# Patient Record
Sex: Female | Born: 1935
Health system: Southern US, Community
[De-identification: ages and names within clinical notes are randomized; demographics above are authoritative.]

## PROBLEM LIST (undated history)

## (undated) DIAGNOSIS — E039 Hypothyroidism, unspecified: Secondary | ICD-10-CM

## (undated) DIAGNOSIS — Z8659 Personal history of other mental and behavioral disorders: Secondary | ICD-10-CM

## (undated) DIAGNOSIS — I10 Essential (primary) hypertension: Secondary | ICD-10-CM

## (undated) DIAGNOSIS — R600 Localized edema: Secondary | ICD-10-CM

## (undated) DIAGNOSIS — F419 Anxiety disorder, unspecified: Secondary | ICD-10-CM

## (undated) HISTORY — PX: EYE SURGERY: SHX253

## (undated) HISTORY — PX: CHOLECYSTECTOMY: SHX55

## (undated) HISTORY — PX: ABDOMINAL HYSTERECTOMY: SHX81

---

## 1999-08-31 ENCOUNTER — Encounter: Admission: RE | Admit: 1999-08-31 | Discharge: 1999-08-31 | Payer: Self-pay | Admitting: Family Medicine

## 1999-08-31 ENCOUNTER — Encounter: Payer: Self-pay | Admitting: Family Medicine

## 2000-09-25 ENCOUNTER — Encounter: Payer: Self-pay | Admitting: Family Medicine

## 2000-09-25 ENCOUNTER — Encounter: Admission: RE | Admit: 2000-09-25 | Discharge: 2000-09-25 | Payer: Self-pay | Admitting: Family Medicine

## 2000-11-18 ENCOUNTER — Other Ambulatory Visit: Admission: RE | Admit: 2000-11-18 | Discharge: 2000-11-18 | Payer: Self-pay | Admitting: Gastroenterology

## 2000-11-18 ENCOUNTER — Encounter (INDEPENDENT_AMBULATORY_CARE_PROVIDER_SITE_OTHER): Payer: Self-pay | Admitting: Specialist

## 2001-11-21 ENCOUNTER — Encounter: Payer: Self-pay | Admitting: Family Medicine

## 2001-11-21 ENCOUNTER — Encounter: Admission: RE | Admit: 2001-11-21 | Discharge: 2001-11-21 | Payer: Self-pay | Admitting: Family Medicine

## 2004-04-12 ENCOUNTER — Other Ambulatory Visit: Admission: RE | Admit: 2004-04-12 | Discharge: 2004-04-12 | Payer: Self-pay | Admitting: Family Medicine

## 2004-09-13 ENCOUNTER — Ambulatory Visit: Payer: Self-pay | Admitting: Family Medicine

## 2004-09-20 ENCOUNTER — Ambulatory Visit: Payer: Self-pay | Admitting: Family Medicine

## 2004-09-22 ENCOUNTER — Ambulatory Visit: Payer: Self-pay | Admitting: Family Medicine

## 2004-09-28 ENCOUNTER — Ambulatory Visit: Payer: Self-pay | Admitting: Family Medicine

## 2004-10-31 ENCOUNTER — Ambulatory Visit: Payer: Self-pay | Admitting: Family Medicine

## 2004-12-21 ENCOUNTER — Ambulatory Visit: Payer: Self-pay | Admitting: Family Medicine

## 2005-04-13 ENCOUNTER — Ambulatory Visit: Payer: Self-pay | Admitting: Family Medicine

## 2005-05-08 ENCOUNTER — Ambulatory Visit: Payer: Self-pay | Admitting: Family Medicine

## 2005-05-24 ENCOUNTER — Ambulatory Visit: Payer: Self-pay | Admitting: Family Medicine

## 2005-06-06 ENCOUNTER — Ambulatory Visit: Payer: Self-pay | Admitting: Family Medicine

## 2005-06-26 ENCOUNTER — Ambulatory Visit: Payer: Self-pay | Admitting: Family Medicine

## 2005-06-28 ENCOUNTER — Ambulatory Visit: Payer: Self-pay | Admitting: Family Medicine

## 2005-08-23 ENCOUNTER — Ambulatory Visit: Payer: Self-pay | Admitting: Family Medicine

## 2005-12-21 ENCOUNTER — Ambulatory Visit: Payer: Self-pay | Admitting: Family Medicine

## 2006-02-12 ENCOUNTER — Ambulatory Visit: Payer: Self-pay | Admitting: Family Medicine

## 2006-06-10 ENCOUNTER — Ambulatory Visit: Payer: Self-pay | Admitting: Family Medicine

## 2006-07-24 ENCOUNTER — Ambulatory Visit: Payer: Self-pay | Admitting: Family Medicine

## 2006-09-17 ENCOUNTER — Ambulatory Visit: Payer: Self-pay | Admitting: Family Medicine

## 2006-09-17 LAB — CONVERTED CEMR LAB: TSH: 1.76 microintl units/mL

## 2006-11-21 ENCOUNTER — Ambulatory Visit: Payer: Self-pay | Admitting: Family Medicine

## 2007-03-20 ENCOUNTER — Ambulatory Visit: Payer: Self-pay | Admitting: Family Medicine

## 2007-03-20 DIAGNOSIS — E039 Hypothyroidism, unspecified: Secondary | ICD-10-CM | POA: Insufficient documentation

## 2007-03-20 DIAGNOSIS — E7800 Pure hypercholesterolemia, unspecified: Secondary | ICD-10-CM | POA: Insufficient documentation

## 2007-03-26 LAB — CONVERTED CEMR LAB
ALT: 14 units/L (ref 0–40)
AST: 21 units/L (ref 0–37)
Cholesterol: 278 mg/dL (ref 0–200)
Direct LDL: 178.8 mg/dL
HDL: 49.2 mg/dL (ref >39.0)
TSH: 2.35 microintl units/mL (ref 0.35–5.50)
Total CHOL/HDL Ratio: 5.7
Triglycerides: 148 mg/dL (ref 0–149)
VLDL: 30 mg/dL (ref 0–40)

## 2007-05-27 ENCOUNTER — Ambulatory Visit: Payer: Self-pay | Admitting: Family Medicine

## 2007-05-28 LAB — CONVERTED CEMR LAB
ALT: 13 units/L (ref 0–35)
AST: 21 units/L (ref 0–37)
Cholesterol: 221 mg/dL (ref 0–200)
Direct LDL: 129.5 mg/dL
HDL: 54.6 mg/dL (ref >39.0)
Total CHOL/HDL Ratio: 4
Triglycerides: 163 mg/dL — ABNORMAL HIGH (ref 0–149)
VLDL: 33 mg/dL (ref 0–40)

## 2007-07-03 ENCOUNTER — Encounter (INDEPENDENT_AMBULATORY_CARE_PROVIDER_SITE_OTHER): Payer: Self-pay | Admitting: *Deleted

## 2007-07-04 ENCOUNTER — Encounter (INDEPENDENT_AMBULATORY_CARE_PROVIDER_SITE_OTHER): Payer: Self-pay | Admitting: Internal Medicine

## 2007-07-04 ENCOUNTER — Telehealth (INDEPENDENT_AMBULATORY_CARE_PROVIDER_SITE_OTHER): Payer: Self-pay | Admitting: Internal Medicine

## 2007-07-04 ENCOUNTER — Ambulatory Visit: Payer: Self-pay | Admitting: Family Medicine

## 2007-07-04 DIAGNOSIS — L989 Disorder of the skin and subcutaneous tissue, unspecified: Secondary | ICD-10-CM | POA: Insufficient documentation

## 2007-07-04 DIAGNOSIS — I498 Other specified cardiac arrhythmias: Secondary | ICD-10-CM

## 2007-07-07 ENCOUNTER — Ambulatory Visit: Payer: Self-pay | Admitting: Cardiology

## 2007-07-16 ENCOUNTER — Telehealth (INDEPENDENT_AMBULATORY_CARE_PROVIDER_SITE_OTHER): Payer: Self-pay | Admitting: Internal Medicine

## 2007-07-21 ENCOUNTER — Encounter: Payer: Self-pay | Admitting: Family Medicine

## 2007-07-21 DIAGNOSIS — M949 Disorder of cartilage, unspecified: Secondary | ICD-10-CM

## 2007-07-21 DIAGNOSIS — F411 Generalized anxiety disorder: Secondary | ICD-10-CM | POA: Insufficient documentation

## 2007-07-21 DIAGNOSIS — M899 Disorder of bone, unspecified: Secondary | ICD-10-CM | POA: Insufficient documentation

## 2007-07-21 DIAGNOSIS — F17200 Nicotine dependence, unspecified, uncomplicated: Secondary | ICD-10-CM

## 2007-07-21 DIAGNOSIS — J309 Allergic rhinitis, unspecified: Secondary | ICD-10-CM | POA: Insufficient documentation

## 2007-07-21 DIAGNOSIS — K219 Gastro-esophageal reflux disease without esophagitis: Secondary | ICD-10-CM

## 2007-07-21 DIAGNOSIS — K5730 Diverticulosis of large intestine without perforation or abscess without bleeding: Secondary | ICD-10-CM | POA: Insufficient documentation

## 2007-07-21 DIAGNOSIS — E785 Hyperlipidemia, unspecified: Secondary | ICD-10-CM | POA: Insufficient documentation

## 2007-07-21 DIAGNOSIS — I10 Essential (primary) hypertension: Secondary | ICD-10-CM | POA: Insufficient documentation

## 2007-07-22 ENCOUNTER — Encounter (INDEPENDENT_AMBULATORY_CARE_PROVIDER_SITE_OTHER): Payer: Self-pay | Admitting: Internal Medicine

## 2007-07-22 ENCOUNTER — Ambulatory Visit: Payer: Self-pay

## 2007-07-31 ENCOUNTER — Ambulatory Visit: Payer: Self-pay | Admitting: Cardiology

## 2007-08-13 ENCOUNTER — Ambulatory Visit: Payer: Self-pay | Admitting: Family Medicine

## 2007-09-04 ENCOUNTER — Encounter: Payer: Self-pay | Admitting: Family Medicine

## 2007-09-10 ENCOUNTER — Encounter (INDEPENDENT_AMBULATORY_CARE_PROVIDER_SITE_OTHER): Payer: Self-pay | Admitting: *Deleted

## 2007-10-30 ENCOUNTER — Ambulatory Visit: Payer: Self-pay | Admitting: Family Medicine

## 2007-10-31 LAB — CONVERTED CEMR LAB
ALT: 14 units/L (ref 0–35)
AST: 20 units/L (ref 0–37)
Albumin: 3.8 g/dL (ref 3.5–5.2)
BUN: 17 mg/dL (ref 6–23)
Basophils Absolute: 0 10*3/uL (ref 0.0–0.1)
Basophils Relative: 0.1 % (ref 0.0–1.0)
CO2: 29 meq/L (ref 19–32)
Calcium: 9.1 mg/dL (ref 8.4–10.5)
Chloride: 98 meq/L (ref 96–112)
Cholesterol: 209 mg/dL (ref 0–200)
Creatinine, Ser: 1.1 mg/dL (ref 0.4–1.2)
Direct LDL: 116.2 mg/dL
Eosinophils Absolute: 0 10*3/uL (ref 0.0–0.6)
Eosinophils Relative: 0.9 % (ref 0.0–5.0)
GFR calc Af Amer: 62 mL/min
GFR calc non Af Amer: 51 mL/min
Glucose, Bld: 115 mg/dL — ABNORMAL HIGH (ref 70–99)
HCT: 40.7 % (ref 36.0–46.0)
HDL: 54.1 mg/dL (ref >39.0)
Hemoglobin: 13.6 g/dL (ref 12.0–15.0)
Lymphocytes Relative: 36.7 % (ref 12.0–46.0)
MCHC: 33.4 g/dL (ref 30.0–36.0)
MCV: 87.3 fL (ref 78.0–100.0)
Monocytes Absolute: 0.7 10*3/uL (ref 0.2–0.7)
Monocytes Relative: 13.2 % — ABNORMAL HIGH (ref 3.0–11.0)
Neutro Abs: 2.8 10*3/uL (ref 1.4–7.7)
Neutrophils Relative %: 49.1 % (ref 43.0–77.0)
Phosphorus: 3.9 mg/dL (ref 2.3–4.6)
Platelets: 205 10*3/uL (ref 150–400)
Potassium: 3.5 meq/L (ref 3.5–5.1)
RBC: 4.67 M/uL (ref 3.87–5.11)
RDW: 13.2 % (ref 11.5–14.6)
Sodium: 136 meq/L (ref 135–145)
TSH: 2.74 microintl units/mL (ref 0.35–5.50)
Total CHOL/HDL Ratio: 3.9
Triglycerides: 170 mg/dL — ABNORMAL HIGH (ref 0–149)
VLDL: 34 mg/dL (ref 0–40)
WBC: 5.5 10*3/uL (ref 4.5–10.5)

## 2007-11-24 ENCOUNTER — Telehealth: Payer: Self-pay | Admitting: Family Medicine

## 2009-10-07 DIAGNOSIS — C4491 Basal cell carcinoma of skin, unspecified: Secondary | ICD-10-CM

## 2009-10-07 HISTORY — DX: Basal cell carcinoma of skin, unspecified: C44.91

## 2011-03-06 NOTE — Assessment & Plan Note (Signed)
[facility] OFFICE NOTE   NAME:Bethany Hicks                           MRN:          [id number]  DATE:07/07/2007                            DOB:          1928-04-15    I was asked by Bethany Hicks to evaluate Bethany Hicks, and to consult on her  irregular heartbeat.   HISTORY OF PRESENT ILLNESS:  Bethany Hicks is a delightful 77 year old  married white female, mother of 5, who comes today for the evaluation of  an irregular heartbeat.  She saw Bethany Hicks in the office for followup  on July 04, 2007.  At that time, she had an EKG which showed her to  be in atrial bigeminy.  There were no other EKG changes noteworthy.  She  is totally asymptomatic.   Her history is pertinent for hypertension and hyperlipidemia being  treated by Bethany Hicks.  She has been under a lot of stress with her  husband being sick, and she says she is just not doing her regular  routine exercise and not eating and drinking well.   She denies any chest discomfort, shortness of breath, palpitations,  presyncope or syncope.  She has had no orthopnea or PND.   PAST MEDICAL HISTORY:  1. She is currently on Synthroid 100 mcg daily.  2. Potassium 10 mEq daily.  3. Hyzaar 100/25 daily.  4. Lipitor 5 mg daily.  5. Cymbalta 30 mg daily.   Recent blood work per the patient and her daughter showed normal thyroid  function, normal potassium.   SHE CLAIMS NO ALLERGIES, THOUGH Bethany Hicks'S REPORT LISTS A WHOLE HOST  OF MEDICATIONS.   SURGICAL HISTORY:  Hysterectomy in 1982.   FAMILY HISTORY:  Remarkable for a brother who had a heart attack at age  58.   SOCIAL HISTORY:  She is retired.  She has 5 children.   REVIEW OF SYSTEMS:  Other than the HPI is negative.  She does have some  anxiety chronically.   The other review of systems are negative.   EXAM:  Her blood pressure is 138/76.  Her pulse is 80 and irregular.  She is in atrial bigeminy by  auscultation.  She is 5 feet 9 inches and  weighs 181 pounds.  Skin is warm and dry.  She is alert and oriented x3.  NEUROLOGIC:  Grossly intact.  HEENT:  Normocephalic, atraumatic, PERRLA.  Extraocular movements  intact.  Sclerae clear.  Facial symmetry is normal.  Carotid upstrokes are equal bilaterally without bruits.  There is no  JVD.  Thyroid is not enlarged.  Trachea is midline.  LUNGS:  Clear.  HEART:  Nondisplaced PMI.  She has a soft systolic murmur on the left  sternal border.  I could not tell if S2 split because of the atrial  bigeminy.  ABDOMEN:  Soft, good bowel sounds, no midline bruit.  EXTREMITIES:  Some varicose veins but no significant edema.  Pulses are  intact.   ASSESSMENT:  1. Atrial bigeminy, totally asymptomatic.  2. Reportedly normal thyroid replacement and  potassium recent blood      check.  3. Hypertension.  4. Hyperlipidemia.   PLAN:  Two-D echocardiogram to check any structural heart disease.  If  this is normal, reassurance will be given.     Thomas C. Daleen Squibb, MD, [facility]  Electronically Signed    TCW/MedQ  DD: 07/07/2007  DT: 07/07/2007  Job #: [id number]   cc:   Bethany D. Bean, FNP

## 2011-03-06 NOTE — Assessment & Plan Note (Signed)
[facility] OFFICE NOTE   NAME:Bethany Hicks, Bethany Hicks                           MRN:          [id number]  DATE:07/31/2007                            DOB:          May 19, 1928    [name] returns today to discuss the findings of her 2D echocardiogram  and to follow up any questions she might have.  She continues to lose  weight and feels better.  Sometimes she gets a little light headed when  she stands up but this is not a major problem.  We reviewed orthostatic  precautions today.  Her 2D echocardiogram was completely normal, EF 55-  60%.  There was no diagnostic wall motion abnormalities that were  obvious.  She had minimal aortic valve thickening and mild tricuspid  regurgitation.   We spent about 20 minutes answering questions that she and her daughter  had.   Her PACs and atrial bigeminy are totally asymptomatic.  It will be  important for health care providers to check her apical pulse so as not  to underestimate her heart rate.  I explained that to her and her  daughter today.   She continues to lose weight.  She may able to be decreased on her blood  pressure medicines.  I would leave this to [facility] discretion.  This may improve her orthostatic symptoms as well.  I will see her back  p.r.n.     Thomas C. Daleen Squibb, MD, [facility]  Electronically Signed    TCW/MedQ  DD: 07/31/2007  DT: 07/31/2007  Job #: [id number]   cc:   Billie D. Bean, FNP

## 2012-05-04 ENCOUNTER — Emergency Department: Payer: Self-pay | Admitting: Emergency Medicine

## 2012-05-04 LAB — CBC
HGB: 12.8 g/dL (ref 12.0–16.0)
MCV: 87 fL (ref 80–100)
Platelet: 167 10*3/uL (ref 150–440)
RDW: 14.3 % (ref 11.5–14.5)

## 2012-05-04 LAB — URINALYSIS, COMPLETE
Bacteria: NONE SEEN
Glucose,UR: NEGATIVE mg/dL (ref 0–75)
Leukocyte Esterase: NEGATIVE
Nitrite: NEGATIVE
WBC UR: NONE SEEN /HPF (ref 0–5)

## 2012-05-04 LAB — COMPREHENSIVE METABOLIC PANEL
Anion Gap: 13 (ref 7–16)
Bilirubin,Total: 0.6 mg/dL (ref 0.2–1.0)
Chloride: 100 mmol/L (ref 98–107)
Co2: 25 mmol/L (ref 21–32)
Creatinine: 1.13 mg/dL (ref 0.60–1.30)
EGFR (African American): 53 — ABNORMAL LOW
SGOT(AST): 30 U/L (ref 15–37)
SGPT (ALT): 23 U/L

## 2012-05-04 LAB — MAGNESIUM: Magnesium: 1.6 mg/dL — ABNORMAL LOW

## 2012-05-04 LAB — TROPONIN I: Troponin-I: <0.02 ng/mL

## 2012-05-05 ENCOUNTER — Inpatient Hospital Stay: Payer: Self-pay | Admitting: Surgery

## 2012-05-05 LAB — APTT: Activated PTT: 35.4 secs (ref 23.6–35.9)

## 2012-05-05 LAB — PROTIME-INR
INR: 1.1
Prothrombin Time: 14.8 secs — ABNORMAL HIGH (ref 11.5–14.7)

## 2013-10-03 ENCOUNTER — Encounter: Payer: Self-pay | Admitting: Gastroenterology

## 2014-05-06 ENCOUNTER — Encounter: Payer: Self-pay | Admitting: Gastroenterology

## 2015-01-20 ENCOUNTER — Ambulatory Visit
Admit: 2015-01-20 | Disposition: A | Discharge status: Home / Self Care | Payer: Self-pay | Attending: Ophthalmology | Admitting: Ophthalmology

## 2015-01-20 LAB — POTASSIUM: Potassium: 2.9 mmol/L — ABNORMAL LOW

## 2015-01-31 ENCOUNTER — Ambulatory Visit
Admit: 2015-01-31 | Disposition: A | Discharge status: Home / Self Care | Payer: Self-pay | Attending: Ophthalmology | Admitting: Ophthalmology

## 2015-02-08 NOTE — H&P (Signed)
PATIENT NAME:  Bethany Hicks, Bethany Hicks MR#:  [id number] DATE OF BIRTH:  1928/08/17  DATE OF ADMISSION:  05/05/2012  HISTORY OF PRESENT ILLNESS: 81 year old female presents with significant right upper quadrant pain. She was in her usual state of health until over the weekend. After eating some pork barbecue and having fried apple pie with ice cream she began experiencing right upper quadrant pain. The pain persisted and was progressive. She went to the Emergency Department where right upper quadrant ultrasound showed significant gallstones but no gallbladder wall thickening. Her white count was 11.9. She presented to the office today with persistent symptoms of pain in her abdomen with some nausea. No fever or chills. CT of the abdomen this evening shows significant cholecystitis. No signs of appendicitis. White count stable at 10.9. She will be admitted for further evaluation and treatment.   PAST MEDICAL HISTORY:  1. Hypertension.   2. Hyperlipidemia.  3. Hypothyroidism.   PAST SURGICAL HISTORY:  Hysterectomy.   ALLERGIES: Codeine.   MEDICATIONS:  1. Synthroid 100 mcg daily.  2. Sertraline 50 mg daily.  3. Potassium chloride 20 mEq daily.  4. Losartan/HCT 100/25 daily.   SOCIAL HISTORY: Widowed. Enjoys sewing.   FAMILY HISTORY: Father died of colon cancer.  REVIEW OF SYSTEMS: Otherwise negative.   PHYSICAL EXAMINATION:  VITAL SIGNS: Blood pressure 102/60, afebrile, pulse 84, weight 191, 66 inches.   HEENT: Normal TMs.   NECK: No thyromegaly or bruits.   LUNGS: Clear to auscultation and percussion.   HEART: Regular rhythm.   ABDOMEN: Soft, moderately tender right lower and right upper quadrant, questionable rebound.   EXTREMITIES: No edema.   NEUROLOGIC: Grossly nonfocal.   LABS: White count 10.9 thousand. CT of the abdomen shows significant cholecystitis.   ASSESSMENT AND PLAN:  1. Acute cholecystitis:  IV fluids. Morphine p.r.n. Surgical consultation. No clear antibiotics indicated  unless surgery thinks it is warranted.  2. Hypothyroid: Continue replacement.   3. Hypertension: With infection hold losartan at this point.  ____________________________ Rusty Aus, MD mfm:bjt D: 05/05/2012 17:54:37 ET T: 05/06/2012 07:23:05 ET JOB#: [id number]  cc: Rusty Aus, MD, <Dictator> Hemi Chacko Roselee Culver MD ELECTRONICALLY SIGNED 05/07/2012 8:11

## 2015-02-08 NOTE — Op Note (Signed)
PATIENT NAME:  Bethany Hicks, Bethany Hicks MR#:  [id number] DATE OF BIRTH:  07-05-1928  DATE OF PROCEDURE:  05/06/2012  PREOPERATIVE DIAGNOSIS: Acute cholecystitis, cholelithiasis.   POSTOPERATIVE DIAGNOSIS: Acute cholecystitis, cholelithiasis.   PROCEDURE: Laparoscopic cholecystectomy.   SURGEON: Loreli Dollar, MD  ANESTHESIA: General.   INDICATIONS: This 81 year old female came in with epigastric pain and ultrasound findings of gallstones, CT findings of cholecystitis. She had right upper quadrant tenderness. Surgery was recommended for definitive treatment.   DESCRIPTION OF PROCEDURE: The patient was placed on the operating table in the supine position under general endotracheal anesthesia. The abdomen was prepared with ChloraPrep, draped in a sterile manner.   A short incision was made in the inferior aspect of the umbilicus and carried down to the deep fascia which was grasped with laryngeal hook and elevated. A Veress needle was inserted, aspirated, and irrigated with a saline solution. Next, the peritoneal cavity was inflated with carbon dioxide. The Veress needle was removed. The 10 mm cannula was inserted. The 10 mm, 0 degree laparoscope was inserted to view the peritoneal cavity. There was phlegmon in the right upper quadrant. The liver appeared normal. There were some adhesions between the omentum and the lower abdominal wall.   Another incision was made in the epigastrium slightly to the right of the midline to introduce an 11 mm cannula. Two incisions were made in the lateral aspect of the right upper quadrant to introduce two 5 mm cannulas.   The phlegmon was peeled away from the gallbladder exposing an acutely inflamed hemorrhagic-appearing gallbladder. It was decompressed with a lancing needle and then retracted towards the right shoulder. Additional phlegmon was peeled away from the gallbladder. The infundibulum was retracted inferiorly and laterally. The dissection was carried out to isolate  the cystic duct and the cystic artery from surrounding tissues. The neck of the gallbladder was mobilized with incision of the visceral peritoneum. A critical view of safety was demonstrated. An endoclip was placed across the cystic duct adjacent to the neck of the gallbladder. An incision was made in the cystic duct to introduce a Reddick catheter, however, the cystic duct was found to be very small and the Reddick catheter would not thread in, therefore cholangiogram was not done. The Reddick catheter was removed. The cystic duct was doubly ligated with endoclips and divided. The cystic artery was controlled with double endoclips and divided. Another small branch was controlled with a single endoclip and divided. The gallbladder was dissected free from the liver. There was edema in the wall of the gallbladder. Gallbladder was completely separated. The site was irrigated with heparinized saline solution and aspirated. Hemostasis was intact. The gallbladder was placed into an Endo Catch bag, brought up through the infraumbilical port site. It was necessary to lengthen the incision to approximately 2 cm and also lengthen the fascial incision and the gallbladder was removed and submitted in formalin for routine pathology. The right upper quadrant was further inspected, irrigated, and aspirated. Hemostasis was intact. The cannulas were removed. Carbon dioxide allowed to escape from the peritoneal cavity. The fascial defect at the umbilicus was closed with 0 Maxon figure-of-eight suture. The skin incisions were closed with interrupted 5-0 chromic subcuticular sutures, benzoin, and Steri-Strips. Dressings were applied with paper tape. The patient tolerated surgery satisfactorily and was then prepared for transfer to the recovery room.  ____________________________ Lenna Sciara. Rochel Brome, MD jws:cms D: 05/06/2012 13:34:11 ET T: 05/06/2012 13:57:23 ET JOB#: [id number]  cc: Loreli Dollar, MD, <Dictator> Loreli Dollar  MD ELECTRONICALLY SIGNED 05/09/2012 20:36

## 2015-02-08 NOTE — Consult Note (Signed)
PATIENT NAME:  Bethany Hicks, Bethany Hicks MR#:  [id number] DATE OF BIRTH:  11-09-1927  DATE OF CONSULTATION:  05/05/2012  SURGERY HISTORY AND PHYSICAL/ CONSULTATION  REFERRING PHYSICIAN:   CONSULTING PHYSICIAN:  Loreli Dollar, MD  HISTORY OF PRESENT ILLNESS: This 81 year old female came into the hospital with a chief complaint of abdominal pain. She was well until some two days ago when she developed some nausea and vomiting and later developed some right upper abdominal discomfort. She was evaluated with ultrasound which demonstrated gallstones and today due to persistent discomfort had a CT scan which demonstrated evidence of fat stranding around the gallbladder. She has continued discomfort. Movement aggravates her pain. She reports improvement in her nausea, has taken some clear liquids this evening which she tolerated well. She reports no chills or fever. She reports no history of jaundice.   PAST MEDICAL HISTORY:  1. Hypertension.  2. Hyperlipidemia.  3. Depression and anxiety.  4. Hypothyroidism.   PAST SURGICAL HISTORY: Hysterectomy in 1982. She had a transversely oriented lower abdominal suprapubic incision. Also reports she had her appendix removed at that time.   MEDICATIONS:  1. Synthroid 100 mcg daily.  2. Losartan with hydrochlorothiazide 100/25 daily.  3. Sertraline 50 mg at bedtime. 4. Klor-Con 10 mEq 2 tablets daily.  5. Vitamin D capsule 2000 units daily.   ALLERGIES: Lisinopril and Cipro.   SOCIAL HISTORY: She stopped smoking more than 30 years ago, does not smoke now. Does not drink any alcohol.   FAMILY HISTORY: Positive for heart disease and colon cancer.   She had a colonoscopy in 2005 which was normal.   REVIEW OF SYSTEMS: She reports she is fairly active. She uses a stationary bicycle for exercise, does do a fair amount of walking. She reports no recent visual or auditory changes. She reports no recent acute illness such as cough, cold, or sore throat. No chest pain. No  difficulty breathing. No heartburn. She thinks she may have had a yeast infection in the perineal area a number of months ago which resolved. She reports no more recent sores or boils. She reports no ankle edema. She has been moving her bowels satisfactorily and voiding satisfactorily, has had some recent hip pain and was treated with an injection for apparent bursitis and it improved. Review of systems otherwise negative.   PHYSICAL EXAMINATION:  GENERAL: She is awake, alert, and oriented, resting in the hospital bed.   VITAL SIGNS: Temperature 98, pulse 83, respirations 18, blood pressure 138/83, pulse is 94.   SKIN: Warm and dry without jaundice.   HEENT: Pupils equal, reactive to light. Extraocular movements are intact. Sclerae clear. Pharynx clear.   NECK: No palpable mass.   LUNGS: Lungs sounds were clear.   HEART: Regular rhythm, S1 and S2 without murmur.   ABDOMEN: Moderate right upper quadrant tenderness. No tenderness elsewhere in the abdomen. No hepatomegaly. No palpable mass.   EXTREMITIES: No dependent edema.   NEUROLOGIC: Awake, alert, oriented, moving all extremities.  LABORATORY DATA: Lab work was done on 05/05/2012. White blood count 10,900, hemoglobin 12.6. Comprehensive metabolic panel with a creatinine of 1.2, glucose 113, total bilirubin 1. Urinalysis on 07/14 was with no bacteria seen. EKG on the fourteenth was normal. Chest x-ray on the fourteenth was with mild interstitial prominence, most likely interstitial fibrosis.   Her ultrasound on the fourteenth demonstrated gallstones with gallbladder wall 1.9-mm thickness, common bile duct was 3.5 mm in diameter. CT scan done shortly prior to admission concerning for acute cholecystitis.  There is pericholecystic inflammation, gallstones stranding around the gallbladder.   IMPRESSION: Acute cholecystitis, cholelithiasis.  RECOMMENDATIONS:  I recommended admission to the hospital and laparoscopic cholecystectomy. I  discussed the operation, care, risks, and benefits with her in detail. We will keep her n.p.o. past midnight and anticipate her surgery tomorrow.   ____________________________ Lenna Sciara. Rochel Brome, MD jws:bjt D: 05/05/2012 20:30:47 ET T: 05/06/2012 11:32:18 ET JOB#: [id number]  cc: Loreli Dollar, MD, <Dictator> Loreli Dollar MD ELECTRONICALLY SIGNED 05/10/2012 13:59

## 2015-02-20 NOTE — Op Note (Signed)
PATIENT NAME:  Bethany Hicks, DELAUTER MR#:  [id number] DATE OF BIRTH:  [birth year]  DATE OF PROCEDURE:  01/31/2015  PREOPERATIVE DIAGNOSIS:  Nuclear sclerotic cataract, right eye.  POSTOPERATIVE DIAGNOSIS:  Nuclear sclerotic cataract, right eye.  PROCEDURE PERFORMED:  Extracapsular cataract extraction using phacoemulsification with placement of an Alcon [id number], 24.0-diopter posterior chamber lens, serial [id number].  SURGEON:  Loura Back. Aubrianne Molyneux, MD  ASSISTANT:  None.  ANESTHESIA:  4% lidocaine and 0.75% Marcaine in a 50/50 mixture with 10 units per mL of Hylenex added, given as a peribulbar.  ANESTHESIOLOGIST:  Dr. Benjamine Mola.    COMPLICATIONS:  None.  ESTIMATED BLOOD LOSS:  Less than 1 ml.  DESCRIPTION OF PROCEDURE:  The patient was brought to the operating room and given a peribulbar block.  The patient was then prepped and draped in the usual fashion.  The vertical rectus muscles were imbricated using 5-0 silk sutures.  These sutures were then clamped to the sterile drapes as bridle sutures.  A limbal peritomy was performed extending two clock hours and hemostasis was obtained with cautery.  A partial thickness scleral groove was made at the surgical limbus and then dissected anteriorly in a lamellar dissection with using an Alcon crescent knife.  The anterior chamber was entered superonasally with a Superblade and through the lamellar dissection with a 2.6-mm keratome.  DisCoVisc was used to replace the aqueous and a continuous tear capsulorrhexis was carried out.  Hydrodissection and hydrodelineation were carried out with balanced salt and a 27 gauge canula.  The nucleus was rotated to confirm the effectiveness of the hydrodissection.  Phacoemulsification was carried out using a divide-and-conquer technique.  Total ultrasound time was 1 minute and 23 seconds with an average power of 25.1 percent. CDE of 37.28.    Irrigation/aspiration was used to remove the residual cortex.  DisCoVisc was used to  inflate the capsule and the internal wound was enlarged to 3 mm with the crescent knife.  The intraocular lens was inserted into the capsular bag using the AcrySert delivery system.  Irrigation/aspiration was used to remove the residual DisCoVisc.  Miostat was injected into the anterior chamber through the paracentesis track to inflate the anterior chamber and induce miosis.  0.1 mL of cefuroxime containing 1 mg of drug was injected via the paracentesis track.  The wound was checked for leaks and wound leakage was found.  A single 10-0 suture was placed across the incision, tied and the knot was rotated superiorly.  The conjunctiva was closed with cautery and the bridle sutures were removed.  Two drops of 0.3% Vigamox were placed on the eye.  An eye shield was placed on the eye.  The patient was discharged to the recovery room in good condition.    ____________________________ Loura Back Logun Colavito, MD sad:bu D: 01/31/2015 12:58:56 ET T: 01/31/2015 17:13:20 ET JOB#: [id number]  cc: Remo Lipps A. Jezabella Schriever, MD, <Dictator> Martie Lee MD ELECTRONICALLY SIGNED 02/07/2015 13:24

## 2015-02-23 ENCOUNTER — Encounter
Admission: RE | Admit: 2015-02-23 | Discharge: 2015-02-23 | Disposition: A | Discharge status: Home / Self Care | Payer: PPO | Source: Ambulatory Visit | Attending: Ophthalmology | Admitting: Ophthalmology

## 2015-02-23 DIAGNOSIS — Z87898 Personal history of other specified conditions: Secondary | ICD-10-CM | POA: Insufficient documentation

## 2015-02-23 DIAGNOSIS — Z8349 Family history of other endocrine, nutritional and metabolic diseases: Secondary | ICD-10-CM | POA: Insufficient documentation

## 2015-02-23 DIAGNOSIS — H2512 Age-related nuclear cataract, left eye: Secondary | ICD-10-CM | POA: Insufficient documentation

## 2015-02-23 DIAGNOSIS — K219 Gastro-esophageal reflux disease without esophagitis: Secondary | ICD-10-CM | POA: Insufficient documentation

## 2015-02-23 DIAGNOSIS — F419 Anxiety disorder, unspecified: Secondary | ICD-10-CM | POA: Insufficient documentation

## 2015-02-23 DIAGNOSIS — Z809 Family history of malignant neoplasm, unspecified: Secondary | ICD-10-CM | POA: Diagnosis not present

## 2015-02-23 DIAGNOSIS — R5383 Other fatigue: Secondary | ICD-10-CM | POA: Diagnosis not present

## 2015-02-23 DIAGNOSIS — F329 Major depressive disorder, single episode, unspecified: Secondary | ICD-10-CM | POA: Insufficient documentation

## 2015-02-23 DIAGNOSIS — Z882 Allergy status to sulfonamides status: Secondary | ICD-10-CM | POA: Insufficient documentation

## 2015-02-23 DIAGNOSIS — Z8249 Family history of ischemic heart disease and other diseases of the circulatory system: Secondary | ICD-10-CM | POA: Diagnosis not present

## 2015-02-23 DIAGNOSIS — Z885 Allergy status to narcotic agent status: Secondary | ICD-10-CM | POA: Diagnosis not present

## 2015-02-23 DIAGNOSIS — N200 Calculus of kidney: Secondary | ICD-10-CM | POA: Insufficient documentation

## 2015-02-23 DIAGNOSIS — Z01812 Encounter for preprocedural laboratory examination: Secondary | ICD-10-CM | POA: Insufficient documentation

## 2015-02-23 DIAGNOSIS — Z91040 Latex allergy status: Secondary | ICD-10-CM | POA: Insufficient documentation

## 2015-02-23 LAB — POTASSIUM: Potassium: 3.5 mmol/L (ref 3.5–5.1)

## 2015-03-01 ENCOUNTER — Encounter: Payer: Self-pay | Admitting: *Deleted

## 2015-03-01 DIAGNOSIS — I10 Essential (primary) hypertension: Secondary | ICD-10-CM | POA: Diagnosis not present

## 2015-03-01 DIAGNOSIS — E079 Disorder of thyroid, unspecified: Secondary | ICD-10-CM | POA: Diagnosis not present

## 2015-03-01 DIAGNOSIS — Z9049 Acquired absence of other specified parts of digestive tract: Secondary | ICD-10-CM | POA: Diagnosis not present

## 2015-03-01 DIAGNOSIS — E780 Pure hypercholesterolemia: Secondary | ICD-10-CM | POA: Diagnosis not present

## 2015-03-01 DIAGNOSIS — Z9849 Cataract extraction status, unspecified eye: Secondary | ICD-10-CM | POA: Diagnosis not present

## 2015-03-01 DIAGNOSIS — Z90710 Acquired absence of both cervix and uterus: Secondary | ICD-10-CM | POA: Diagnosis not present

## 2015-03-01 DIAGNOSIS — Z87891 Personal history of nicotine dependence: Secondary | ICD-10-CM | POA: Diagnosis not present

## 2015-03-01 DIAGNOSIS — H2512 Age-related nuclear cataract, left eye: Secondary | ICD-10-CM | POA: Diagnosis not present

## 2015-03-01 DIAGNOSIS — H269 Unspecified cataract: Secondary | ICD-10-CM | POA: Diagnosis present

## 2015-03-01 DIAGNOSIS — Z79899 Other long term (current) drug therapy: Secondary | ICD-10-CM | POA: Diagnosis not present

## 2015-03-01 DIAGNOSIS — F419 Anxiety disorder, unspecified: Secondary | ICD-10-CM | POA: Diagnosis not present

## 2015-03-01 DIAGNOSIS — Z885 Allergy status to narcotic agent status: Secondary | ICD-10-CM | POA: Diagnosis not present

## 2015-03-07 ENCOUNTER — Ambulatory Visit
Admission: RE | Admit: 2015-03-07 | Discharge: 2015-03-07 | Disposition: A | Discharge status: Home / Self Care | Payer: PPO | Source: Ambulatory Visit | Attending: Ophthalmology | Admitting: Ophthalmology

## 2015-03-07 ENCOUNTER — Encounter
Admission: RE | Disposition: A | Discharge status: Home / Self Care | Payer: Self-pay | Source: Ambulatory Visit | Attending: Ophthalmology

## 2015-03-07 ENCOUNTER — Ambulatory Visit: Payer: PPO | Admitting: Anesthesiology

## 2015-03-07 DIAGNOSIS — H2512 Age-related nuclear cataract, left eye: Secondary | ICD-10-CM | POA: Insufficient documentation

## 2015-03-07 DIAGNOSIS — Z79899 Other long term (current) drug therapy: Secondary | ICD-10-CM | POA: Insufficient documentation

## 2015-03-07 DIAGNOSIS — Z9849 Cataract extraction status, unspecified eye: Secondary | ICD-10-CM | POA: Insufficient documentation

## 2015-03-07 DIAGNOSIS — Z885 Allergy status to narcotic agent status: Secondary | ICD-10-CM | POA: Insufficient documentation

## 2015-03-07 DIAGNOSIS — Z90710 Acquired absence of both cervix and uterus: Secondary | ICD-10-CM | POA: Insufficient documentation

## 2015-03-07 DIAGNOSIS — E079 Disorder of thyroid, unspecified: Secondary | ICD-10-CM | POA: Insufficient documentation

## 2015-03-07 DIAGNOSIS — Z9049 Acquired absence of other specified parts of digestive tract: Secondary | ICD-10-CM | POA: Insufficient documentation

## 2015-03-07 DIAGNOSIS — E780 Pure hypercholesterolemia: Secondary | ICD-10-CM | POA: Insufficient documentation

## 2015-03-07 DIAGNOSIS — I10 Essential (primary) hypertension: Secondary | ICD-10-CM | POA: Insufficient documentation

## 2015-03-07 DIAGNOSIS — Z87891 Personal history of nicotine dependence: Secondary | ICD-10-CM | POA: Insufficient documentation

## 2015-03-07 DIAGNOSIS — F419 Anxiety disorder, unspecified: Secondary | ICD-10-CM | POA: Insufficient documentation

## 2015-03-07 HISTORY — DX: Anxiety disorder, unspecified: F41.9

## 2015-03-07 HISTORY — PX: CATARACT EXTRACTION W/PHACO: SHX586

## 2015-03-07 HISTORY — DX: Essential (primary) hypertension: I10

## 2015-03-07 HISTORY — DX: Hypothyroidism, unspecified: E03.9

## 2015-03-07 HISTORY — DX: Personal history of other mental and behavioral disorders: Z86.59

## 2015-03-07 HISTORY — DX: Localized edema: R60.0

## 2015-03-07 SURGERY — PHACOEMULSIFICATION, CATARACT, WITH IOL INSERTION
Anesthesia: Monitor Anesthesia Care | Laterality: Left

## 2015-03-07 MED ORDER — CYCLOPENTOLATE HCL 2 % OP SOLN
1.0000 [drp] | OPHTHALMIC | Status: AC
  Administered 2015-03-07 (×4): 1 [drp] via OPHTHALMIC

## 2015-03-07 MED ORDER — MOXIFLOXACIN HCL 0.5 % OP SOLN - NO CHARGE
OPHTHALMIC | Status: DC | PRN
  Administered 2015-03-07: 1 [drp] via OPHTHALMIC

## 2015-03-07 MED ORDER — SODIUM CHLORIDE 0.9 % IV SOLN
INTRAVENOUS | Status: DC
  Administered 2015-03-07 (×2): via INTRAVENOUS

## 2015-03-07 MED ORDER — TETRACAINE HCL 0.5 % OP SOLN
OPHTHALMIC | Status: DC | PRN
  Administered 2015-03-07: 1 [drp] via OPHTHALMIC

## 2015-03-07 MED ORDER — MOXIFLOXACIN HCL 0.5 % OP SOLN
1.0000 [drp] | OPHTHALMIC | Status: AC
  Administered 2015-03-07 (×3): 1 [drp] via OPHTHALMIC

## 2015-03-07 MED ORDER — HYALURONIDASE HUMAN 150 UNIT/ML IJ SOLN
INTRAMUSCULAR | Status: AC
  Filled 2015-03-07: qty 1

## 2015-03-07 MED ORDER — CYCLOPENTOLATE HCL 2 % OP SOLN
OPHTHALMIC | Status: AC
  Administered 2015-03-07: 1 [drp] via OPHTHALMIC
  Filled 2015-03-07: qty 2

## 2015-03-07 MED ORDER — NA CHONDROIT SULF-NA HYALURON 40-17 MG/ML IO SOLN
INTRAOCULAR | Status: DC | PRN
  Administered 2015-03-07: 1 mL via INTRAOCULAR

## 2015-03-07 MED ORDER — LIDOCAINE HCL (PF) 4 % IJ SOLN
INTRAMUSCULAR | Status: AC
  Filled 2015-03-07: qty 5

## 2015-03-07 MED ORDER — EPINEPHRINE HCL 1 MG/ML IJ SOLN
INTRAMUSCULAR | Status: AC
  Filled 2015-03-07: qty 1

## 2015-03-07 MED ORDER — LIDOCAINE HCL (PF) 4 % IJ SOLN
INTRAMUSCULAR | Status: DC | PRN
  Administered 2015-03-07: 5 mL via OPHTHALMIC

## 2015-03-07 MED ORDER — MIDAZOLAM HCL 2 MG/2ML IJ SOLN
INTRAMUSCULAR | Status: DC | PRN
  Administered 2015-03-07: 1 mg via INTRAVENOUS

## 2015-03-07 MED ORDER — BUPIVACAINE HCL (PF) 0.75 % IJ SOLN
INTRAMUSCULAR | Status: AC
  Filled 2015-03-07: qty 10

## 2015-03-07 MED ORDER — PHENYLEPHRINE HCL 10 % OP SOLN
1.0000 [drp] | OPHTHALMIC | Status: AC
  Administered 2015-03-07 (×4): 1 [drp] via OPHTHALMIC

## 2015-03-07 MED ORDER — NA CHONDROIT SULF-NA HYALURON 40-17 MG/ML IO SOLN
INTRAOCULAR | Status: AC
  Filled 2015-03-07: qty 1

## 2015-03-07 MED ORDER — PHENYLEPHRINE HCL 10 % OP SOLN
OPHTHALMIC | Status: AC
  Administered 2015-03-07: 1 [drp] via OPHTHALMIC
  Filled 2015-03-07: qty 5

## 2015-03-07 MED ORDER — MOXIFLOXACIN HCL 0.5 % OP SOLN
OPHTHALMIC | Status: AC
  Administered 2015-03-07: 1 [drp] via OPHTHALMIC
  Filled 2015-03-07: qty 3

## 2015-03-07 MED ORDER — EPINEPHRINE HCL 1 MG/ML IJ SOLN
INTRAOCULAR | Status: DC | PRN
  Administered 2015-03-07: 200 mL

## 2015-03-07 MED ORDER — CARBACHOL 0.01 % IO SOLN
INTRAOCULAR | Status: DC | PRN
  Administered 2015-03-07: 0.5 mL via INTRAOCULAR

## 2015-03-07 MED ORDER — CEFUROXIME OPHTHALMIC INJECTION 1 MG/0.1 ML
INJECTION | OPHTHALMIC | Status: AC
  Filled 2015-03-07: qty 0.1

## 2015-03-07 MED ORDER — TETRACAINE HCL 0.5 % OP SOLN
OPHTHALMIC | Status: AC
  Filled 2015-03-07: qty 2

## 2015-03-07 MED ORDER — ALFENTANIL 500 MCG/ML IJ INJ
INJECTION | INTRAMUSCULAR | Status: DC | PRN
  Administered 2015-03-07: 500 ug via INTRAVENOUS

## 2015-03-07 SURGICAL SUPPLY — 27 items
ACTIVE FMS ×1 IMPLANT
CORD BIP STRL DISP 12FT (MISCELLANEOUS) ×2 IMPLANT
DRAPE XRAY CASSETTE 23X24 (DRAPES) ×2 IMPLANT
ERASER HMR WETFIELD 18G (MISCELLANEOUS) ×2 IMPLANT
GLOVE BIO SURGEON STRL SZ8 (GLOVE) ×2 IMPLANT
GLOVE SURG LX 6.5 MICRO (GLOVE) ×1
GLOVE SURG LX 8.0 MICRO (GLOVE) ×1
GLOVE SURG LX STRL 6.5 MICRO (GLOVE) ×1 IMPLANT
GLOVE SURG LX STRL 8.0 MICRO (GLOVE) ×1 IMPLANT
GOWN STRL REUS W/ TWL LRG LVL3 (GOWN DISPOSABLE) ×1 IMPLANT
GOWN STRL REUS W/ TWL XL LVL3 (GOWN DISPOSABLE) ×1 IMPLANT
GOWN STRL REUS W/TWL LRG LVL3 (GOWN DISPOSABLE) ×2
GOWN STRL REUS W/TWL XL LVL3 (GOWN DISPOSABLE) ×2
LENS IOL ACRYSERT 24.5 (Intraocular Lens) ×1 IMPLANT
PACK CATARACT (MISCELLANEOUS) ×2 IMPLANT
PACK CATARACT DINGLEDEIN LX (MISCELLANEOUS) ×2 IMPLANT
PACK EYE AFTER SURG (MISCELLANEOUS) ×2 IMPLANT
SHLD EYE VISITEC  UNIV (MISCELLANEOUS) ×2 IMPLANT
SN6CWS24.5 CATARACT LENS ×1 IMPLANT
SOL PREP PVP 2OZ (MISCELLANEOUS) ×2
SOLUTION PREP PVP 2OZ (MISCELLANEOUS) ×1 IMPLANT
SUT SILK 5-0 (SUTURE) ×2 IMPLANT
SYR 5ML LL (SYRINGE) ×2 IMPLANT
SYR TB 1ML 27GX1/2 LL (SYRINGE) ×2 IMPLANT
WATER STERILE IRR 1000ML POUR (IV SOLUTION) ×2 IMPLANT
WIPE NON LINTING 3.25X3.25 (MISCELLANEOUS) ×2 IMPLANT
sn6cws24.5 ×1 IMPLANT

## 2015-03-07 NOTE — Op Note (Signed)
Date of Surgery: 03/07/2015 Date of Dictation: 03/07/2015 9:01 AM Pre-operative Diagnosis:Nuclear Sclerotic Cataract, Posterior Subcapsular Cataract, Cortical Cataract and Mature Cataract left Eye Post-operative Diagnosis: same Procedure performed: Extra-capsular Cataract Extraction (ECCE) with placement of a posterior chamber intraocular lens (IOL) left Eye IOL:  Implant Name Type Inv. Item Serial No. Manufacturer Lot No. LRB No. Used  [id number]     [id number]     Left 1   Anesthesia: 2% Lidocaine and 4% Marcaine in a 50/50 mixture with 10 unites/ml of Hylenex given as a peribulbar Anesthesiologist: Anesthesiologist: Molli Barrows, MD CRNA: Bernardo Heater, CRNA Complications: none Estimated Blood Loss: less than 1 ml  Description of procedure:  The patient was given anesthesia and sedation via intravenous access. The patient was then prepped and draped in the usual fashion. A 25-gauge needle was bent for initiating the capsulorhexis. A 5-0 silk suture was placed through the conjunctiva superior and inferiorly to serve as bridle sutures. Hemostasis was obtained at the superior limbus using an eraser cautery. A partial thickness groove was made at the anterior surgical limbus with a 64 Beaver blade and this was dissected anteriorly with an [employer]. The anterior chamber was entered at 10 o'clock with a 1.0 mm paracentesis knife and through the lamellar dissection with a 2.6 mm Alcon keratome. DiscoVisc was injected to replace the aqueous and a continuous tear curvilinear capsulorhexis was performed using a bent 25-gauge needle.  Balance salt on a syringe was used to perform hydro-dissection and phacoemulsification was carried out using a divide and conquer technique. Procedure(s) with comments: CATARACT EXTRACTION PHACO AND INTRAOCULAR LENS PLACEMENT (IOC) (Left) - [place] 00:57 AP% 24.6 CDE 24.44. Irrigation/aspiration was used to remove the residual cortex and the capsular bag was  inflated with DiscoVisc. The intraocular lens was inserted into the capsular bag using a pre-loaded Acrysert Delivery System. Irrigation/aspiration was used to remove the residual DiscoVisc. The wound was inflated with balanced salt and checked for leaks. None were found. Miostat was injected via the paracentesis track and 0.1 ml of cefuroxime containing 1 mg of drug  was injected via the paracentesis track. The wound was checked for leaks again and none were found.   The bridal sutures were removed and two drops of Vigamox were placed on the eye. An eye shield was placed to protect the eye and the patient was discharged to the recovery area in good condition.   Tinslee Klare MD

## 2015-03-07 NOTE — Anesthesia Postprocedure Evaluation (Signed)
  Anesthesia Post-op Note  Patient: Bethany Hicks  Procedure(s) Performed: Procedure(s) with comments: CATARACT EXTRACTION PHACO AND INTRAOCULAR LENS PLACEMENT (IOC) (Left) - [place] 00:57 AP% 24.6 CDE 24.44  Anesthesia type:MAC  Patient location: PACU  Post pain: Pain level controlled  Post assessment: Post-op Vital signs reviewed, Patient's Cardiovascular Status Stable, Respiratory Function Stable, Patent Airway and No signs of Nausea or vomiting  Post vital signs: Reviewed and stable  Last Vitals:  Filed Vitals:   03/07/15 0921  BP: 130/62  Pulse: 56  Temp:   Resp: 16    Level of consciousness: awake, alert  and patient cooperative  Complications: No apparent anesthesia complications

## 2015-03-07 NOTE — Op Note (Signed)
Date of Surgery: 03/07/2015 Date of Dictation: 03/07/2015 9:03 AM Pre-operative Diagnosis:Nuclear Sclerotic Cataract, Posterior Subcapsular Cataract, Cortical Cataract and Mature Cataract left Eye Post-operative Diagnosis: same Procedure performed: Extra-capsular Cataract Extraction (ECCE) with placement of a posterior chamber intraocular lens (IOL) left Eye IOL:  Implant Name Type Inv. Item Serial No. Manufacturer Lot No. LRB No. Used  [id number]     [id number]     Left 1   Anesthesia: 2% Lidocaine and 4% Marcaine in a 50/50 mixture with 10 unites/ml of Hylenex given as a peribulbar Anesthesiologist: Anesthesiologist: Molli Barrows, MD CRNA: Bernardo Heater, CRNA Complications: none Estimated Blood Loss: less than 1 ml  Description of procedure:  The patient was given anesthesia and sedation via intravenous access. The patient was then prepped and draped in the usual fashion. A 25-gauge needle was bent for initiating the capsulorhexis. A 5-0 silk suture was placed through the conjunctiva superior and inferiorly to serve as bridle sutures. Hemostasis was obtained at the superior limbus using an eraser cautery. A partial thickness groove was made at the anterior surgical limbus with a 64 Beaver blade and this was dissected anteriorly with an [employer]. The anterior chamber was entered at 10 o'clock with a 1.0 mm paracentesis knife and through the lamellar dissection with a 2.6 mm Alcon keratome. DiscoVisc was injected to replace the aqueous and a continuous tear curvilinear capsulorhexis was performed using a bent 25-gauge needle.  Balance salt on a syringe was used to perform hydro-dissection and phacoemulsification was carried out using a divide and conquer technique. Procedure(s) with comments: CATARACT EXTRACTION PHACO AND INTRAOCULAR LENS PLACEMENT (IOC) (Left) - [place] 00:57 AP% 24.6 CDE 24.44. Irrigation/aspiration was used to remove the residual cortex and the capsular bag was  inflated with DiscoVisc. The intraocular lens was inserted into the capsular bag using a pre-loaded Acrysert Delivery System. Irrigation/aspiration was used to remove the residual DiscoVisc. The wound was inflated with balanced salt and checked for leaks. None were found. Miostat was injected via the paracentesis track and 0.1 ml of cefuroxime containing 1 mg of drug  was injected via the paracentesis track. The wound was checked for leaks again and none were found.   The bridal sutures were removed and two drops of Vigamox were placed on the eye. An eye shield was placed to protect the eye and the patient was discharged to the recovery area in good condition.   Deyanira Fesler MD

## 2015-03-07 NOTE — Transfer of Care (Signed)
Immediate Anesthesia Transfer of Care Note  Patient: Bethany Hicks  Procedure(s) Performed: Procedure(s) with comments: CATARACT EXTRACTION PHACO AND INTRAOCULAR LENS PLACEMENT (I[facility]) (Left) - [place] 00:57 AP% 24.6 CDE 24.44  Patient Location: PACU  Anesthesia Type:MAC  Level of Consciousness: awake, alert  and oriented  Airway & Oxygen Therapy: Patient Spontanous Breathing  Post-op Assessment: Report given to RN and Post -op Vital signs reviewed and stable  Post vital signs: Reviewed and stable  Last Vitals:  Filed Vitals:   03/07/15 0657  BP: 141/65  Pulse: 68  Temp: 36.7 C  Resp: 16    Complications: No apparent anesthesia complications

## 2015-03-07 NOTE — Interval H&P Note (Signed)
History and Physical Interval Note:  03/07/2015 7:22 AM  Bethany Hicks  has presented today for surgery, with the diagnosis of CATARACT  The various methods of treatment have been discussed with the patient and family. After consideration of risks, benefits and other options for treatment, the patient has consented to  Procedure(s): CATARACT EXTRACTION PHACO AND INTRAOCULAR LENS PLACEMENT ([place]) (Left) as a surgical intervention .  The patient's history has been reviewed, patient examined, no change in status, stable for surgery.  I have reviewed the patient's chart and labs.  Questions were answered to the patient's satisfaction.     [name]

## 2015-03-07 NOTE — Anesthesia Postprocedure Evaluation (Signed)
  Anesthesia Post-op Note  Patient: Bethany Hicks  Procedure(s) Performed: Procedure(s) with comments: CATARACT EXTRACTION PHACO AND INTRAOCULAR LENS PLACEMENT (I[facility]) (Left) - [place] 00:57 AP% 24.6 CDE 24.44  Anesthesia type:MAC  Patient location: PACU  Post pain: Pain level controlled  Post assessment: Post-op Vital signs reviewed, Patient's Cardiovascular Status Stable, Respiratory Function Stable, Patent Airway and No signs of Nausea or vomiting  Post vital signs: Reviewed and stable  Last Vitals:  Filed Vitals:   03/07/15 0905  BP: 124/61  Pulse:   Temp: 35.9 C  Resp: 16    Level of consciousness: awake, alert  and patient cooperative  Complications: No apparent anesthesia complications

## 2015-03-07 NOTE — Discharge Instructions (Signed)
Eye Surgery Discharge Instructions  Expect mild scratchy sensation or mild soreness. DO NOT RUB YOUR EYE!  The day of surgery:  Minimal physical activity, but bed rest is not required  No reading, computer work, or close hand work  No bending, lifting, or straining.  May watch TV  For 24 hours:  No driving, legal decisions, or alcoholic beverages  Safety precautions  Eat anything you prefer: It is better to start with liquids, then soup then solid foods.  _____ Eye patch should be worn until postoperative exam tomorrow.  ____ Solar shield eyeglasses should be worn for comfort in the sunlight/patch while sleeping  Resume all regular medications including aspirin or Coumadin if these were discontinued prior to surgery. You may shower, bathe, shave, or wash your hair. Tylenol may be taken for mild discomfort.  Call your doctor if you experience significant pain, nausea, or vomiting, fever > 101 or other signs of infection. [phone] or [phone] Specific instructions:  Follow-up Information    Follow up with Estill Cotta, MD.   Specialty:  Ophthalmology   Why:  10:30   Contact information:   [address]   [place] [address] [phone]

## 2015-03-07 NOTE — Anesthesia Preprocedure Evaluation (Signed)
Anesthesia Evaluation  Patient identified by MRN, date of birth, ID band Patient awake    Reviewed: Allergy & Precautions, H&P , NPO status , Patient's Chart, lab work & pertinent test results, reviewed documented beta blocker date and time   Airway Mallampati: II  TM Distance: >3 FB Neck ROM: full    Dental no notable dental hx.    Pulmonary neg pulmonary ROS,  breath sounds clear to auscultation  Pulmonary exam normal       Cardiovascular Exercise Tolerance: Good hypertension, negative cardio ROS  Rhythm:regular Rate:Normal     Neuro/Psych negative neurological ROS  negative psych ROS   GI/Hepatic negative GI ROS, Neg liver ROS, GERD-  ,  Endo/Other  negative endocrine ROSHypothyroidism   Renal/GU negative Renal ROS  negative genitourinary   Musculoskeletal   Abdominal   Peds  Hematology negative hematology ROS (+)   Anesthesia Other Findings   Reproductive/Obstetrics negative OB ROS                             Anesthesia Physical Anesthesia Plan  ASA: II  Anesthesia Plan: MAC   Post-op Pain Management:    Induction:   Airway Management Planned:   Additional Equipment:   Intra-op Plan:   Post-operative Plan:   Informed Consent: I have reviewed the patients History and Physical, chart, labs and discussed the procedure including the risks, benefits and alternatives for the proposed anesthesia with the patient or authorized representative who has indicated his/her understanding and acceptance.   Dental Advisory Given  Plan Discussed with: CRNA  Anesthesia Plan Comments:         Anesthesia Quick Evaluation

## 2015-03-07 NOTE — H&P (Signed)
  History and physical was faxed and scanned in.   

## 2015-03-08 ENCOUNTER — Encounter: Payer: Self-pay | Admitting: Ophthalmology

## 2015-09-27 ENCOUNTER — Encounter: Payer: Self-pay | Admitting: Gastroenterology

## 2015-11-01 DIAGNOSIS — L820 Inflamed seborrheic keratosis: Secondary | ICD-10-CM | POA: Diagnosis not present

## 2015-11-01 DIAGNOSIS — L821 Other seborrheic keratosis: Secondary | ICD-10-CM | POA: Diagnosis not present

## 2015-11-01 DIAGNOSIS — L300 Nummular dermatitis: Secondary | ICD-10-CM | POA: Diagnosis not present

## 2015-11-01 DIAGNOSIS — L570 Actinic keratosis: Secondary | ICD-10-CM | POA: Diagnosis not present

## 2015-11-01 DIAGNOSIS — Z85828 Personal history of other malignant neoplasm of skin: Secondary | ICD-10-CM | POA: Diagnosis not present

## 2015-11-16 DIAGNOSIS — Z961 Presence of intraocular lens: Secondary | ICD-10-CM | POA: Diagnosis not present

## 2015-12-22 DIAGNOSIS — K5792 Diverticulitis of intestine, part unspecified, without perforation or abscess without bleeding: Secondary | ICD-10-CM | POA: Diagnosis not present

## 2016-01-03 DIAGNOSIS — E038 Other specified hypothyroidism: Secondary | ICD-10-CM | POA: Diagnosis not present

## 2016-01-03 DIAGNOSIS — D510 Vitamin B12 deficiency anemia due to intrinsic factor deficiency: Secondary | ICD-10-CM | POA: Diagnosis not present

## 2016-01-03 DIAGNOSIS — K5792 Diverticulitis of intestine, part unspecified, without perforation or abscess without bleeding: Secondary | ICD-10-CM | POA: Diagnosis not present

## 2016-01-16 DIAGNOSIS — E038 Other specified hypothyroidism: Secondary | ICD-10-CM | POA: Diagnosis not present

## 2016-01-16 DIAGNOSIS — K5792 Diverticulitis of intestine, part unspecified, without perforation or abscess without bleeding: Secondary | ICD-10-CM | POA: Diagnosis not present

## 2016-01-16 DIAGNOSIS — D510 Vitamin B12 deficiency anemia due to intrinsic factor deficiency: Secondary | ICD-10-CM | POA: Diagnosis not present

## 2016-05-01 DIAGNOSIS — L821 Other seborrheic keratosis: Secondary | ICD-10-CM | POA: Diagnosis not present

## 2016-05-01 DIAGNOSIS — D1800 Hemangioma unspecified site: Secondary | ICD-10-CM | POA: Diagnosis not present

## 2016-05-01 DIAGNOSIS — L300 Nummular dermatitis: Secondary | ICD-10-CM | POA: Diagnosis not present

## 2016-05-01 DIAGNOSIS — L820 Inflamed seborrheic keratosis: Secondary | ICD-10-CM | POA: Diagnosis not present

## 2016-05-01 DIAGNOSIS — L570 Actinic keratosis: Secondary | ICD-10-CM | POA: Diagnosis not present

## 2016-05-01 DIAGNOSIS — Z85828 Personal history of other malignant neoplasm of skin: Secondary | ICD-10-CM | POA: Diagnosis not present

## 2016-07-11 DIAGNOSIS — E038 Other specified hypothyroidism: Secondary | ICD-10-CM | POA: Diagnosis not present

## 2016-07-11 DIAGNOSIS — D510 Vitamin B12 deficiency anemia due to intrinsic factor deficiency: Secondary | ICD-10-CM | POA: Diagnosis not present

## 2016-07-12 DIAGNOSIS — E038 Other specified hypothyroidism: Secondary | ICD-10-CM | POA: Diagnosis not present

## 2016-07-12 DIAGNOSIS — D510 Vitamin B12 deficiency anemia due to intrinsic factor deficiency: Secondary | ICD-10-CM | POA: Diagnosis not present

## 2016-07-17 DIAGNOSIS — H01001 Unspecified blepharitis right upper eyelid: Secondary | ICD-10-CM | POA: Diagnosis not present

## 2016-07-18 DIAGNOSIS — Z23 Encounter for immunization: Secondary | ICD-10-CM | POA: Diagnosis not present

## 2016-07-18 DIAGNOSIS — Z0000 Encounter for general adult medical examination without abnormal findings: Secondary | ICD-10-CM | POA: Diagnosis not present

## 2016-07-31 DIAGNOSIS — H16223 Keratoconjunctivitis sicca, not specified as Sjogren's, bilateral: Secondary | ICD-10-CM | POA: Diagnosis not present

## 2016-08-08 DIAGNOSIS — Z1231 Encounter for screening mammogram for malignant neoplasm of breast: Secondary | ICD-10-CM | POA: Diagnosis not present

## 2016-08-14 DIAGNOSIS — H16223 Keratoconjunctivitis sicca, not specified as Sjogren's, bilateral: Secondary | ICD-10-CM | POA: Diagnosis not present

## 2016-09-26 DIAGNOSIS — H10503 Unspecified blepharoconjunctivitis, bilateral: Secondary | ICD-10-CM | POA: Diagnosis not present

## 2016-10-03 DIAGNOSIS — H10503 Unspecified blepharoconjunctivitis, bilateral: Secondary | ICD-10-CM | POA: Diagnosis not present

## 2016-11-06 DIAGNOSIS — Z85828 Personal history of other malignant neoplasm of skin: Secondary | ICD-10-CM | POA: Diagnosis not present

## 2016-11-06 DIAGNOSIS — L308 Other specified dermatitis: Secondary | ICD-10-CM | POA: Diagnosis not present

## 2016-11-06 DIAGNOSIS — L821 Other seborrheic keratosis: Secondary | ICD-10-CM | POA: Diagnosis not present

## 2016-11-06 DIAGNOSIS — L570 Actinic keratosis: Secondary | ICD-10-CM | POA: Diagnosis not present

## 2017-01-08 DIAGNOSIS — Z961 Presence of intraocular lens: Secondary | ICD-10-CM | POA: Diagnosis not present

## 2017-01-16 DIAGNOSIS — D510 Vitamin B12 deficiency anemia due to intrinsic factor deficiency: Secondary | ICD-10-CM | POA: Diagnosis not present

## 2017-01-16 DIAGNOSIS — Z0000 Encounter for general adult medical examination without abnormal findings: Secondary | ICD-10-CM | POA: Diagnosis not present

## 2017-01-16 DIAGNOSIS — I10 Essential (primary) hypertension: Secondary | ICD-10-CM | POA: Diagnosis not present

## 2017-01-16 DIAGNOSIS — E039 Hypothyroidism, unspecified: Secondary | ICD-10-CM | POA: Diagnosis not present

## 2017-07-16 DIAGNOSIS — D510 Vitamin B12 deficiency anemia due to intrinsic factor deficiency: Secondary | ICD-10-CM | POA: Diagnosis not present

## 2017-07-16 DIAGNOSIS — E039 Hypothyroidism, unspecified: Secondary | ICD-10-CM | POA: Diagnosis not present

## 2017-07-23 DIAGNOSIS — Z0000 Encounter for general adult medical examination without abnormal findings: Secondary | ICD-10-CM | POA: Diagnosis not present

## 2017-07-23 DIAGNOSIS — C4441 Basal cell carcinoma of skin of scalp and neck: Secondary | ICD-10-CM | POA: Diagnosis not present

## 2017-07-23 DIAGNOSIS — Z23 Encounter for immunization: Secondary | ICD-10-CM | POA: Diagnosis not present

## 2017-07-23 DIAGNOSIS — D510 Vitamin B12 deficiency anemia due to intrinsic factor deficiency: Secondary | ICD-10-CM | POA: Diagnosis not present

## 2017-07-23 DIAGNOSIS — E039 Hypothyroidism, unspecified: Secondary | ICD-10-CM | POA: Diagnosis not present

## 2017-10-29 DIAGNOSIS — J40 Bronchitis, not specified as acute or chronic: Secondary | ICD-10-CM | POA: Diagnosis not present

## 2017-11-14 DIAGNOSIS — M109 Gout, unspecified: Secondary | ICD-10-CM | POA: Diagnosis not present

## 2017-12-23 DIAGNOSIS — M109 Gout, unspecified: Secondary | ICD-10-CM | POA: Diagnosis not present

## 2017-12-31 DIAGNOSIS — R079 Chest pain, unspecified: Secondary | ICD-10-CM | POA: Diagnosis not present

## 2017-12-31 DIAGNOSIS — M5010 Cervical disc disorder with radiculopathy, unspecified cervical region: Secondary | ICD-10-CM | POA: Diagnosis not present

## 2018-01-14 DIAGNOSIS — D510 Vitamin B12 deficiency anemia due to intrinsic factor deficiency: Secondary | ICD-10-CM | POA: Diagnosis not present

## 2018-01-14 DIAGNOSIS — E039 Hypothyroidism, unspecified: Secondary | ICD-10-CM | POA: Diagnosis not present

## 2018-01-21 DIAGNOSIS — Z79899 Other long term (current) drug therapy: Secondary | ICD-10-CM | POA: Diagnosis not present

## 2018-01-21 DIAGNOSIS — E782 Mixed hyperlipidemia: Secondary | ICD-10-CM | POA: Diagnosis not present

## 2018-01-21 DIAGNOSIS — E039 Hypothyroidism, unspecified: Secondary | ICD-10-CM | POA: Diagnosis not present

## 2018-01-21 DIAGNOSIS — D510 Vitamin B12 deficiency anemia due to intrinsic factor deficiency: Secondary | ICD-10-CM | POA: Diagnosis not present

## 2018-01-21 DIAGNOSIS — Z0000 Encounter for general adult medical examination without abnormal findings: Secondary | ICD-10-CM | POA: Diagnosis not present

## 2018-03-14 DIAGNOSIS — L820 Inflamed seborrheic keratosis: Secondary | ICD-10-CM | POA: Diagnosis not present

## 2018-03-14 DIAGNOSIS — L821 Other seborrheic keratosis: Secondary | ICD-10-CM | POA: Diagnosis not present

## 2018-03-14 DIAGNOSIS — L304 Erythema intertrigo: Secondary | ICD-10-CM | POA: Diagnosis not present

## 2018-03-25 DIAGNOSIS — M1A00X0 Idiopathic chronic gout, unspecified site, without tophus (tophi): Secondary | ICD-10-CM | POA: Diagnosis not present

## 2018-03-25 DIAGNOSIS — K921 Melena: Secondary | ICD-10-CM | POA: Diagnosis not present

## 2018-04-03 DIAGNOSIS — K921 Melena: Secondary | ICD-10-CM | POA: Diagnosis not present

## 2018-05-13 DIAGNOSIS — L304 Erythema intertrigo: Secondary | ICD-10-CM | POA: Diagnosis not present

## 2018-05-13 DIAGNOSIS — L820 Inflamed seborrheic keratosis: Secondary | ICD-10-CM | POA: Diagnosis not present

## 2018-05-13 DIAGNOSIS — Z85828 Personal history of other malignant neoplasm of skin: Secondary | ICD-10-CM | POA: Diagnosis not present

## 2018-05-13 DIAGNOSIS — L821 Other seborrheic keratosis: Secondary | ICD-10-CM | POA: Diagnosis not present

## 2018-05-26 DIAGNOSIS — K921 Melena: Secondary | ICD-10-CM | POA: Diagnosis not present

## 2018-07-02 DIAGNOSIS — Z1231 Encounter for screening mammogram for malignant neoplasm of breast: Secondary | ICD-10-CM | POA: Diagnosis not present

## 2018-07-29 DIAGNOSIS — E782 Mixed hyperlipidemia: Secondary | ICD-10-CM | POA: Diagnosis not present

## 2018-07-29 DIAGNOSIS — D510 Vitamin B12 deficiency anemia due to intrinsic factor deficiency: Secondary | ICD-10-CM | POA: Diagnosis not present

## 2018-07-29 DIAGNOSIS — Z79899 Other long term (current) drug therapy: Secondary | ICD-10-CM | POA: Diagnosis not present

## 2018-07-29 DIAGNOSIS — E039 Hypothyroidism, unspecified: Secondary | ICD-10-CM | POA: Diagnosis not present

## 2018-08-05 DIAGNOSIS — D510 Vitamin B12 deficiency anemia due to intrinsic factor deficiency: Secondary | ICD-10-CM | POA: Diagnosis not present

## 2018-08-05 DIAGNOSIS — Z0000 Encounter for general adult medical examination without abnormal findings: Secondary | ICD-10-CM | POA: Diagnosis not present

## 2018-08-05 DIAGNOSIS — Z23 Encounter for immunization: Secondary | ICD-10-CM | POA: Diagnosis not present

## 2018-08-05 DIAGNOSIS — E039 Hypothyroidism, unspecified: Secondary | ICD-10-CM | POA: Diagnosis not present

## 2018-08-19 DIAGNOSIS — R451 Restlessness and agitation: Secondary | ICD-10-CM | POA: Diagnosis not present

## 2018-09-17 ENCOUNTER — Other Ambulatory Visit: Payer: Self-pay

## 2018-09-17 ENCOUNTER — Emergency Department
Admission: EM | Admit: 2018-09-17 | Discharge: 2018-09-17 | Disposition: A | Discharge status: Home / Self Care | Payer: PPO | Attending: Emergency Medicine | Admitting: Emergency Medicine

## 2018-09-17 DIAGNOSIS — I10 Essential (primary) hypertension: Secondary | ICD-10-CM | POA: Insufficient documentation

## 2018-09-17 DIAGNOSIS — R4182 Altered mental status, unspecified: Secondary | ICD-10-CM | POA: Diagnosis not present

## 2018-09-17 DIAGNOSIS — E039 Hypothyroidism, unspecified: Secondary | ICD-10-CM | POA: Diagnosis not present

## 2018-09-17 DIAGNOSIS — N390 Urinary tract infection, site not specified: Secondary | ICD-10-CM | POA: Insufficient documentation

## 2018-09-17 DIAGNOSIS — E871 Hypo-osmolality and hyponatremia: Secondary | ICD-10-CM | POA: Insufficient documentation

## 2018-09-17 DIAGNOSIS — E876 Hypokalemia: Secondary | ICD-10-CM | POA: Diagnosis not present

## 2018-09-17 DIAGNOSIS — Z87891 Personal history of nicotine dependence: Secondary | ICD-10-CM | POA: Diagnosis not present

## 2018-09-17 DIAGNOSIS — F419 Anxiety disorder, unspecified: Secondary | ICD-10-CM | POA: Insufficient documentation

## 2018-09-17 DIAGNOSIS — Z79899 Other long term (current) drug therapy: Secondary | ICD-10-CM | POA: Insufficient documentation

## 2018-09-17 LAB — COMPREHENSIVE METABOLIC PANEL
ALBUMIN: 4.3 g/dL (ref 3.5–5.0)
ALK PHOS: 75 U/L (ref 38–126)
ALT: 22 U/L (ref 0–44)
ANION GAP: 11 (ref 5–15)
AST: 38 U/L (ref 15–41)
BILIRUBIN TOTAL: 0.8 mg/dL (ref 0.3–1.2)
BUN: 14 mg/dL (ref 8–23)
CALCIUM: 9 mg/dL (ref 8.9–10.3)
CO2: 26 mmol/L (ref 22–32)
Chloride: 90 mmol/L — ABNORMAL LOW (ref 98–111)
Creatinine, Ser: 1.04 mg/dL — ABNORMAL HIGH (ref 0.44–1.00)
GFR calc Af Amer: 56 mL/min — ABNORMAL LOW (ref >60)
GFR calc non Af Amer: 48 mL/min — ABNORMAL LOW (ref >60)
Glucose, Bld: 109 mg/dL — ABNORMAL HIGH (ref 70–99)
Potassium: 2.9 mmol/L — ABNORMAL LOW (ref 3.5–5.1)
SODIUM: 127 mmol/L — AB (ref 135–145)
TOTAL PROTEIN: 7.2 g/dL (ref 6.5–8.1)

## 2018-09-17 LAB — CBC WITH DIFFERENTIAL/PLATELET
ABS IMMATURE GRANULOCYTES: 0.04 10*3/uL (ref 0.00–0.07)
BASOS ABS: 0 10*3/uL (ref 0.0–0.1)
BASOS PCT: 0 %
EOS ABS: 0 10*3/uL (ref 0.0–0.5)
Eosinophils Relative: 0 %
HCT: 37.7 % (ref 36.0–46.0)
Hemoglobin: 12.8 g/dL (ref 12.0–15.0)
IMMATURE GRANULOCYTES: 1 %
LYMPHS ABS: 1.3 10*3/uL (ref 0.7–4.0)
Lymphocytes Relative: 18 %
MCH: 28.8 pg (ref 26.0–34.0)
MCHC: 34 g/dL (ref 30.0–36.0)
MCV: 84.9 fL (ref 80.0–100.0)
Monocytes Absolute: 0.8 10*3/uL (ref 0.1–1.0)
Monocytes Relative: 12 %
NEUTROS ABS: 5 10*3/uL (ref 1.7–7.7)
NEUTROS PCT: 69 %
NRBC: 0 % (ref 0.0–0.2)
PLATELETS: 204 10*3/uL (ref 150–400)
RBC: 4.44 MIL/uL (ref 3.87–5.11)
RDW: 13.2 % (ref 11.5–15.5)
WBC: 7.2 10*3/uL (ref 4.0–10.5)

## 2018-09-17 LAB — URINALYSIS, COMPLETE (UACMP) WITH MICROSCOPIC
Bilirubin Urine: NEGATIVE
GLUCOSE, UA: NEGATIVE mg/dL
Ketones, ur: NEGATIVE mg/dL
NITRITE: NEGATIVE
PH: 5 (ref 5.0–8.0)
Protein, ur: 30 mg/dL — AB
SPECIFIC GRAVITY, URINE: 1.015 (ref 1.005–1.030)
WBC, UA: >50 WBC/hpf — ABNORMAL HIGH (ref 0–5)

## 2018-09-17 LAB — BASIC METABOLIC PANEL
Anion gap: 9 (ref 5–15)
BUN: 13 mg/dL (ref 8–23)
CO2: 26 mmol/L (ref 22–32)
Calcium: 8.3 mg/dL — ABNORMAL LOW (ref 8.9–10.3)
Chloride: 96 mmol/L — ABNORMAL LOW (ref 98–111)
Creatinine, Ser: 0.94 mg/dL (ref 0.44–1.00)
GFR calc Af Amer: >60 mL/min (ref >60)
GFR calc non Af Amer: 54 mL/min — ABNORMAL LOW (ref >60)
Glucose, Bld: 105 mg/dL — ABNORMAL HIGH (ref 70–99)
Potassium: 2.6 mmol/L — CL (ref 3.5–5.1)
Sodium: 131 mmol/L — ABNORMAL LOW (ref 135–145)

## 2018-09-17 LAB — TSH: TSH: 3.489 u[IU]/mL (ref 0.350–4.500)

## 2018-09-17 MED ORDER — POTASSIUM CHLORIDE CRYS ER 10 MEQ PO TBCR: 10.0000 meq | EXTENDED_RELEASE_TABLET | Freq: Two times a day (BID) | ORAL | 0 refills | Status: DC

## 2018-09-17 MED ORDER — SODIUM CHLORIDE 1 G PO TABS: 1.0000 g | ORAL_TABLET | Freq: Three times a day (TID) | ORAL | 0 refills | Status: DC

## 2018-09-17 MED ORDER — CEPHALEXIN 250 MG PO CAPS: 250.0000 mg | ORAL_CAPSULE | Freq: Three times a day (TID) | ORAL | 0 refills | Status: AC

## 2018-09-17 MED ORDER — SODIUM CHLORIDE 0.9 % IV BOLUS
1000.0000 mL | Freq: Once | INTRAVENOUS | Status: AC
  Administered 2018-09-17: 1000 mL via INTRAVENOUS

## 2018-09-17 MED ORDER — SODIUM CHLORIDE 0.9 % IV SOLN
1.0000 g | Freq: Once | INTRAVENOUS | Status: AC
  Administered 2018-09-17: 1 g via INTRAVENOUS
  Filled 2018-09-17: qty 10

## 2018-09-17 MED ORDER — SODIUM CHLORIDE 0.9 % IV BOLUS
500.0000 mL | Freq: Once | INTRAVENOUS | Status: AC
  Administered 2018-09-17: 500 mL via INTRAVENOUS

## 2018-09-17 NOTE — Discharge Instructions (Addendum)
Return to the emergency room for any new or worrisome symptoms.  We are sending a urine culture, to see if our antibiotics work.  However, we will give a dose of IV antibiotics as well here.  You may consider taking 1-1/2 to Zoloft pills instead of 2.  If you have any significant concerns return to the emergency room.  We will give you a prescription for antibiotics to take, we also going to send you with salt pills to take for next couple days please do not feel to call your doctor on Friday.  If there is significant confusion or other concerns including fever vomiting diarrhea or other issues please return to the ER

## 2018-09-17 NOTE — ED Triage Notes (Signed)
Per daughters-pt has been very agitated for several weeks. Pt sertraline dose was doubled today and agitation has increased. She was confused about directions to somewhere she has been many times today. Pt is AOx4. No fevers.

## 2018-09-17 NOTE — ED Provider Notes (Addendum)
[facility] Emergency Department Provider Note  ____________________________________________   I have reviewed the triage vital signs and the nursing notes. Where available I have reviewed prior notes and, if possible and indicated, outside hospital notes.    HISTORY  Chief Complaint Altered Mental Status    HPI Bethany Hicks is a 84 y.o. female who presents today complaining of being somewhat confused and agitated.  Patient has a history of increased anxiety.  The holidays are coming, and she is very worried about all the family members coming in although she is relieved she does not have to cook.  Family states that this irritation and agitation and inability to settle has been gone for some time they have been try to change her medications, they got rid of her Zyrtec, that did not seem to help, she is on Zoloft, they today took an extra pill, and patient became a little bit confused.  She is back to her baseline at this time, she still remains somewhat anxious.  Patient states she is just anxious about the holidays.  Family do not wish her admitted, that she has had no falls they do not feel that there is any CVA, this is been going on for months however they want to make sure she does not have a urinary tract infection or evidence of dehydration which could be contributing to things.   Past Medical History:  Diagnosis Date  . Anxiety   . Fluid collection (edema) in the arms, legs, hands and feet   . History of panic attacks   . Hypertension   . Hypothyroidism     Patient Active Problem List   Diagnosis Date Noted  . HYPERLIPIDEMIA 07/21/2007  . ANXIETY 07/21/2007  . TOBACCO ABUSE 07/21/2007  . HYPERTENSION 07/21/2007  . ALLERGIC RHINITIS 07/21/2007  . GERD 07/21/2007  . DIVERTICULOSIS, COLON 07/21/2007  . OSTEOPENIA 07/21/2007  . ATRIAL ARRHYTHMIAS 07/04/2007  . SKIN LESION 07/04/2007  . Unspecified hypothyroidism 03/20/2007  . HYPERCHOLESTEROLEMIA,  PURE 03/20/2007    Past Surgical History:  Procedure Laterality Date  . ABDOMINAL HYSTERECTOMY    . CATARACT EXTRACTION W/PHACO Left 03/07/2015   Procedure: CATARACT EXTRACTION PHACO AND INTRAOCULAR LENS PLACEMENT (IOC);  Surgeon: Estill Cotta, MD;  Location: [facility] ORS;  Service: Ophthalmology;  Laterality: Left;  [place] 00:57 AP% 24.6 CDE 24.44  . CHOLECYSTECTOMY    . EYE SURGERY     cataract    Prior to Admission medications   Medication Sig Start Date End Date Taking? Authorizing Provider  amLODipine (NORVASC) 5 MG tablet Take 2.5 mg by mouth 2 (two) times daily.    [provider]  cetirizine (ZYRTEC) 10 MG tablet Take 10 mg by mouth daily.    [provider]  cholecalciferol (VITAMIN D) 400 UNITS TABS tablet Take 1,000 Units by mouth daily.    [provider]  hydrochlorothiazide (HYDRODIURIL) 25 MG tablet Take 12.5 mg by mouth 2 (two) times daily.    [provider]  levothyroxine (SYNTHROID, LEVOTHROID) 100 MCG tablet Take 100 mcg by mouth daily before breakfast.    [provider]  Multiple Vitamin (MULTIVITAMIN) capsule Take 2 capsules by mouth daily.    [provider]  potassium chloride (K-DUR,KLOR-CON) 10 MEQ tablet Take 10 mEq by mouth once.    [provider]  sertraline (ZOLOFT) 50 MG tablet Take 50 mg by mouth at bedtime.    [provider]    Allergies Amlodipine besylate; Calcitonin (salmon); Doxazosin mesylate;  Hydrocod polst-cpm polst er; Raloxifene; Risedronate sodium; Codeine; and Singulair [montelukast]  History reviewed. No pertinent family history.  Social History Social History   Tobacco Use  . Smoking status: Former Smoker  Substance Use Topics  . Alcohol use: No  . Drug use: Not on file    Review of Systems Constitutional: No fever/chills Eyes: No visual changes. ENT: No sore throat. No stiff neck no neck pain Cardiovascular: Denies chest pain. Respiratory: Denies  shortness of breath. Gastrointestinal:   no vomiting.  No diarrhea.  No constipation. Genitourinary: Negative for dysuria. Musculoskeletal: Negative lower extremity swelling Skin: Negative for rash. Neurological: Negative for severe headaches, focal weakness or numbness.   ____________________________________________   PHYSICAL EXAM:  VITAL SIGNS: ED Triage Vitals  Enc Vitals Group     BP 09/17/18 1804 (!) 173/79     Pulse Rate 09/17/18 1804 70     Resp 09/17/18 1804 18     Temp 09/17/18 1804 97.8 F (36.6 C)     Temp Source 09/17/18 1804 Oral     SpO2 09/17/18 1804 97 %     Weight 09/17/18 1800 168 lb (76.2 kg)     Height 09/17/18 1800 5\' 5"  (1.651 m)     Head Circumference --      Peak Flow --      Pain Score 09/17/18 1800 0     Pain Loc --      Pain Edu? --      Excl. in [place]? --     Constitutional: Alert and oriented to name and place, mildly anxious elderly woman in no acute distress. Well appearing and in no acute distress. Eyes: Conjunctivae are normal Head: Atraumatic HEENT: No congestion/rhinnorhea. Mucous membranes are moist.  Oropharynx non-erythematous Neck:   Nontender with no meningismus, no masses, no stridor Cardiovascular: Normal rate, regular rhythm. Grossly normal heart sounds.  Good peripheral circulation. Respiratory: Normal respiratory effort.  No retractions. Lungs CTAB. Abdominal: Soft and nontender. No distention. No guarding no rebound Back:  There is no focal tenderness or step off.  there is no midline tenderness there are no lesions noted. there is no CVA tenderness Musculoskeletal: No lower extremity tenderness, no upper extremity tenderness. No joint effusions, no DVT signs strong distal pulses no edema Neurologic:  Normal speech and language. No gross focal neurologic deficits are appreciated.  Skin:  Skin is warm, dry and intact. No rash noted. Psychiatric: Mood and affect are mildly anxious. Speech and behavior are  normal.  ____________________________________________   LABS (all labs ordered are listed, but only abnormal results are displayed)  Labs Reviewed  URINALYSIS, COMPLETE (UACMP) WITH MICROSCOPIC - Abnormal; Notable for the following components:      Result Value   Color, Urine YELLOW (*)    APPearance CLOUDY (*)    Hgb urine dipstick SMALL (*)    Protein, ur 30 (*)    Leukocytes, UA MODERATE (*)    WBC, UA >50 (*)    Bacteria, UA MANY (*)    Non Squamous Epithelial PRESENT (*)    All other components within normal limits  COMPREHENSIVE METABOLIC PANEL  CBC WITH DIFFERENTIAL/PLATELET  TSH    Pertinent labs  results that were available during my care of the patient were reviewed by me and considered in my medical decision making (see chart for details). ____________________________________________  EKG  I personally interpreted any EKGs ordered by me or triage  ____________________________________________  RADIOLOGY  Pertinent labs & imaging results that were available  during my care of the patient were reviewed by me and considered in my medical decision making (see chart for details). If possible, patient and/or family made aware of any abnormal findings.  No results found. ____________________________________________    PROCEDURES  Procedure(s) performed: None  Procedures  Critical Care performed: None  ____________________________________________   INITIAL IMPRESSION / ASSESSMENT AND PLAN / ED COURSE  Pertinent labs & imaging results that were available during my care of the patient were reviewed by me and considered in my medical decision making (see chart for details).  Patient here with a history of anxiety he was anxious today tried extra Zoloft and became a little bit confused and now is back to her baseline.  No evidence of CVA, no evidence of acute metabolic syndrome.  Patient states this is anxious for the holidays.  Tomorrow is Thanksgiving.  Family  wants me to check out and make sure that she is "doing okay".  Specifically they want me to check her urine and blood work.  They feel that this is most likely her baseline dementia related anxiety but they want to ensure nothing else is going on.  I am happy to evaluate this for them.  I do not think an acute CT scan of the head is warranted.  We will do a TSH on her, perhaps the Zoloft amount was too high.  Instead of taking 2 I recommended that if they are worried about her agitation and try 1.5 pills which they think might be better for them.  ----------------------------------------- 8:56 PM on 09/17/2018 -----------------------------------------  Signed out to dr. Kerman Passey at the end of my shift.  He does have a urinary tract infection and very slightly decreased sodium of her baseline, we will repeat the sodium with IV fluid and send her home with some salt pills.  It is Thanksgiving tomorrow family and patient are adamant they do not wish to be admitted and I do not think that a sodium of 127 should mandate admission.  I have ordered a repeat BMP after the fluids, that will be called by Dr. Kerman Passey.  Patient will likely be able to go home with antibiotics.  No old culture exist, therefore extensive culture awareness discussion had with family.  They are very comfortable with this patient is in no acute distress and eager to go home.  He remains at her baseline.    ____________________________________________   FINAL CLINICAL IMPRESSION(S) / ED DIAGNOSES  Final diagnoses:  None      This chart was dictated using voice recognition software.  Despite best efforts to proofread,  errors can occur which can change meaning.      Schuyler Amor, MD 09/17/18 2000    Schuyler Amor, MD 09/17/18 [phone]

## 2018-09-17 NOTE — ED Provider Notes (Signed)
-----------------------------------------   11:13 PM on 09/17/2018 -----------------------------------------  Patient sodium is increased to 131 though the potassium is slightly lower.  We will discharge with potassium supplementation.  Patient is taking antibiotics for urinary tract infection.  Family agreeable to plan of care and still wished to be discharged from the emergency department.   Harvest Dark, MD 09/17/18 [phone]

## 2018-09-17 NOTE — ED Notes (Signed)
Pt found pacing in hallway with one of her daughters by her side. Upon introduction, she stated that she can not sit still. Her daughter explained that approx 1 month ago her PCM placed her on singulair to treat asthma like Sx and her mom became agitated and restless immediately after starting on the new med. Pt has been on sertraline for depression/anxiety and PCM instructed pt to double her dose in response to the rxn. Pts daughters state that her restlessness increased after increasing the sertraline. Pt reports that she feels that she cant stop moving and sleep is difficult.

## 2018-09-20 LAB — URINE CULTURE: Culture: >=100000 — AB

## 2018-09-23 DIAGNOSIS — N3090 Cystitis, unspecified without hematuria: Secondary | ICD-10-CM | POA: Diagnosis not present

## 2018-09-23 DIAGNOSIS — E876 Hypokalemia: Secondary | ICD-10-CM | POA: Diagnosis not present

## 2018-09-23 DIAGNOSIS — E871 Hypo-osmolality and hyponatremia: Secondary | ICD-10-CM | POA: Diagnosis not present

## 2018-10-01 DIAGNOSIS — E871 Hypo-osmolality and hyponatremia: Secondary | ICD-10-CM | POA: Diagnosis not present

## 2018-10-01 DIAGNOSIS — E039 Hypothyroidism, unspecified: Secondary | ICD-10-CM | POA: Diagnosis not present

## 2018-10-06 DIAGNOSIS — N390 Urinary tract infection, site not specified: Secondary | ICD-10-CM | POA: Diagnosis not present

## 2018-10-06 DIAGNOSIS — E871 Hypo-osmolality and hyponatremia: Secondary | ICD-10-CM | POA: Diagnosis not present

## 2018-10-07 DIAGNOSIS — N390 Urinary tract infection, site not specified: Secondary | ICD-10-CM | POA: Diagnosis not present

## 2018-10-07 DIAGNOSIS — E871 Hypo-osmolality and hyponatremia: Secondary | ICD-10-CM | POA: Diagnosis not present

## 2018-10-29 DIAGNOSIS — R399 Unspecified symptoms and signs involving the genitourinary system: Secondary | ICD-10-CM | POA: Diagnosis not present

## 2018-10-30 DIAGNOSIS — R5383 Other fatigue: Secondary | ICD-10-CM | POA: Diagnosis not present

## 2018-10-30 DIAGNOSIS — D510 Vitamin B12 deficiency anemia due to intrinsic factor deficiency: Secondary | ICD-10-CM | POA: Diagnosis not present

## 2018-10-30 DIAGNOSIS — E039 Hypothyroidism, unspecified: Secondary | ICD-10-CM | POA: Diagnosis not present

## 2018-10-30 DIAGNOSIS — N390 Urinary tract infection, site not specified: Secondary | ICD-10-CM | POA: Diagnosis not present

## 2018-11-13 DIAGNOSIS — H903 Sensorineural hearing loss, bilateral: Secondary | ICD-10-CM | POA: Diagnosis not present

## 2018-11-18 DIAGNOSIS — H903 Sensorineural hearing loss, bilateral: Secondary | ICD-10-CM | POA: Diagnosis not present

## 2018-11-20 DIAGNOSIS — E871 Hypo-osmolality and hyponatremia: Secondary | ICD-10-CM | POA: Diagnosis not present

## 2018-11-20 DIAGNOSIS — R5383 Other fatigue: Secondary | ICD-10-CM | POA: Diagnosis not present

## 2018-11-20 DIAGNOSIS — D510 Vitamin B12 deficiency anemia due to intrinsic factor deficiency: Secondary | ICD-10-CM | POA: Diagnosis not present

## 2018-12-18 DIAGNOSIS — S0512XA Contusion of eyeball and orbital tissues, left eye, initial encounter: Secondary | ICD-10-CM | POA: Diagnosis not present

## 2019-01-28 DIAGNOSIS — D510 Vitamin B12 deficiency anemia due to intrinsic factor deficiency: Secondary | ICD-10-CM | POA: Diagnosis not present

## 2019-01-28 DIAGNOSIS — E039 Hypothyroidism, unspecified: Secondary | ICD-10-CM | POA: Diagnosis not present

## 2019-02-04 DIAGNOSIS — Z0000 Encounter for general adult medical examination without abnormal findings: Secondary | ICD-10-CM | POA: Diagnosis not present

## 2019-02-04 DIAGNOSIS — E538 Deficiency of other specified B group vitamins: Secondary | ICD-10-CM | POA: Diagnosis not present

## 2019-02-04 DIAGNOSIS — E039 Hypothyroidism, unspecified: Secondary | ICD-10-CM | POA: Diagnosis not present

## 2019-02-04 DIAGNOSIS — E871 Hypo-osmolality and hyponatremia: Secondary | ICD-10-CM | POA: Diagnosis not present

## 2019-02-17 DIAGNOSIS — R300 Dysuria: Secondary | ICD-10-CM | POA: Diagnosis not present

## 2019-02-27 ENCOUNTER — Other Ambulatory Visit: Payer: Self-pay | Admitting: Internal Medicine

## 2019-02-27 DIAGNOSIS — E039 Hypothyroidism, unspecified: Secondary | ICD-10-CM | POA: Diagnosis not present

## 2019-02-27 DIAGNOSIS — E538 Deficiency of other specified B group vitamins: Secondary | ICD-10-CM | POA: Diagnosis not present

## 2019-02-27 DIAGNOSIS — I639 Cerebral infarction, unspecified: Secondary | ICD-10-CM | POA: Diagnosis not present

## 2019-02-27 DIAGNOSIS — G9340 Encephalopathy, unspecified: Secondary | ICD-10-CM | POA: Diagnosis not present

## 2019-03-02 ENCOUNTER — Ambulatory Visit
Admission: RE | Admit: 2019-03-02 | Discharge: 2019-03-02 | Disposition: A | Discharge status: Home / Self Care | Payer: PPO | Source: Ambulatory Visit | Attending: Internal Medicine | Admitting: Internal Medicine

## 2019-03-02 ENCOUNTER — Other Ambulatory Visit: Payer: Self-pay

## 2019-03-02 DIAGNOSIS — I639 Cerebral infarction, unspecified: Secondary | ICD-10-CM

## 2019-03-02 DIAGNOSIS — S0990XA Unspecified injury of head, initial encounter: Secondary | ICD-10-CM | POA: Diagnosis not present

## 2019-03-02 LAB — POCT I-STAT CREATININE: Creatinine, Ser: 0.9 mg/dL (ref 0.44–1.00)

## 2019-03-02 MED ORDER — GADOBUTROL 1 MMOL/ML IV SOLN
7.0000 mL | Freq: Once | INTRAVENOUS | Status: AC | PRN
  Administered 2019-03-02: 7 mL via INTRAVENOUS

## 2019-03-11 DIAGNOSIS — H26493 Other secondary cataract, bilateral: Secondary | ICD-10-CM | POA: Diagnosis not present

## 2019-03-20 DIAGNOSIS — R413 Other amnesia: Secondary | ICD-10-CM | POA: Diagnosis not present

## 2019-03-20 DIAGNOSIS — E538 Deficiency of other specified B group vitamins: Secondary | ICD-10-CM | POA: Diagnosis not present

## 2019-03-31 DIAGNOSIS — R410 Disorientation, unspecified: Secondary | ICD-10-CM | POA: Diagnosis not present

## 2019-03-31 DIAGNOSIS — N3090 Cystitis, unspecified without hematuria: Secondary | ICD-10-CM | POA: Diagnosis not present

## 2019-04-07 DIAGNOSIS — R2689 Other abnormalities of gait and mobility: Secondary | ICD-10-CM | POA: Diagnosis not present

## 2019-04-07 DIAGNOSIS — E538 Deficiency of other specified B group vitamins: Secondary | ICD-10-CM | POA: Diagnosis not present

## 2019-04-07 DIAGNOSIS — G939 Disorder of brain, unspecified: Secondary | ICD-10-CM | POA: Diagnosis not present

## 2019-04-07 DIAGNOSIS — G309 Alzheimer's disease, unspecified: Secondary | ICD-10-CM | POA: Diagnosis not present

## 2019-04-07 DIAGNOSIS — F0150 Vascular dementia without behavioral disturbance: Secondary | ICD-10-CM | POA: Diagnosis not present

## 2019-04-07 DIAGNOSIS — F0280 Dementia in other diseases classified elsewhere without behavioral disturbance: Secondary | ICD-10-CM | POA: Diagnosis not present

## 2019-04-09 DIAGNOSIS — E871 Hypo-osmolality and hyponatremia: Secondary | ICD-10-CM | POA: Diagnosis not present

## 2019-04-09 DIAGNOSIS — E039 Hypothyroidism, unspecified: Secondary | ICD-10-CM | POA: Diagnosis not present

## 2019-04-09 DIAGNOSIS — E538 Deficiency of other specified B group vitamins: Secondary | ICD-10-CM | POA: Diagnosis not present

## 2019-05-05 DIAGNOSIS — F329 Major depressive disorder, single episode, unspecified: Secondary | ICD-10-CM | POA: Diagnosis not present

## 2019-05-05 DIAGNOSIS — G301 Alzheimer's disease with late onset: Secondary | ICD-10-CM | POA: Diagnosis not present

## 2019-05-05 DIAGNOSIS — E7849 Other hyperlipidemia: Secondary | ICD-10-CM | POA: Diagnosis not present

## 2019-05-05 DIAGNOSIS — G309 Alzheimer's disease, unspecified: Secondary | ICD-10-CM | POA: Diagnosis not present

## 2019-05-05 DIAGNOSIS — F0150 Vascular dementia without behavioral disturbance: Secondary | ICD-10-CM | POA: Diagnosis not present

## 2019-05-05 DIAGNOSIS — Z87891 Personal history of nicotine dependence: Secondary | ICD-10-CM | POA: Diagnosis not present

## 2019-05-05 DIAGNOSIS — Z7982 Long term (current) use of aspirin: Secondary | ICD-10-CM | POA: Diagnosis not present

## 2019-05-05 DIAGNOSIS — F0280 Dementia in other diseases classified elsewhere without behavioral disturbance: Secondary | ICD-10-CM | POA: Diagnosis not present

## 2019-05-05 DIAGNOSIS — I10 Essential (primary) hypertension: Secondary | ICD-10-CM | POA: Diagnosis not present

## 2019-05-05 DIAGNOSIS — E538 Deficiency of other specified B group vitamins: Secondary | ICD-10-CM | POA: Diagnosis not present

## 2019-05-05 DIAGNOSIS — Z9181 History of falling: Secondary | ICD-10-CM | POA: Diagnosis not present

## 2019-05-05 DIAGNOSIS — F419 Anxiety disorder, unspecified: Secondary | ICD-10-CM | POA: Diagnosis not present

## 2019-05-05 DIAGNOSIS — E039 Hypothyroidism, unspecified: Secondary | ICD-10-CM | POA: Diagnosis not present

## 2019-05-25 DIAGNOSIS — Z87891 Personal history of nicotine dependence: Secondary | ICD-10-CM | POA: Diagnosis not present

## 2019-05-25 DIAGNOSIS — F329 Major depressive disorder, single episode, unspecified: Secondary | ICD-10-CM | POA: Diagnosis not present

## 2019-05-25 DIAGNOSIS — I10 Essential (primary) hypertension: Secondary | ICD-10-CM | POA: Diagnosis not present

## 2019-05-25 DIAGNOSIS — Z7982 Long term (current) use of aspirin: Secondary | ICD-10-CM | POA: Diagnosis not present

## 2019-05-25 DIAGNOSIS — G301 Alzheimer's disease with late onset: Secondary | ICD-10-CM | POA: Diagnosis not present

## 2019-05-25 DIAGNOSIS — E7849 Other hyperlipidemia: Secondary | ICD-10-CM | POA: Diagnosis not present

## 2019-05-25 DIAGNOSIS — Z9181 History of falling: Secondary | ICD-10-CM | POA: Diagnosis not present

## 2019-05-25 DIAGNOSIS — E039 Hypothyroidism, unspecified: Secondary | ICD-10-CM | POA: Diagnosis not present

## 2019-05-25 DIAGNOSIS — F0150 Vascular dementia without behavioral disturbance: Secondary | ICD-10-CM | POA: Diagnosis not present

## 2019-05-25 DIAGNOSIS — F419 Anxiety disorder, unspecified: Secondary | ICD-10-CM | POA: Diagnosis not present

## 2019-05-25 DIAGNOSIS — F0280 Dementia in other diseases classified elsewhere without behavioral disturbance: Secondary | ICD-10-CM | POA: Diagnosis not present

## 2019-05-25 DIAGNOSIS — E538 Deficiency of other specified B group vitamins: Secondary | ICD-10-CM | POA: Diagnosis not present

## 2019-06-10 DIAGNOSIS — E538 Deficiency of other specified B group vitamins: Secondary | ICD-10-CM | POA: Diagnosis not present

## 2019-06-10 DIAGNOSIS — E039 Hypothyroidism, unspecified: Secondary | ICD-10-CM | POA: Diagnosis not present

## 2019-06-10 DIAGNOSIS — Z7982 Long term (current) use of aspirin: Secondary | ICD-10-CM | POA: Diagnosis not present

## 2019-06-10 DIAGNOSIS — Z87891 Personal history of nicotine dependence: Secondary | ICD-10-CM | POA: Diagnosis not present

## 2019-06-10 DIAGNOSIS — F0150 Vascular dementia without behavioral disturbance: Secondary | ICD-10-CM | POA: Diagnosis not present

## 2019-06-10 DIAGNOSIS — E7849 Other hyperlipidemia: Secondary | ICD-10-CM | POA: Diagnosis not present

## 2019-06-10 DIAGNOSIS — F419 Anxiety disorder, unspecified: Secondary | ICD-10-CM | POA: Diagnosis not present

## 2019-06-10 DIAGNOSIS — F329 Major depressive disorder, single episode, unspecified: Secondary | ICD-10-CM | POA: Diagnosis not present

## 2019-06-10 DIAGNOSIS — F0280 Dementia in other diseases classified elsewhere without behavioral disturbance: Secondary | ICD-10-CM | POA: Diagnosis not present

## 2019-06-10 DIAGNOSIS — Z9181 History of falling: Secondary | ICD-10-CM | POA: Diagnosis not present

## 2019-06-10 DIAGNOSIS — G301 Alzheimer's disease with late onset: Secondary | ICD-10-CM | POA: Diagnosis not present

## 2019-06-10 DIAGNOSIS — I10 Essential (primary) hypertension: Secondary | ICD-10-CM | POA: Diagnosis not present

## 2019-07-14 DIAGNOSIS — R2689 Other abnormalities of gait and mobility: Secondary | ICD-10-CM | POA: Diagnosis not present

## 2019-07-14 DIAGNOSIS — E538 Deficiency of other specified B group vitamins: Secondary | ICD-10-CM | POA: Diagnosis not present

## 2019-07-14 DIAGNOSIS — G939 Disorder of brain, unspecified: Secondary | ICD-10-CM | POA: Diagnosis not present

## 2019-07-14 DIAGNOSIS — G309 Alzheimer's disease, unspecified: Secondary | ICD-10-CM | POA: Diagnosis not present

## 2019-07-14 DIAGNOSIS — F0150 Vascular dementia without behavioral disturbance: Secondary | ICD-10-CM | POA: Diagnosis not present

## 2019-07-14 DIAGNOSIS — F0280 Dementia in other diseases classified elsewhere without behavioral disturbance: Secondary | ICD-10-CM | POA: Diagnosis not present

## 2019-08-13 DIAGNOSIS — E871 Hypo-osmolality and hyponatremia: Secondary | ICD-10-CM | POA: Diagnosis not present

## 2019-08-13 DIAGNOSIS — E039 Hypothyroidism, unspecified: Secondary | ICD-10-CM | POA: Diagnosis not present

## 2019-08-13 DIAGNOSIS — E538 Deficiency of other specified B group vitamins: Secondary | ICD-10-CM | POA: Diagnosis not present

## 2019-08-20 DIAGNOSIS — Z23 Encounter for immunization: Secondary | ICD-10-CM | POA: Diagnosis not present

## 2019-08-20 DIAGNOSIS — Z0000 Encounter for general adult medical examination without abnormal findings: Secondary | ICD-10-CM | POA: Diagnosis not present

## 2019-08-20 DIAGNOSIS — E039 Hypothyroidism, unspecified: Secondary | ICD-10-CM | POA: Diagnosis not present

## 2019-08-20 DIAGNOSIS — E538 Deficiency of other specified B group vitamins: Secondary | ICD-10-CM | POA: Diagnosis not present

## 2019-10-07 DIAGNOSIS — Z1231 Encounter for screening mammogram for malignant neoplasm of breast: Secondary | ICD-10-CM | POA: Diagnosis not present

## 2019-11-12 DIAGNOSIS — G309 Alzheimer's disease, unspecified: Secondary | ICD-10-CM | POA: Diagnosis not present

## 2019-11-12 DIAGNOSIS — R2689 Other abnormalities of gait and mobility: Secondary | ICD-10-CM | POA: Diagnosis not present

## 2019-11-12 DIAGNOSIS — E538 Deficiency of other specified B group vitamins: Secondary | ICD-10-CM | POA: Diagnosis not present

## 2019-11-12 DIAGNOSIS — Z7189 Other specified counseling: Secondary | ICD-10-CM | POA: Diagnosis not present

## 2019-11-12 DIAGNOSIS — F0150 Vascular dementia without behavioral disturbance: Secondary | ICD-10-CM | POA: Diagnosis not present

## 2019-11-12 DIAGNOSIS — F0280 Dementia in other diseases classified elsewhere without behavioral disturbance: Secondary | ICD-10-CM | POA: Diagnosis not present

## 2019-11-12 DIAGNOSIS — G939 Disorder of brain, unspecified: Secondary | ICD-10-CM | POA: Diagnosis not present

## 2019-12-18 DIAGNOSIS — Z961 Presence of intraocular lens: Secondary | ICD-10-CM | POA: Diagnosis not present

## 2020-02-11 DIAGNOSIS — E538 Deficiency of other specified B group vitamins: Secondary | ICD-10-CM | POA: Diagnosis not present

## 2020-02-11 DIAGNOSIS — E039 Hypothyroidism, unspecified: Secondary | ICD-10-CM | POA: Diagnosis not present

## 2020-02-17 DIAGNOSIS — Z0000 Encounter for general adult medical examination without abnormal findings: Secondary | ICD-10-CM | POA: Diagnosis not present

## 2020-02-17 DIAGNOSIS — E782 Mixed hyperlipidemia: Secondary | ICD-10-CM | POA: Diagnosis not present

## 2020-02-17 DIAGNOSIS — E039 Hypothyroidism, unspecified: Secondary | ICD-10-CM | POA: Diagnosis not present

## 2020-02-17 DIAGNOSIS — F330 Major depressive disorder, recurrent, mild: Secondary | ICD-10-CM | POA: Diagnosis not present

## 2020-02-17 DIAGNOSIS — E538 Deficiency of other specified B group vitamins: Secondary | ICD-10-CM | POA: Diagnosis not present

## 2020-02-17 DIAGNOSIS — M1A00X0 Idiopathic chronic gout, unspecified site, without tophus (tophi): Secondary | ICD-10-CM | POA: Diagnosis not present

## 2020-03-11 DIAGNOSIS — F0280 Dementia in other diseases classified elsewhere without behavioral disturbance: Secondary | ICD-10-CM | POA: Diagnosis not present

## 2020-03-11 DIAGNOSIS — G309 Alzheimer's disease, unspecified: Secondary | ICD-10-CM | POA: Diagnosis not present

## 2020-03-11 DIAGNOSIS — F0150 Vascular dementia without behavioral disturbance: Secondary | ICD-10-CM | POA: Diagnosis not present

## 2020-03-11 DIAGNOSIS — R2689 Other abnormalities of gait and mobility: Secondary | ICD-10-CM | POA: Diagnosis not present

## 2020-03-11 DIAGNOSIS — G939 Disorder of brain, unspecified: Secondary | ICD-10-CM | POA: Diagnosis not present

## 2020-04-21 ENCOUNTER — Emergency Department
Admission: EM | Admit: 2020-04-21 | Discharge: 2020-04-21 | Disposition: A | Discharge status: Home / Self Care | Payer: PPO | Attending: Emergency Medicine | Admitting: Emergency Medicine

## 2020-04-21 ENCOUNTER — Other Ambulatory Visit: Payer: Self-pay

## 2020-04-21 ENCOUNTER — Emergency Department: Payer: PPO

## 2020-04-21 DIAGNOSIS — E039 Hypothyroidism, unspecified: Secondary | ICD-10-CM | POA: Diagnosis not present

## 2020-04-21 DIAGNOSIS — W19XXXA Unspecified fall, initial encounter: Secondary | ICD-10-CM | POA: Insufficient documentation

## 2020-04-21 DIAGNOSIS — Y999 Unspecified external cause status: Secondary | ICD-10-CM | POA: Diagnosis not present

## 2020-04-21 DIAGNOSIS — F0390 Unspecified dementia without behavioral disturbance: Secondary | ICD-10-CM | POA: Insufficient documentation

## 2020-04-21 DIAGNOSIS — M4802 Spinal stenosis, cervical region: Secondary | ICD-10-CM | POA: Diagnosis not present

## 2020-04-21 DIAGNOSIS — Y939 Activity, unspecified: Secondary | ICD-10-CM | POA: Diagnosis not present

## 2020-04-21 DIAGNOSIS — S0003XA Contusion of scalp, initial encounter: Secondary | ICD-10-CM | POA: Diagnosis not present

## 2020-04-21 DIAGNOSIS — Y929 Unspecified place or not applicable: Secondary | ICD-10-CM | POA: Diagnosis not present

## 2020-04-21 DIAGNOSIS — M4319 Spondylolisthesis, multiple sites in spine: Secondary | ICD-10-CM | POA: Diagnosis not present

## 2020-04-21 DIAGNOSIS — S199XXA Unspecified injury of neck, initial encounter: Secondary | ICD-10-CM | POA: Diagnosis not present

## 2020-04-21 DIAGNOSIS — Z79890 Hormone replacement therapy (postmenopausal): Secondary | ICD-10-CM | POA: Insufficient documentation

## 2020-04-21 DIAGNOSIS — Z79899 Other long term (current) drug therapy: Secondary | ICD-10-CM | POA: Insufficient documentation

## 2020-04-21 DIAGNOSIS — G319 Degenerative disease of nervous system, unspecified: Secondary | ICD-10-CM | POA: Diagnosis not present

## 2020-04-21 DIAGNOSIS — I10 Essential (primary) hypertension: Secondary | ICD-10-CM | POA: Diagnosis not present

## 2020-04-21 DIAGNOSIS — M47812 Spondylosis without myelopathy or radiculopathy, cervical region: Secondary | ICD-10-CM | POA: Diagnosis not present

## 2020-04-21 DIAGNOSIS — Z87891 Personal history of nicotine dependence: Secondary | ICD-10-CM | POA: Diagnosis not present

## 2020-04-21 DIAGNOSIS — S0000XA Unspecified superficial injury of scalp, initial encounter: Secondary | ICD-10-CM | POA: Diagnosis present

## 2020-04-21 NOTE — Discharge Instructions (Addendum)
Please seek medical attention for any high fevers, chest pain, shortness of breath, change in behavior, persistent vomiting, bloody stool or any other new or concerning symptoms.  

## 2020-04-21 NOTE — ED Provider Notes (Signed)
[facility] Emergency Department Provider Note  ____________________________________________   I have reviewed the triage vital signs and the nursing notes.   HISTORY  Chief Complaint Fall   History limited by and level 5 caveat due to: Dementia   HPI Bethany Hicks is a 86 y.o. female who presents to the emergency department today after suffering a fall.  The patient unfortunately cannot remember exactly why she fell.  She thinks she might of been opening the door and took a step backwards and caused the fall.  She does remember falling and trying to catch herself.  She did hit her back of her head and states she has some pain there although did not lose consciousness.  She denies any pain in her arms or legs.  Daughter is at bedside and states the patient had an active day today.  Patient had not been complaining of any shortness of breath or weakness earlier in the day today.  She ate and drink her normal amount today.   Records reviewed. Per medical record review patient has a history of HTN, dementia.   Past Medical History:  Diagnosis Date  . Anxiety   . Fluid collection (edema) in the arms, legs, hands and feet   . History of panic attacks   . Hypertension   . Hypothyroidism     Patient Active Problem List   Diagnosis Date Noted  . HYPERLIPIDEMIA 07/21/2007  . ANXIETY 07/21/2007  . TOBACCO ABUSE 07/21/2007  . HYPERTENSION 07/21/2007  . ALLERGIC RHINITIS 07/21/2007  . GERD 07/21/2007  . DIVERTICULOSIS, COLON 07/21/2007  . OSTEOPENIA 07/21/2007  . ATRIAL ARRHYTHMIAS 07/04/2007  . SKIN LESION 07/04/2007  . Unspecified hypothyroidism 03/20/2007  . HYPERCHOLESTEROLEMIA, PURE 03/20/2007    Past Surgical History:  Procedure Laterality Date  . ABDOMINAL HYSTERECTOMY    . CATARACT EXTRACTION W/PHACO Left 03/07/2015   Procedure: CATARACT EXTRACTION PHACO AND INTRAOCULAR LENS PLACEMENT (IOC);  Surgeon: Estill Cotta, MD;  Location: [facility] ORS;   Service: Ophthalmology;  Laterality: Left;  [place] 00:57 AP% 24.6 CDE 24.44  . CHOLECYSTECTOMY    . EYE SURGERY     cataract    Prior to Admission medications   Medication Sig Start Date End Date Taking? Authorizing Provider  amLODipine (NORVASC) 5 MG tablet Take 2.5 mg by mouth 2 (two) times daily.    [provider]  cetirizine (ZYRTEC) 10 MG tablet Take 10 mg by mouth daily.    [provider]  cholecalciferol (VITAMIN D) 400 UNITS TABS tablet Take 1,000 Units by mouth daily.    [provider]  hydrochlorothiazide (HYDRODIURIL) 25 MG tablet Take 12.5 mg by mouth 2 (two) times daily.    [provider]  levothyroxine (SYNTHROID, LEVOTHROID) 100 MCG tablet Take 100 mcg by mouth daily before breakfast.    [provider]  Multiple Vitamin (MULTIVITAMIN) capsule Take 2 capsules by mouth daily.    [provider]  potassium chloride (K-DUR,KLOR-CON) 10 MEQ tablet Take 10 mEq by mouth once.    [provider]  potassium chloride (K-DUR,KLOR-CON) 10 MEQ tablet Take 1 tablet (10 mEq total) by mouth 2 (two) times daily for 7 days. 09/17/18 09/24/18  Harvest Dark, MD  sertraline (ZOLOFT) 50 MG tablet Take 50 mg by mouth at bedtime.    [provider]  sodium chloride 1 g tablet Take 1 tablet (1 g total) by mouth 3 (three) times daily. 09/17/18   Schuyler Amor, MD    Allergies Amlodipine  besylate, Calcitonin (salmon), Doxazosin mesylate, Hydrocod polst-cpm polst er, Raloxifene, Risedronate sodium, Codeine, and Singulair [montelukast]  No family history on file.  Social History Social History   Tobacco Use  . Smoking status: Former Smoker  Substance Use Topics  . Alcohol use: No  . Drug use: Not on file    Review of Systems Constitutional: No fever/chills Eyes: No visual changes. ENT: No sore throat. Cardiovascular: Denies chest pain. Respiratory: Denies shortness of breath. Gastrointestinal: No abdominal  pain.  No nausea, no vomiting.  No diarrhea.   Genitourinary: Negative for dysuria. Musculoskeletal: Negative for back pain. Skin: Negative for rash. Neurological: Positive for headache. ____________________________________________   PHYSICAL EXAM:  VITAL SIGNS: ED Triage Vitals  Enc Vitals Group     BP 04/21/20 2128 (!) 197/77     Pulse Rate 04/21/20 2128 61     Resp 04/21/20 2128 20     Temp 04/21/20 2128 97.8 F (36.6 C)     Temp Source 04/21/20 2128 Oral     SpO2 04/21/20 2128 96 %     Weight 04/21/20 2129 146 lb (66.2 kg)     Height --      Head Circumference --      Peak Flow --      Pain Score 04/21/20 2129 5   Constitutional: Awake and alert. Not completely oriented.  Eyes: Conjunctivae are normal.  ENT      Head: Normocephalic. Hematoma to left parietal healthcare.       Nose: No congestion/rhinnorhea.      Mouth/Throat: Mucous membranes are moist.      Neck: No stridor. No midline tenderness.  Hematological/Lymphatic/Immunilogical: No cervical lymphadenopathy. Cardiovascular: Normal rate, regular rhythm.  No murmurs, rubs, or gallops.  Respiratory: Normal respiratory effort without tachypnea nor retractions. Breath sounds are clear and equal bilaterally. No wheezes/rales/rhonchi. Gastrointestinal: Soft and non tender. No rebound. No guarding.  Genitourinary: Deferred Musculoskeletal: Normal range of motion in all extremities. No lower extremity edema. Neurologic:  Normal speech and language. No gross focal neurologic deficits are appreciated.  Skin:  Skin is warm, dry and intact. No rash noted. Psychiatric: Mood and affect are normal. Speech and behavior are normal. Patient exhibits appropriate insight and judgment.  ____________________________________________    LABS (pertinent positives/negatives)  None  ____________________________________________   EKG  None  ____________________________________________    RADIOLOGY  CT head No acute  intracranial abnormality. Left parietal hematoma.  CT cervical spine No acute osseous injury.  ____________________________________________   PROCEDURES  Procedures  ____________________________________________   INITIAL IMPRESSION / ASSESSMENT AND PLAN / ED COURSE  Pertinent labs & imaging results that were available during my care of the patient were reviewed by me and considered in my medical decision making (see chart for details).   Patient presented to the emergency department today after suffering a fall.  CT head and cervical spine without any concerning acute traumatic findings.  Discussed findings with patient and daughter.  We did discuss potentially checking blood work and urine.  However given that the patient has had a somewhat active day today and has not been complaining of any complaints family chose to defer blood work which I think is completely reasonable.  We also did discuss checking urine however patient has not been having any dysuria or bad odor to her urine.  Patient will follow up with primary care.  Patient will return for any new or concerning symptoms. ____________________________________________   FINAL CLINICAL IMPRESSION(S) / ED DIAGNOSES  Final diagnoses:  Fall, initial encounter  Hematoma of scalp, initial encounter     Note: This dictation was prepared with Dragon dictation. Any transcriptional errors that result from this process are unintentional     Nance Pear, MD 04/21/20 2234

## 2020-04-21 NOTE — ED Triage Notes (Signed)
Pt fell today states just lost her balance, states hit the back of her head. No loc, no other complaints of pain. Has been ambulatory without problems.

## 2020-04-21 NOTE — ED Notes (Signed)
Pt taken to CT.

## 2020-05-04 DIAGNOSIS — H16223 Keratoconjunctivitis sicca, not specified as Sjogren's, bilateral: Secondary | ICD-10-CM | POA: Diagnosis not present

## 2020-08-17 DIAGNOSIS — E782 Mixed hyperlipidemia: Secondary | ICD-10-CM | POA: Diagnosis not present

## 2020-08-17 DIAGNOSIS — M1A00X0 Idiopathic chronic gout, unspecified site, without tophus (tophi): Secondary | ICD-10-CM | POA: Diagnosis not present

## 2020-08-17 DIAGNOSIS — E538 Deficiency of other specified B group vitamins: Secondary | ICD-10-CM | POA: Diagnosis not present

## 2020-08-24 DIAGNOSIS — Z0000 Encounter for general adult medical examination without abnormal findings: Secondary | ICD-10-CM | POA: Diagnosis not present

## 2020-08-24 DIAGNOSIS — Z23 Encounter for immunization: Secondary | ICD-10-CM | POA: Diagnosis not present

## 2020-08-24 DIAGNOSIS — M1A00X0 Idiopathic chronic gout, unspecified site, without tophus (tophi): Secondary | ICD-10-CM | POA: Diagnosis not present

## 2020-08-24 DIAGNOSIS — E538 Deficiency of other specified B group vitamins: Secondary | ICD-10-CM | POA: Diagnosis not present

## 2020-08-24 DIAGNOSIS — E782 Mixed hyperlipidemia: Secondary | ICD-10-CM | POA: Diagnosis not present

## 2020-10-05 DIAGNOSIS — G939 Disorder of brain, unspecified: Secondary | ICD-10-CM | POA: Diagnosis not present

## 2020-10-05 DIAGNOSIS — R2689 Other abnormalities of gait and mobility: Secondary | ICD-10-CM | POA: Diagnosis not present

## 2020-10-05 DIAGNOSIS — F0150 Vascular dementia without behavioral disturbance: Secondary | ICD-10-CM | POA: Diagnosis not present

## 2020-10-05 DIAGNOSIS — F0280 Dementia in other diseases classified elsewhere without behavioral disturbance: Secondary | ICD-10-CM | POA: Diagnosis not present

## 2020-10-05 DIAGNOSIS — G309 Alzheimer's disease, unspecified: Secondary | ICD-10-CM | POA: Diagnosis not present

## 2020-10-13 DIAGNOSIS — U071 COVID-19: Secondary | ICD-10-CM | POA: Diagnosis not present

## 2020-10-13 DIAGNOSIS — J208 Acute bronchitis due to other specified organisms: Secondary | ICD-10-CM | POA: Diagnosis not present

## 2020-10-13 DIAGNOSIS — Z20822 Contact with and (suspected) exposure to covid-19: Secondary | ICD-10-CM | POA: Diagnosis not present

## 2020-10-17 DIAGNOSIS — J208 Acute bronchitis due to other specified organisms: Secondary | ICD-10-CM | POA: Diagnosis not present

## 2020-10-17 DIAGNOSIS — U071 COVID-19: Secondary | ICD-10-CM | POA: Diagnosis not present

## 2020-11-25 DIAGNOSIS — N390 Urinary tract infection, site not specified: Secondary | ICD-10-CM | POA: Diagnosis not present

## 2021-01-11 ENCOUNTER — Inpatient Hospital Stay
Admission: EM | Admit: 2021-01-11 | Discharge: 2021-01-17 | DRG: 494 | Disposition: A | Discharge status: Skilled Nursing Facility | Payer: PPO | Attending: Internal Medicine | Admitting: Internal Medicine

## 2021-01-11 ENCOUNTER — Other Ambulatory Visit: Payer: Self-pay

## 2021-01-11 ENCOUNTER — Encounter: Payer: Self-pay | Admitting: Internal Medicine

## 2021-01-11 ENCOUNTER — Emergency Department: Payer: PPO

## 2021-01-11 DIAGNOSIS — Z9842 Cataract extraction status, left eye: Secondary | ICD-10-CM | POA: Diagnosis not present

## 2021-01-11 DIAGNOSIS — Z8249 Family history of ischemic heart disease and other diseases of the circulatory system: Secondary | ICD-10-CM

## 2021-01-11 DIAGNOSIS — Z885 Allergy status to narcotic agent status: Secondary | ICD-10-CM | POA: Diagnosis not present

## 2021-01-11 DIAGNOSIS — R41841 Cognitive communication deficit: Secondary | ICD-10-CM | POA: Diagnosis not present

## 2021-01-11 DIAGNOSIS — Z736 Limitation of activities due to disability: Secondary | ICD-10-CM | POA: Diagnosis not present

## 2021-01-11 DIAGNOSIS — F0390 Unspecified dementia without behavioral disturbance: Secondary | ICD-10-CM | POA: Diagnosis present

## 2021-01-11 DIAGNOSIS — Z79899 Other long term (current) drug therapy: Secondary | ICD-10-CM | POA: Diagnosis not present

## 2021-01-11 DIAGNOSIS — S82891A Other fracture of right lower leg, initial encounter for closed fracture: Secondary | ICD-10-CM | POA: Diagnosis not present

## 2021-01-11 DIAGNOSIS — G9389 Other specified disorders of brain: Secondary | ICD-10-CM | POA: Diagnosis not present

## 2021-01-11 DIAGNOSIS — S82841A Displaced bimalleolar fracture of right lower leg, initial encounter for closed fracture: Principal | ICD-10-CM | POA: Diagnosis present

## 2021-01-11 DIAGNOSIS — S0003XA Contusion of scalp, initial encounter: Secondary | ICD-10-CM | POA: Diagnosis not present

## 2021-01-11 DIAGNOSIS — R5381 Other malaise: Secondary | ICD-10-CM | POA: Diagnosis not present

## 2021-01-11 DIAGNOSIS — E569 Vitamin deficiency, unspecified: Secondary | ICD-10-CM | POA: Diagnosis not present

## 2021-01-11 DIAGNOSIS — R2681 Unsteadiness on feet: Secondary | ICD-10-CM | POA: Diagnosis not present

## 2021-01-11 DIAGNOSIS — Y9301 Activity, walking, marching and hiking: Secondary | ICD-10-CM | POA: Diagnosis present

## 2021-01-11 DIAGNOSIS — W19XXXA Unspecified fall, initial encounter: Secondary | ICD-10-CM | POA: Diagnosis present

## 2021-01-11 DIAGNOSIS — K219 Gastro-esophageal reflux disease without esophagitis: Secondary | ICD-10-CM | POA: Diagnosis present

## 2021-01-11 DIAGNOSIS — I672 Cerebral atherosclerosis: Secondary | ICD-10-CM | POA: Diagnosis not present

## 2021-01-11 DIAGNOSIS — I10 Essential (primary) hypertension: Secondary | ICD-10-CM | POA: Diagnosis not present

## 2021-01-11 DIAGNOSIS — S93439A Sprain of tibiofibular ligament of unspecified ankle, initial encounter: Secondary | ICD-10-CM | POA: Diagnosis present

## 2021-01-11 DIAGNOSIS — W010XXA Fall on same level from slipping, tripping and stumbling without subsequent striking against object, initial encounter: Secondary | ICD-10-CM | POA: Diagnosis present

## 2021-01-11 DIAGNOSIS — Z20822 Contact with and (suspected) exposure to covid-19: Secondary | ICD-10-CM | POA: Diagnosis present

## 2021-01-11 DIAGNOSIS — F32A Depression, unspecified: Secondary | ICD-10-CM | POA: Diagnosis not present

## 2021-01-11 DIAGNOSIS — F410 Panic disorder [episodic paroxysmal anxiety] without agoraphobia: Secondary | ICD-10-CM | POA: Diagnosis present

## 2021-01-11 DIAGNOSIS — Z961 Presence of intraocular lens: Secondary | ICD-10-CM | POA: Diagnosis present

## 2021-01-11 DIAGNOSIS — S82891D Other fracture of right lower leg, subsequent encounter for closed fracture with routine healing: Secondary | ICD-10-CM | POA: Diagnosis not present

## 2021-01-11 DIAGNOSIS — Z79890 Hormone replacement therapy (postmenopausal): Secondary | ICD-10-CM

## 2021-01-11 DIAGNOSIS — Z888 Allergy status to other drugs, medicaments and biological substances status: Secondary | ICD-10-CM | POA: Diagnosis not present

## 2021-01-11 DIAGNOSIS — D519 Vitamin B12 deficiency anemia, unspecified: Secondary | ICD-10-CM | POA: Diagnosis not present

## 2021-01-11 DIAGNOSIS — I739 Peripheral vascular disease, unspecified: Secondary | ICD-10-CM | POA: Diagnosis not present

## 2021-01-11 DIAGNOSIS — S8291XA Unspecified fracture of right lower leg, initial encounter for closed fracture: Secondary | ICD-10-CM | POA: Diagnosis not present

## 2021-01-11 DIAGNOSIS — E039 Hypothyroidism, unspecified: Secondary | ICD-10-CM | POA: Diagnosis not present

## 2021-01-11 DIAGNOSIS — E785 Hyperlipidemia, unspecified: Secondary | ICD-10-CM | POA: Diagnosis not present

## 2021-01-11 DIAGNOSIS — M7989 Other specified soft tissue disorders: Secondary | ICD-10-CM | POA: Diagnosis not present

## 2021-01-11 DIAGNOSIS — R609 Edema, unspecified: Secondary | ICD-10-CM | POA: Diagnosis not present

## 2021-01-11 DIAGNOSIS — Z87891 Personal history of nicotine dependence: Secondary | ICD-10-CM

## 2021-01-11 DIAGNOSIS — M6281 Muscle weakness (generalized): Secondary | ICD-10-CM | POA: Diagnosis not present

## 2021-01-11 DIAGNOSIS — Z86718 Personal history of other venous thrombosis and embolism: Secondary | ICD-10-CM | POA: Diagnosis not present

## 2021-01-11 DIAGNOSIS — R279 Unspecified lack of coordination: Secondary | ICD-10-CM | POA: Diagnosis not present

## 2021-01-11 LAB — BASIC METABOLIC PANEL
Anion gap: 9 (ref 5–15)
BUN: 20 mg/dL (ref 8–23)
CO2: 26 mmol/L (ref 22–32)
Calcium: 8.7 mg/dL — ABNORMAL LOW (ref 8.9–10.3)
Chloride: 105 mmol/L (ref 98–111)
Creatinine, Ser: 0.83 mg/dL (ref 0.44–1.00)
GFR, Estimated: >60 mL/min (ref >60)
Glucose, Bld: 95 mg/dL (ref 70–99)
Potassium: 3.5 mmol/L (ref 3.5–5.1)
Sodium: 140 mmol/L (ref 135–145)

## 2021-01-11 LAB — TYPE AND SCREEN
ABO/RH(D): O POS
Antibody Screen: NEGATIVE

## 2021-01-11 LAB — RESP PANEL BY RT-PCR (FLU A&B, COVID) ARPGX2
Influenza A by PCR: NEGATIVE
Influenza B by PCR: NEGATIVE
SARS Coronavirus 2 by RT PCR: NEGATIVE

## 2021-01-11 LAB — PROTIME-INR
INR: 1 (ref 0.8–1.2)
Prothrombin Time: 12.9 seconds (ref 11.4–15.2)

## 2021-01-11 LAB — CBC WITH DIFFERENTIAL/PLATELET
Abs Immature Granulocytes: 0.03 10*3/uL (ref 0.00–0.07)
Basophils Absolute: 0 10*3/uL (ref 0.0–0.1)
Basophils Relative: 0 %
Eosinophils Absolute: 0.1 10*3/uL (ref 0.0–0.5)
Eosinophils Relative: 1 %
HCT: 38.2 % (ref 36.0–46.0)
Hemoglobin: 12.6 g/dL (ref 12.0–15.0)
Immature Granulocytes: 0 %
Lymphocytes Relative: 14 %
Lymphs Abs: 1.1 10*3/uL (ref 0.7–4.0)
MCH: 30.1 pg (ref 26.0–34.0)
MCHC: 33 g/dL (ref 30.0–36.0)
MCV: 91.4 fL (ref 80.0–100.0)
Monocytes Absolute: 0.7 10*3/uL (ref 0.1–1.0)
Monocytes Relative: 9 %
Neutro Abs: 5.7 10*3/uL (ref 1.7–7.7)
Neutrophils Relative %: 76 %
Platelets: 175 10*3/uL (ref 150–400)
RBC: 4.18 MIL/uL (ref 3.87–5.11)
RDW: 15.1 % (ref 11.5–15.5)
WBC: 7.6 10*3/uL (ref 4.0–10.5)
nRBC: 0 % (ref 0.0–0.2)

## 2021-01-11 LAB — APTT: aPTT: 27 seconds (ref 24–36)

## 2021-01-11 LAB — BRAIN NATRIURETIC PEPTIDE: B Natriuretic Peptide: 126.5 pg/mL — ABNORMAL HIGH (ref 0.0–100.0)

## 2021-01-11 MED ORDER — VITAMIN D 25 MCG (1000 UNIT) PO TABS
1000.0000 [IU] | ORAL_TABLET | Freq: Every day | ORAL | Status: DC
  Administered 2021-01-11 – 2021-01-17 (×6): 1000 [IU] via ORAL
  Filled 2021-01-11 (×6): qty 1

## 2021-01-11 MED ORDER — CYANOCOBALAMIN 1000 MCG/ML IJ SOLN
1000.0000 ug | INTRAMUSCULAR | Status: DC
  Filled 2021-01-11: qty 1

## 2021-01-11 MED ORDER — LEVOTHYROXINE SODIUM 88 MCG PO TABS
88.0000 ug | ORAL_TABLET | Freq: Every morning | ORAL | Status: DC
  Administered 2021-01-12 – 2021-01-17 (×6): 88 ug via ORAL
  Filled 2021-01-11 (×8): qty 1

## 2021-01-11 MED ORDER — METHOCARBAMOL 500 MG PO TABS
500.0000 mg | ORAL_TABLET | Freq: Three times a day (TID) | ORAL | Status: DC | PRN
  Administered 2021-01-15: 500 mg via ORAL
  Filled 2021-01-11 (×2): qty 1

## 2021-01-11 MED ORDER — HYDRALAZINE HCL 20 MG/ML IJ SOLN: 5.0000 mg | INTRAMUSCULAR | Status: DC | PRN

## 2021-01-11 MED ORDER — OXYCODONE-ACETAMINOPHEN 5-325 MG PO TABS
1.0000 | ORAL_TABLET | ORAL | Status: DC | PRN
  Administered 2021-01-11 – 2021-01-13 (×5): 1 via ORAL
  Filled 2021-01-11 (×6): qty 1

## 2021-01-11 MED ORDER — SODIUM CHLORIDE 1 G PO TABS
1.0000 g | ORAL_TABLET | Freq: Three times a day (TID) | ORAL | Status: DC
  Administered 2021-01-11 – 2021-01-17 (×15): 1 g via ORAL
  Filled 2021-01-11 (×20): qty 1

## 2021-01-11 MED ORDER — ADULT MULTIVITAMIN W/MINERALS CH
1.0000 | ORAL_TABLET | Freq: Every day | ORAL | Status: DC
  Administered 2021-01-11 – 2021-01-17 (×6): 1 via ORAL
  Filled 2021-01-11 (×6): qty 1

## 2021-01-11 MED ORDER — MORPHINE SULFATE (PF) 2 MG/ML IV SOLN
0.5000 mg | INTRAVENOUS | Status: DC | PRN
  Administered 2021-01-11: 0.5 mg via INTRAVENOUS
  Filled 2021-01-11: qty 1

## 2021-01-11 MED ORDER — ONDANSETRON HCL 4 MG/2ML IJ SOLN: 4.0000 mg | Freq: Three times a day (TID) | INTRAMUSCULAR | Status: DC | PRN

## 2021-01-11 MED ORDER — RIVASTIGMINE TARTRATE 3 MG PO CAPS
3.0000 mg | ORAL_CAPSULE | Freq: Two times a day (BID) | ORAL | Status: DC
  Administered 2021-01-11 – 2021-01-17 (×12): 3 mg via ORAL
  Filled 2021-01-11 (×15): qty 1

## 2021-01-11 MED ORDER — AMLODIPINE BESYLATE 5 MG PO TABS
2.5000 mg | ORAL_TABLET | Freq: Every day | ORAL | Status: DC
  Administered 2021-01-11 – 2021-01-13 (×3): 2.5 mg via ORAL
  Filled 2021-01-11 (×3): qty 1

## 2021-01-11 MED ORDER — MORPHINE SULFATE (PF) 2 MG/ML IV SOLN
2.0000 mg | Freq: Once | INTRAVENOUS | Status: AC
  Administered 2021-01-11: 2 mg via INTRAVENOUS
  Filled 2021-01-11: qty 1

## 2021-01-11 MED ORDER — MEMANTINE HCL 5 MG PO TABS
5.0000 mg | ORAL_TABLET | Freq: Two times a day (BID) | ORAL | Status: DC
  Administered 2021-01-11 – 2021-01-17 (×12): 5 mg via ORAL
  Filled 2021-01-11 (×12): qty 1

## 2021-01-11 MED ORDER — PAROXETINE HCL 10 MG PO TABS
10.0000 mg | ORAL_TABLET | Freq: Two times a day (BID) | ORAL | Status: DC
  Administered 2021-01-11 – 2021-01-17 (×12): 10 mg via ORAL
  Filled 2021-01-11 (×15): qty 1

## 2021-01-11 MED ORDER — ACETAMINOPHEN 325 MG PO TABS
650.0000 mg | ORAL_TABLET | Freq: Four times a day (QID) | ORAL | Status: DC | PRN
  Administered 2021-01-11 – 2021-01-12 (×2): 650 mg via ORAL
  Filled 2021-01-11 (×2): qty 2

## 2021-01-11 MED ORDER — SENNOSIDES-DOCUSATE SODIUM 8.6-50 MG PO TABS: 1.0000 | ORAL_TABLET | Freq: Every evening | ORAL | Status: DC | PRN

## 2021-01-11 NOTE — H&P (Signed)
History and Physical    Bethany Hicks YJE:[id number] DOB: 05-07-1928 DOA: 01/11/2021  Referring MD/NP/PA:   PCP: Rusty Aus, MD   Patient coming from:  The patient is coming from home.  At baseline, pt is independent for most of ADL.        Chief Complaint: fall and right ankle pain  HPI: Bethany Hicks is a 87 y.o. female with medical history significant of hypertension, hyperlipidemia, GERD, hypothyroidism, depression, anxiety, panic attack, tobacco, atrial arrhythmia?, who presents with fall and right ankle pain.  Per her daughter at the bedside, patient fell when she was walking to the bathroom, and slipped on something wet on the floor. No LOC. She injured her right ankle, causing swelling and pain in right ankle. The pain is constant, moderate to severe, sharp, nonradiating. No leg numbness.  She also injured the back of head and developed a hematoma in the back of her head. No neck pain.  No unilateral numbness or tingling in extremities.  No facial droop or slurred speech, denies dizziness or lightheadedness.  Patient does not have chest pain, cough, shortness.  No fever or chills.  No nausea, vomiting, diarrhea or abdominal pain.  Patient does not have history of heart attack or congestive heart failure.   ED Course: pt was found to have WBC 7.6, pending Covid PCR, electrolytes renal function okay, INR 1.0, temperature normal, blood pressure 104/81, 150/73, heart rate 59, RR 20, oxygen saturation 98% on room air, CT of the head showed posterior scalp hematoma, negative for acute intracranial abnormalities.  Patient is admitted to [facility] bed as inpatient.  Dr. Marry Guan of Ortho is consulted.  X-ray of right ankle showed multiple complex fractures about the right ankle with severe disruption of the tibiotalar joint space.     Review of Systems:   General: no fevers, chills, no body weight gain, has fatigue HEENT: no blurry vision, hearing changes or sore throat Respiratory: no  dyspnea, coughing, wheezing CV: no chest pain, no palpitations GI: no nausea, vomiting, abdominal pain, diarrhea, constipation GU: no dysuria, burning on urination, increased urinary frequency, hematuria  Ext: has mild leg edema Neuro: no unilateral weakness, numbness, or tingling, no vision change or hearing loss. Has fall. Skin: no rash, no skin tear. MSK: has pain in right ankle Heme: No easy bruising.  Travel history: No recent long distant travel.  Allergy:  Allergies  Allergen Reactions  . Amlodipine Besylate     REACTION: Fatigue  . Calcitonin ([place])   . Doxazosin Mesylate     REACTION: Urine incont.  . Hydrocod Polst-Cpm Polst Er   . Raloxifene     REACTION: Pain  . Risedronate Sodium     REACTION: GI  . Codeine Anxiety  . Singulair [Montelukast] Anxiety    Past Medical History:  Diagnosis Date  . Anxiety   . Fluid collection (edema) in the arms, legs, hands and feet   . History of panic attacks   . Hypertension   . Hypothyroidism     Past Surgical History:  Procedure Laterality Date  . ABDOMINAL HYSTERECTOMY    . CATARACT EXTRACTION W/PHACO Left 03/07/2015   Procedure: CATARACT EXTRACTION PHACO AND INTRAOCULAR LENS PLACEMENT (IOC);  Surgeon: Estill Cotta, MD;  Location: [facility] ORS;  Service: Ophthalmology;  Laterality: Left;  [place] 00:57 AP% 24.6 CDE 24.44  . CHOLECYSTECTOMY    . EYE SURGERY     cataract    Social History:  reports that she has quit  smoking. She does not have any smokeless tobacco history on file. She reports that she does not drink alcohol. No history on file for drug use.  Family History:  Family History  Problem Relation Age of Onset  . Heart attack Brother      Prior to Admission medications   Medication Sig Start Date End Date Taking? Authorizing Provider  amLODipine (NORVASC) 5 MG tablet Take 2.5 mg by mouth 2 (two) times daily.    [provider]  cetirizine (ZYRTEC) 10 MG tablet Take 10 mg by mouth daily.     [provider]  cholecalciferol (VITAMIN D) 400 UNITS TABS tablet Take 1,000 Units by mouth daily.    [provider]  hydrochlorothiazide (HYDRODIURIL) 25 MG tablet Take 12.5 mg by mouth 2 (two) times daily.    [provider]  levothyroxine (SYNTHROID, LEVOTHROID) 100 MCG tablet Take 100 mcg by mouth daily before breakfast.    [provider]  Multiple Vitamin (MULTIVITAMIN) capsule Take 2 capsules by mouth daily.    [provider]  potassium chloride (K-DUR,KLOR-CON) 10 MEQ tablet Take 10 mEq by mouth once.    [provider]  potassium chloride (K-DUR,KLOR-CON) 10 MEQ tablet Take 1 tablet (10 mEq total) by mouth 2 (two) times daily for 7 days. 09/17/18 09/24/18  Harvest Dark, MD  sertraline (ZOLOFT) 50 MG tablet Take 50 mg by mouth at bedtime.    [provider]  sodium chloride 1 g tablet Take 1 tablet (1 g total) by mouth 3 (three) times daily. 09/17/18   Schuyler Amor, MD    Physical Exam: Vitals:   01/11/21 0634 01/11/21 0830 01/11/21 0900 01/11/21 1030  BP: (!) 204/81 (!) 164/78 (!) 156/85 (!) 141/74  Pulse:  (!) 54 (!) 57 72  Resp:  15 20 (!) 22  Temp:      TempSrc:      SpO2:  97% 90% 96%  Weight:      Height:       General: Not in acute distress HEENT: has a small hematoma in the back of head       Eyes: PERRL, EOMI, no scleral icterus.       ENT: No discharge from the ears and nose, no pharynx injection, no tonsillar enlargement.        Neck: No JVD, no bruit, no mass felt. Heme: No neck lymph node enlargement. Cardiac: S1/S2, RRR, has soft systolic murmurs 1/6, No gallops or rubs. Respiratory: No rales, wheezing, rhonchi or rubs. GI: Soft, nondistended, nontender, no rebound pain, no organomegaly, BS present. GU: No hematuria Ext: has trace leg edema bilaterally. 1+DP/PT pulse bilaterally. Musculoskeletal: has swelling and tenderness in the right ankle Skin: No rashes.  Neuro: Alert, oriented  X3, cranial nerves II-XII grossly intact, moves all extremities. Psych: Patient is not psychotic, no suicidal or hemocidal ideation.  Labs on Admission: I have personally reviewed following labs and imaging studies  CBC: Recent Labs  Lab 01/11/21 0813  WBC 7.6  NEUTROABS 5.7  HGB 12.6  HCT 38.2  MCV 91.4  PLT 784   Basic Metabolic Panel: Recent Labs  Lab 01/11/21 0813  NA 140  K 3.5  CL 105  CO2 26  GLUCOSE 95  BUN 20  CREATININE 0.83  CALCIUM 8.7*   GFR: Estimated Creatinine Clearance: 45.5 mL/min (by C-G formula based on SCr of 0.83 mg/dL). Liver Function Tests: No results for input(s): AST, ALT, ALKPHOS, BILITOT, PROT, ALBUMIN in the last 168  hours. No results for input(s): LIPASE, AMYLASE in the last 168 hours. No results for input(s): AMMONIA in the last 168 hours. Coagulation Profile: Recent Labs  Lab 01/11/21 0813  INR 1.0   Cardiac Enzymes: No results for input(s): CKTOTAL, CKMB, CKMBINDEX, TROPONINI in the last 168 hours. BNP (last 3 results) No results for input(s): PROBNP in the last 8760 hours. HbA1C: No results for input(s): HGBA1C in the last 72 hours. CBG: No results for input(s): GLUCAP in the last 168 hours. Lipid Profile: No results for input(s): CHOL, HDL, LDLCALC, TRIG, CHOLHDL, LDLDIRECT in the last 72 hours. Thyroid Function Tests: No results for input(s): TSH, T4TOTAL, FREET4, T3FREE, THYROIDAB in the last 72 hours. Anemia Panel: No results for input(s): VITAMINB12, FOLATE, FERRITIN, TIBC, IRON, RETICCTPCT in the last 72 hours. Urine analysis:    Component Value Date/Time   COLORURINE YELLOW (A) 09/17/2018 1903   APPEARANCEUR CLOUDY (A) 09/17/2018 1903   APPEARANCEUR Clear 05/04/2012 0613   LABSPEC 1.015 09/17/2018 1903   LABSPEC 1.014 05/04/2012 0613   PHURINE 5.0 09/17/2018 1903   GLUCOSEU NEGATIVE 09/17/2018 1903   GLUCOSEU Negative 05/04/2012 0613   HGBUR SMALL (A) 09/17/2018 1903   BILIRUBINUR NEGATIVE 09/17/2018 1903    BILIRUBINUR Negative 05/04/2012 [place] 09/17/2018 1903   PROTEINUR 30 (A) 09/17/2018 1903   NITRITE NEGATIVE 09/17/2018 1903   LEUKOCYTESUR MODERATE (A) 09/17/2018 1903   LEUKOCYTESUR Negative 05/04/2012 0613   Sepsis Labs: @LABRCNTIP (procalcitonin:4,lacticidven:4) )No results found for this or any previous visit (from the past 240 hour(s)).   Radiological Exams on Admission: DG Ankle Complete Right  Result Date: 01/11/2021 CLINICAL DATA:  Injury.  Swelling and pain. EXAM: RIGHT ANKLE - COMPLETE 3+ VIEW COMPARISON:  No prior. FINDINGS: Comminuted angulated fracture noted of the distal right fibular shaft. Small avulsion fracture noted along the distal tip of the lateral malleolus. Displaced angulated fracture noted of the medial malleolus. Prominent fracture fragment noted over the lateral aspect of the distal tibial epiphysis. A fracture of the posterior malleolus cannot be completely excluded. Fracture fragment age undetermined noted medial to the talus. Severe disruption of the tibiotalar joint space. IMPRESSION: Multiple complex fractures about the right ankle with severe disruption of the tibiotalar joint space as described above. CT of the right ankle may prove useful for further evaluation. Electronically Signed   By: Marcello Moores  Register   On: 01/11/2021 07:19   CT Head Wo Contrast  Result Date: 01/11/2021 CLINICAL DATA:  Slipped on wet floor, swelling hematoma posteriorly. EXAM: CT HEAD WITHOUT CONTRAST TECHNIQUE: Contiguous axial images were obtained from the base of the skull through the vertex without intravenous contrast. COMPARISON:  CT 04/21/2020, MR 03/02/2019 FINDINGS: Brain: No evidence of acute infarction, hemorrhage, hydrocephalus, extra-axial collection, visible mass lesion or mass effect. Symmetric prominence of the ventricles, cisterns and sulci compatible with parenchymal volume loss. Patchy areas of white matter hypoattenuation are most compatible with  chronic microvascular angiopathy. Stable benign dural calcifications. Vascular: Atherosclerotic calcification of the carotid siphons and intradural vertebral arteries. No hyperdense vessel. Skull: High posterior midline parietal scalp swelling and thickening with thin crescentic hematoma scalp hematoma measuring up to 4 mm in maximal thickness. No subjacent calvarial fracture. Mild hyperostosis frontalis interna is a benign, typically incidental finding. Sinuses/Orbits: Redemonstration of a trace right effusion with hyperostotic changes favoring chronicity. Paranasal sinuses and mastoid air cells are otherwise predominantly clear. Orbital structures are unremarkable aside from prior lens extractions. Other: Bilateral TMJ arthrosis. IMPRESSION: 1. High posterior midline  parietal scalp swelling and thickening with thin crescentic hematoma scalp hematoma measuring up to 4 mm in maximal thickness. No subjacent calvarial fracture. 2. No acute intracranial abnormality. 3. Chronic microvascular angiopathy and parenchymal volume loss. 4. Bilateral TMJ arthrosis. Electronically Signed   By: Lovena Le M.D.   On: 01/11/2021 06:57     EKG: I have personally reviewed.  Sinus rhythm, QTC 464, borderline LAD, nonspecific T wave change  Assessment/Plan Principal Problem:   Closed right ankle fracture Active Problems:   Hypothyroidism   HLD (hyperlipidemia)   Essential hypertension   Depression   Fall   Closed right ankle fracture: X-ray showed multiple complex fractures about the right ankle with severe disruption of the tibiotalar joint space as described above. Patient has moderate pain now. No neurovascular compromise. Orthopedic surgeon, Dr. Marry Guan was consulted, planning to do surgery tomorrow.  - will admit to Med-surg bed - Pain control: morphine prn and percocet - When necessary Zofran for nausea - Robaxin for muscle spasm - type and cross - INR/PTT  Hypothyroidism -Synthroid  HLD  (hyperlipidemia): Not taking medications currently -Follow-up with PCP  Essential hypertension -As needed hydralazine IV -Amlodipine  Depression: -Paxil  Fall - PT/OT when able to (not ordered now)  Memory loss/dementia? -Namenda, Exelon   Perioperative Cardiac Risk: pt has multiple comorbidities, but no hx of CAD or CHF. No hx of COPD. Currently patient is active and independent of ADLs and, IADLs. She has home health aid coming to help her Monday to Friday. On physical examination, patient has mild soft systolic murmur 1/6 and has trace leg edema, but pt has no recent acute cardiac issues.  Patient does not have chest pain, shortness of breath and palpitation. No signs of acute CHF exacerbation currently.  EKG has no acute change. At this time point, no further work up is needed. Patient's GUPTA score perioperative myocardial infarction or cardaic arrest is 1%.  I discussed the risk with patient and her daughter, pt would like to proceed for surgery.    DVT ppx: SCD Code Status: Full code Family Communication:   Yes, patient's daughter   at bed side Disposition Plan:  Anticipate discharge back to previous environment Consults called: Dr. Marry Guan of orthopedic surgery Admission status and Level of care: Med-Surg:   as inpt     Status is: Inpatient  Remains inpatient appropriate because:Inpatient level of care appropriate due to severity of illness   Dispo: The patient is from: Home              Anticipated d/c is to: to be determined              Patient currently is not medically stable to d/c.   Difficult to place patient No            Date of Service 01/11/2021    [facility]   If 7PM-7AM, please contact night-coverage [email or web address] 01/11/2021, 10:48 AM

## 2021-01-11 NOTE — ED Triage Notes (Signed)
Pt slipped on wet floor going to bathroom and has swelling and pain to right ankle. Pt also has hematoma to back of head, no loc.

## 2021-01-11 NOTE — ED Provider Notes (Signed)
[facility] Emergency Department Provider Note ____________________________________________   Event Date/Time   First MD Initiated Contact with Patient 01/11/21 0710     (approximate)  I have reviewed the triage vital signs and the nursing notes.   HISTORY  Chief Complaint Fall    HPI Bethany Hicks is a 87 y.o. female with PMH as noted below who presents with right ankle and head injuries after a mechanical fall from standing height.  Per the patient and daughter, the patient was walking to the bathroom early this morning.  She may have slipped on something wet on the floor.  She did not lose consciousness or feel dizzy or lightheaded.  She reports pain to the left ankle in the back of her head but denies other injuries.  Past Medical History:  Diagnosis Date  . Anxiety   . Fluid collection (edema) in the arms, legs, hands and feet   . History of panic attacks   . Hypertension   . Hypothyroidism     Patient Active Problem List   Diagnosis Date Noted  . Closed right ankle fracture 01/11/2021  . Depression 01/11/2021  . Fall 01/11/2021  . HLD (hyperlipidemia) 07/21/2007  . ANXIETY 07/21/2007  . TOBACCO ABUSE 07/21/2007  . Essential hypertension 07/21/2007  . ALLERGIC RHINITIS 07/21/2007  . GERD 07/21/2007  . DIVERTICULOSIS, COLON 07/21/2007  . OSTEOPENIA 07/21/2007  . ATRIAL ARRHYTHMIAS 07/04/2007  . SKIN LESION 07/04/2007  . Hypothyroidism 03/20/2007  . HYPERCHOLESTEROLEMIA, PURE 03/20/2007    Past Surgical History:  Procedure Laterality Date  . ABDOMINAL HYSTERECTOMY    . CATARACT EXTRACTION W/PHACO Left 03/07/2015   Procedure: CATARACT EXTRACTION PHACO AND INTRAOCULAR LENS PLACEMENT (IOC);  Surgeon: Estill Cotta, MD;  Location: [facility] ORS;  Service: Ophthalmology;  Laterality: Left;  [place] 00:57 AP% 24.6 CDE 24.44  . CHOLECYSTECTOMY    . EYE SURGERY     cataract    Prior to Admission medications   Medication Sig Start Date End Date  Taking? Authorizing Provider  amLODipine (NORVASC) 5 MG tablet Take 2.5 mg by mouth 2 (two) times daily.    [provider]  cetirizine (ZYRTEC) 10 MG tablet Take 10 mg by mouth daily.    [provider]  cholecalciferol (VITAMIN D) 400 UNITS TABS tablet Take 1,000 Units by mouth daily.    [provider]  hydrochlorothiazide (HYDRODIURIL) 25 MG tablet Take 12.5 mg by mouth 2 (two) times daily.    [provider]  levothyroxine (SYNTHROID, LEVOTHROID) 100 MCG tablet Take 100 mcg by mouth daily before breakfast.    [provider]  Multiple Vitamin (MULTIVITAMIN) capsule Take 2 capsules by mouth daily.    [provider]  potassium chloride (K-DUR,KLOR-CON) 10 MEQ tablet Take 10 mEq by mouth once.    [provider]  potassium chloride (K-DUR,KLOR-CON) 10 MEQ tablet Take 1 tablet (10 mEq total) by mouth 2 (two) times daily for 7 days. 09/17/18 09/24/18  Harvest Dark, MD  sertraline (ZOLOFT) 50 MG tablet Take 50 mg by mouth at bedtime.    [provider]  sodium chloride 1 g tablet Take 1 tablet (1 g total) by mouth 3 (three) times daily. 09/17/18   Schuyler Amor, MD    Allergies Amlodipine besylate, Calcitonin (salmon), Doxazosin mesylate, Hydrocod polst-cpm polst er, Raloxifene, Risedronate sodium, Codeine, and Singulair [montelukast]  No family history on file.  Social History Social History   Tobacco Use  . Smoking status: Former Smoker  Substance  Use Topics  . Alcohol use: No    Review of Systems  Constitutional: No fever. Eyes: No redness. ENT: No neck pain Cardiovascular: Denies chest pain. Respiratory: Denies shortness of breath. Gastrointestinal: No vomiting. Genitourinary: Negative for flank pain. Musculoskeletal: Negative for back pain. Skin: Negative for rash. Neurological: Negative for focal weakness or numbness.   ____________________________________________   PHYSICAL  EXAM:  VITAL SIGNS: ED Triage Vitals  Enc Vitals Group     BP 01/11/21 0634 (!) 204/81     Pulse Rate 01/11/21 0630 (!) 59     Resp 01/11/21 0630 20     Temp 01/11/21 0630 98.3 F (36.8 C)     Temp Source 01/11/21 0630 Oral     SpO2 01/11/21 0630 98 %     Weight 01/11/21 0628 164 lb (74.4 kg)     Height 01/11/21 0628 5\' 5"  (1.651 m)     Head Circumference --      Peak Flow --      Pain Score 01/11/21 0627 5     Pain Loc --      Pain Edu? --      Excl. in [place]? --     Constitutional: Alert and oriented. Well appearing for age and in no acute distress. Eyes: Conjunctivae are normal.  Head: Approximately 8 cm posterior scalp hematoma. Nose: No congestion/rhinnorhea. Mouth/Throat: Mucous membranes are moist.   Neck: Normal range of motion.  Cardiovascular: Normal rate, regular rhythm. Good peripheral circulation. Respiratory: Normal respiratory effort.  No retractions. Gastrointestinal: No distention.  Musculoskeletal: No lower extremity edema.  Extremities warm and well perfused.  Left ankle deformity.  2+ DP pulse and cap refill less than 2 seconds. Neurologic:  Normal speech and language.  Motor intact in all extremities.  Normal coordination.  No gross focal neurologic deficits are appreciated.  Skin:  Skin is warm and dry. No rash noted. Psychiatric: Mood and affect are normal. Speech and behavior are normal.  ____________________________________________   LABS (all labs ordered are listed, but only abnormal results are displayed)  Labs Reviewed  BASIC METABOLIC PANEL - Abnormal; Notable for the following components:      Result Value   Calcium 8.7 (*)    All other components within normal limits  SARS CORONAVIRUS 2 (TAT 6-24 HRS)  CBC WITH DIFFERENTIAL/PLATELET  PROTIME-INR  APTT  TYPE AND SCREEN   ____________________________________________  EKG  ED ECG REPORT I, Arta Silence, the attending physician, personally viewed and interpreted this  ECG.  Date: 01/11/2021 EKG Time: 0820 Rate: 60 Rhythm: normal sinus rhythm QRS Axis: normal Intervals: normal ST/T Wave abnormalities: Nonspecific T wave abnormalities Narrative Interpretation: no evidence of acute ischemia  ____________________________________________  RADIOLOGY  CT head: Posterior scalp hematoma with no intracranial abnormality XR right ankle: Multiple fractures  ____________________________________________   PROCEDURES  Procedure(s) performed: No  Procedures  Critical Care performed: No ____________________________________________   INITIAL IMPRESSION / ASSESSMENT AND PLAN / ED COURSE  Pertinent labs & imaging results that were available during my care of the patient were reviewed by me and considered in my medical decision making (see chart for details).  87 year old female with PMH as noted above presents with a right ankle injury after mechanical fall.  She also hit her head but did not lose consciousness.  On exam, the patient is quite well-appearing for her age.  She is hypertensive with otherwise normal vital signs.  She has a posterior scalp hematoma.  Neurologic exam is nonfocal.  There is  a deformity to the right ankle but the foot is neuro/vascular intact.  X-ray demonstrates multiple complex right ankle fractures.  CT head was obtained and is negative.  There is no evidence of cervical spine or other spinal injury.  We will consult orthopedics.  ----------------------------------------- 10:08 AM on 01/11/2021 -----------------------------------------  I consulted Dr. Marry Guan from orthopedic surgery who will evaluate the patient.  He recommended admission with plan for likely surgery tomorrow.  I then consulted Dr. Blaine Hamper from the hospitalist service for admission.  __________________________  Charyl Dancer was evaluated in Emergency Department on 01/11/2021 for the symptoms described in the history of present illness. She was evaluated in the  context of the global COVID-19 pandemic, which necessitated consideration that the patient might be at risk for infection with the SARS-CoV-2 virus that causes COVID-19. Institutional protocols and algorithms that pertain to the evaluation of patients at risk for COVID-19 are in a state of rapid change based on information released by regulatory bodies including the CDC and federal and state organizations. These policies and algorithms were followed during the patient's care in the ED. ____________________________________________   FINAL CLINICAL IMPRESSION(S) / ED DIAGNOSES  Final diagnoses:  Closed fracture of right ankle, initial encounter      NEW MEDICATIONS STARTED DURING THIS VISIT:  New Prescriptions   No medications on file     Note:  This document was prepared using Dragon voice recognition software and may include unintentional dictation errors.    Arta Silence, MD 01/11/21 1008

## 2021-01-11 NOTE — Progress Notes (Signed)
  Subjective:   BIMALLEOLAR ANKLE FRACTURE, RIGHT  [name] at bedside. Reports pain is well-controlled at this time. Patient was admitted from the ED but states she was not placed in any splint. Denies numbness, tingling in the foot.   Objective: Vital signs in last 24 hours: Temp:  [98.3 F (36.8 C)-98.8 F (37.1 C)] 98.7 F (37.1 C) (03/23 1527) Pulse Rate:  [54-72] 62 (03/23 1527) Resp:  [15-22] 18 (03/23 1527) BP: (140-204)/(70-104) 162/70 (03/23 1527) SpO2:  [90 %-98 %] 97 % (03/23 1527) Weight:  [74.4 kg] 74.4 kg (03/23 0628)      Intake/Output from previous day:  Intake/Output Summary (Last 24 hours) at 01/11/2021 1755 Last data filed at 01/11/2021 1405 Gross per 24 hour  Intake 240 ml  Output --  Net 240 ml    Intake/Output this shift: Total I/O In: 240 [P.O.:240] Out: -   Labs: Recent Labs    01/11/21 0813  HGB 12.6   Recent Labs    01/11/21 0813  WBC 7.6  RBC 4.18  HCT 38.2  PLT 175   Recent Labs    01/11/21 0813  NA 140  K 3.5  CL 105  CO2 26  BUN 20  CREATININE 0.83  GLUCOSE 95  CALCIUM 8.7*   Recent Labs    01/11/21 0813  INR 1.0     EXAM General - Patient is Alert, Appropriate and Oriented Extremity - R ankle wrapped in pillowcase with PolarCare applied, no splint present; gross deformity of ankle noted; mild edema noted over ankle, cap refill <2 seconds Motor Function - able to flex and extend toes     Assessment/Plan:    Principal Problem:   Closed right ankle fracture Active Problems:   Hypothyroidism   HLD (hyperlipidemia)   Essential hypertension   Depression   Fall  Estimated body mass index is 27.29 kg/m as calculated from the following:   Height as of this encounter: 5\' 5"  (1.651 m).   Weight as of this encounter: 74.4 kg.   BIMALLEOLAR ANKLE FRACTURE sustained 01/11/21 Fracture reduced with Dr Rudene Christians bedside. Stirrup splint applied. Fracture remains subluxed. Tentative plan for ORIF on Friday by Dr  Marry Guan. Patient and daughter understand.   Polar Care reapplied.    NonWB to  right LE.   Cassell Smiles, PA-C [facility] Orthopaedic Surgery 01/11/2021, 5:55 PM

## 2021-01-11 NOTE — Consult Note (Signed)
ORTHOPAEDIC CONSULTATION  PATIENT NAME: [name] DOB: [birth year]   MRN: [id number]  REQUESTING PHYSICIAN: Ivor Costa, MD  Chief Complaint: Right ankle pain  HPI: NOYA Hicks is a 87 y.o. female who slipped and fell while walking to the bathroom earlier this morning, injuring her right ankle.  She denied any loss of consciousness.  She denied any other injuries.  She was unable to stand or bear weight on the right lower extremity due to the pain.  Prior to the injury she would use a cane for ambulation.   Past Medical History:  Diagnosis Date  . Anxiety   . Fluid collection (edema) in the arms, legs, hands and feet   . History of panic attacks   . Hypertension   . Hypothyroidism    Past Surgical History:  Procedure Laterality Date  . ABDOMINAL HYSTERECTOMY    . CATARACT EXTRACTION W/PHACO Left 03/07/2015   Procedure: CATARACT EXTRACTION PHACO AND INTRAOCULAR LENS PLACEMENT (IOC);  Surgeon: Estill Cotta, MD;  Location: [facility] ORS;  Service: Ophthalmology;  Laterality: Left;  [place] 00:57 AP% 24.6 CDE 24.44  . CHOLECYSTECTOMY    . EYE SURGERY     cataract   Social History   Socioeconomic History  . Marital status: Widowed    Spouse name: Not on file  . Number of children: Not on file  . Years of education: Not on file  . Highest education level: Not on file  Occupational History  . Not on file  Tobacco Use  . Smoking status: Former Research scientist (life sciences)  . Smokeless tobacco: Not on file  Substance and Sexual Activity  . Alcohol use: No  . Drug use: Not on file  . Sexual activity: Not on file  Other Topics Concern  . Not on file  Social History Narrative  . Not on file   Social Determinants of Health   Financial Resource Strain: Not on file  Food Insecurity: Not on file  Transportation Needs: Not on file  Physical Activity: Not on file  Stress: Not on file  Social Connections: Not on file   Family History  Problem Relation Age of Onset  . Heart attack Brother     Allergies  Allergen Reactions  . Simvastatin Other (See Comments)    Muscle pain  . Venlafaxine Other (See Comments)    Hallucinations   . Ace Inhibitors Cough  . Amlodipine Besylate     REACTION: Fatigue  . Calcitonin ([place])   . Ciprofloxacin Swelling    Joint swelling.  . Doxazosin Mesylate     REACTION: Urine incont.  . Hydrocod Polst-Cpm Polst Er   . Raloxifene     REACTION: Pain  . Risedronate Sodium     REACTION: GI  . Codeine Anxiety  . Singulair [Montelukast] Anxiety   Prior to Admission medications   Medication Sig Start Date End Date Taking? Authorizing Provider  amLODipine (NORVASC) 2.5 MG tablet Take 2.5 mg by mouth daily. 12/25/20  Yes [provider]  cholecalciferol (VITAMIN D) 400 UNITS TABS tablet Take 1,000 Units by mouth daily.   Yes [provider]  cyanocobalamin (,VITAMIN B-12,) 1000 MCG/ML injection Inject 1,000 mcg into the muscle every 30 (thirty) days. 11/26/20  Yes [provider]  memantine (NAMENDA) 5 MG tablet Take 5 mg by mouth 2 (two) times daily. 08/05/20  Yes [provider]  PARoxetine (PAXIL) 10 MG tablet Take 10 mg by mouth 2 (two) times daily. 01/02/21  Yes [provider]  potassium  chloride (KLOR-CON) 10 MEQ tablet Take 10 mEq by mouth daily. 11/20/20  Yes [provider]  rivastigmine (EXELON) 3 MG capsule Take 3 mg by mouth 2 (two) times daily. 01/02/21  Yes [provider]  sodium chloride 1 g tablet Take 1 tablet (1 g total) by mouth 3 (three) times daily. 09/17/18  Yes Schuyler Amor, MD  SYNTHROID 88 MCG tablet Take 88 mcg by mouth every morning. 01/02/21  Yes [provider]  amLODipine (NORVASC) 5 MG tablet Take 2.5 mg by mouth 2 (two) times daily.    [provider]   DG Ankle Complete Right  Result Date: 01/11/2021 CLINICAL DATA:  Injury.  Swelling and pain. EXAM: RIGHT ANKLE - COMPLETE 3+ VIEW COMPARISON:  No prior. FINDINGS: Comminuted angulated  fracture noted of the distal right fibular shaft. Small avulsion fracture noted along the distal tip of the lateral malleolus. Displaced angulated fracture noted of the medial malleolus. Prominent fracture fragment noted over the lateral aspect of the distal tibial epiphysis. A fracture of the posterior malleolus cannot be completely excluded. Fracture fragment age undetermined noted medial to the talus. Severe disruption of the tibiotalar joint space. IMPRESSION: Multiple complex fractures about the right ankle with severe disruption of the tibiotalar joint space as described above. CT of the right ankle may prove useful for further evaluation. Electronically Signed   By: Marcello Moores  Register   On: 01/11/2021 07:19   CT Head Wo Contrast  Result Date: 01/11/2021 CLINICAL DATA:  Slipped on wet floor, swelling hematoma posteriorly. EXAM: CT HEAD WITHOUT CONTRAST TECHNIQUE: Contiguous axial images were obtained from the base of the skull through the vertex without intravenous contrast. COMPARISON:  CT 04/21/2020, MR 03/02/2019 FINDINGS: Brain: No evidence of acute infarction, hemorrhage, hydrocephalus, extra-axial collection, visible mass lesion or mass effect. Symmetric prominence of the ventricles, cisterns and sulci compatible with parenchymal volume loss. Patchy areas of white matter hypoattenuation are most compatible with chronic microvascular angiopathy. Stable benign dural calcifications. Vascular: Atherosclerotic calcification of the carotid siphons and intradural vertebral arteries. No hyperdense vessel. Skull: High posterior midline parietal scalp swelling and thickening with thin crescentic hematoma scalp hematoma measuring up to 4 mm in maximal thickness. No subjacent calvarial fracture. Mild hyperostosis frontalis interna is a benign, typically incidental finding. Sinuses/Orbits: Redemonstration of a trace right effusion with hyperostotic changes favoring chronicity. Paranasal sinuses and mastoid air  cells are otherwise predominantly clear. Orbital structures are unremarkable aside from prior lens extractions. Other: Bilateral TMJ arthrosis. IMPRESSION: 1. High posterior midline parietal scalp swelling and thickening with thin crescentic hematoma scalp hematoma measuring up to 4 mm in maximal thickness. No subjacent calvarial fracture. 2. No acute intracranial abnormality. 3. Chronic microvascular angiopathy and parenchymal volume loss. 4. Bilateral TMJ arthrosis. Electronically Signed   By: Lovena Le M.D.   On: 01/11/2021 06:57    Positive ROS: All other systems have been reviewed and were otherwise negative with the exception of those mentioned in the HPI and as above.  Physical Exam: General: Well developed, well nourished female seen in no acute distress. HEENT: Atraumatic and normocephalic. Sclera are clear. Extraocular motion is intact. Oropharynx is clear with moist mucosa. Neck: Supple, nontender, good range of motion.  Lungs: Clear to auscultation bilaterally. Cardiovascular: Regular rate and rhythm with normal S1 and S2. No murmurs. No gallops or rubs. Pedal pulses are palpable bilaterally. Homans test is negative bilaterally. No significant pretibial or ankle edema. Abdomen: Soft, nontender, and nondistended. Bowel sounds are present. Skin:  No lesions in the area of chief complaint Neurologic: Awake, alert, and oriented. Sensory function is grossly intact. Motor strength is felt to be 5 over 5 bilaterally. No clonus or tremor. Good motor coordination. Lymphatic: No axillary or cervical lymphadenopathy  MUSCULOSKELETAL: Examination of the right lower extremity demonstrates a short leg splint to be in place.  Polar Care is in place and functioning.  Good capillary refill to the toes.  The patient is able to actively elevate the foot and ankle.  Assessment: Right bimalleolar ankle fracture  Plan: The findings were discussed in detail with the patient and her daughter.   Recommendations were made for open reduction and internal fixation of the right ankle fractures.  They expressed understanding of the risk benefits agree with plans for surgical intervention.  Keyasia Jolliff P. Holley Bouche M.D.

## 2021-01-11 NOTE — Progress Notes (Signed)
Initial Nutrition Assessment  DOCUMENTATION CODES:   Not applicable  INTERVENTION:   -MVI with minerals daily  NUTRITION DIAGNOSIS:   Increased nutrient needs related to post-op healing as evidenced by estimated needs.  GOAL:   Patient will meet greater than or equal to 90% of their needs  MONITOR:   PO intake,Supplement acceptance,Diet advancement,Labs,Weight trends,Skin,I & O's  REASON FOR ASSESSMENT:   Consult Assessment of nutrition requirement/status  ASSESSMENT:   Bethany Hicks is a 87 y.o. female with PMH as noted below who presents with right ankle and head injuries after a mechanical fall from standing height.  Pt admitted with rt ankle fracture s/p fall.   Per MD notes, plan for ORIF tomorrow (01/12/21).   Spoke with pt and daughter at bedside, who reports pt with very good appetite. Pt consumed most of her lunch and a cookie today (noted meal completion 50%). PTA, pt consumes 3 meals per day 9Breakfast: 2 pieces of toast with jelly and 2 boiled eggs; Lunch: meat, starch, and vegetable; Dinner: sandwich). Pt reports that she often snacks on sweets.   Pt denies any weight loss. Reviewed wt hx; pt with no weight loss over the past 3 years.   Discussed importance of good meal intake to promote post-operative healing. Pt very appreicative of visit.   Medications reviewed and include vitamin D3 and vitamin B-12.   Labs reviewed.   NUTRITION - FOCUSED PHYSICAL EXAM:  Flowsheet Row Most Recent Value  Orbital Region No depletion  Upper Arm Region No depletion  Thoracic and Lumbar Region No depletion  Buccal Region No depletion  Temple Region No depletion  Clavicle Bone Region No depletion  Clavicle and Acromion Bone Region No depletion  Scapular Bone Region No depletion  Dorsal Hand No depletion  Patellar Region No depletion  Anterior Thigh Region No depletion  Posterior Calf Region No depletion  Edema (RD Assessment) Mild  Hair Reviewed  Eyes Reviewed   Mouth Reviewed  Skin Reviewed  Nails Reviewed       Diet Order:   Diet Order            Diet Heart Room service appropriate? Yes; Fluid consistency: Thin  Diet effective now                 EDUCATION NEEDS:   Education needs have been addressed  Skin:  Skin Assessment: Reviewed RN Assessment  Last BM:  Unknown  Height:   Ht Readings from Last 1 Encounters:  01/11/21 5\' 5"  (1.651 m)    Weight:   Wt Readings from Last 1 Encounters:  01/11/21 74.4 kg    Ideal Body Weight:  56.8 kg  BMI:  Body mass index is 27.29 kg/m.  Estimated Nutritional Needs:   Kcal:  1650-1850  Protein:  80-95 grams  Fluid:  > 1.6 L    Loistine Chance, RD, LDN, [place] Registered Dietitian II Certified Diabetes Care and Education Specialist Please refer to [facility] for RD and/or RD on-call/weekend/after hours pager

## 2021-01-12 DIAGNOSIS — S82891A Other fracture of right lower leg, initial encounter for closed fracture: Secondary | ICD-10-CM

## 2021-01-12 LAB — BASIC METABOLIC PANEL
Anion gap: 7 (ref 5–15)
BUN: 18 mg/dL (ref 8–23)
CO2: 29 mmol/L (ref 22–32)
Calcium: 8.6 mg/dL — ABNORMAL LOW (ref 8.9–10.3)
Chloride: 103 mmol/L (ref 98–111)
Creatinine, Ser: 0.91 mg/dL (ref 0.44–1.00)
GFR, Estimated: 60 mL/min — ABNORMAL LOW (ref >60)
Glucose, Bld: 108 mg/dL — ABNORMAL HIGH (ref 70–99)
Potassium: 3.3 mmol/L — ABNORMAL LOW (ref 3.5–5.1)
Sodium: 139 mmol/L (ref 135–145)

## 2021-01-12 LAB — CBC
HCT: 33.8 % — ABNORMAL LOW (ref 36.0–46.0)
Hemoglobin: 10.9 g/dL — ABNORMAL LOW (ref 12.0–15.0)
MCH: 29.6 pg (ref 26.0–34.0)
MCHC: 32.2 g/dL (ref 30.0–36.0)
MCV: 91.8 fL (ref 80.0–100.0)
Platelets: 149 10*3/uL — ABNORMAL LOW (ref 150–400)
RBC: 3.68 MIL/uL — ABNORMAL LOW (ref 3.87–5.11)
RDW: 15.1 % (ref 11.5–15.5)
WBC: 6.6 10*3/uL (ref 4.0–10.5)
nRBC: 0 % (ref 0.0–0.2)

## 2021-01-12 MED ORDER — ACETAMINOPHEN 325 MG PO TABS
650.0000 mg | ORAL_TABLET | Freq: Four times a day (QID) | ORAL | Status: DC
  Administered 2021-01-12 – 2021-01-13 (×4): 650 mg via ORAL
  Filled 2021-01-12 (×4): qty 2

## 2021-01-12 MED ORDER — POTASSIUM CHLORIDE CRYS ER 20 MEQ PO TBCR
40.0000 meq | EXTENDED_RELEASE_TABLET | Freq: Once | ORAL | Status: AC
  Administered 2021-01-12: 40 meq via ORAL
  Filled 2021-01-12: qty 2

## 2021-01-12 MED ORDER — CEFAZOLIN SODIUM-DEXTROSE 2-4 GM/100ML-% IV SOLN
2.0000 g | INTRAVENOUS | Status: AC
  Administered 2021-01-13: 2 g via INTRAVENOUS
  Filled 2021-01-12: qty 100

## 2021-01-12 NOTE — Progress Notes (Signed)
PROGRESS NOTE    Bethany Hicks  OVF:[id number] DOB: 1927-11-17 DOA: 01/11/2021 PCP: Rusty Aus, MD   Brief Narrative: 87 y.o. female with medical history significant of hypertension, hyperlipidemia, GERD, hypothyroidism, depression, anxiety, panic attack, tobacco, atrial arrhythmia?, who presents with fall and right ankle pain.  Per her daughter at the bedside, patient fell when she was walking to the bathroom, and slipped on something wet on the floor. No LOC. She injured her right ankle, causing swelling and pain in right ankle. The pain is constant, moderate to severe, sharp, nonradiating. No leg numbness.  She also injured the back of head and developed a hematoma in the back of her head. No neck pain.  No unilateral numbness or tingling in extremities.  No facial droop or slurred speech, denies dizziness or lightheadedness.  Imaging confirmed multiple complex fractures in the right ankle and a disruption of the tibiotalar joint space.  Orthopedics consulted with plans for operative fixation 3/25.    Assessment & Plan:   Principal Problem:   Closed right ankle fracture Active Problems:   Hypothyroidism   HLD (hyperlipidemia)   Essential hypertension   Depression   Fall  Closed right ankle fracture  X-ray showed multiple complex fractures about the right ankle with severe disruption of the tibiotalar joint space as described above.  Patient has moderate pain now.  No neurovascular compromise.  Orthopedic surgeon, Dr. Marry Guan was consulted, Plan for operative fixation 3/25 Plan: Advance diet today, n.p.o. after midnight Multimodal pain control Antiemetics as needed   Hypothyroidism -Synthroid  HLD (hyperlipidemia)  Not taking medications currently -Outpatient follow-up  Essential hypertension -As needed hydralazine IV -Amlodipine  Depression: -Paxil  Fall - PT/OT when able to, likely postop day 1  Memory loss/dementia? -Namenda,  Exelon   Perioperative Cardiac Risk: pt has multiple comorbidities, but no hx of CAD or CHF. No hx of COPD. Currently patient is active and independent of ADLs and, IADLs. She has home health aid coming to help her Monday to Friday. On physical examination, patient has mild soft systolic murmur 1/6 and has trace leg edema, but pt has no recent acute cardiac issues.  Patient does not have chest pain, shortness of breath and palpitation. No signs of acute CHF exacerbation currently.  EKG has no acute change. At this time point, no further work up is needed. Patient's GUPTA score perioperative myocardial infarction or cardaic arrest is 1%.  I discussed the risk with patient and her daughter, pt would like to proceed for surgery.    DVT prophylaxis: SCD Code Status: Full Family Communication:  Daughter at bedside Disposition Plan:Status is: Inpatient  Remains inpatient appropriate because:Inpatient level of care appropriate due to severity of illness   Dispo: The patient is from: Home              Anticipated d/c is to: SNF              Patient currently is not medically stable to d/c.   Difficult to place patient No  Complex ankle fracture.  Operative fixation plan 3/25.     Level of care: Med-Surg  Consultants:   Orthopedics  Procedures:   Ankle fracture repair plan 3/25  Antimicrobials:   None   Subjective: Patient seen and examined.  Improved pain control from admission.  No other issues.  No new complaints  Objective: Vitals:   01/11/21 1527 01/11/21 2135 01/12/21 0411 01/12/21 0834  BP: (!) 162/70 (!) 167/80 (!) 162/57 (!) 153/71  Pulse: 62 68 61 66  Resp: 18 16 16 17   Temp: 98.7 F (37.1 C) 98.2 F (36.8 C) 98.5 F (36.9 C) 98.1 F (36.7 C)  TempSrc:  Oral  Oral  SpO2: 97% 91% 94% 96%  Weight:      Height:        Intake/Output Summary (Last 24 hours) at 01/12/2021 0956 Last data filed at 01/11/2021 1847 Gross per 24 hour  Intake 480 ml  Output -   Net 480 ml   Filed Weights   01/11/21 0628  Weight: 74.4 kg    Examination:  General exam: Appears calm and comfortable  Respiratory system: Clear to auscultation. Respiratory effort normal. Cardiovascular system: S1 & S2 heard, RRR. No JVD, murmurs, rubs, gallops or clicks. No pedal edema. Gastrointestinal system: Abdomen is nondistended, soft and nontender. No organomegaly or masses felt. Normal bowel sounds heard. Central nervous system: Alert and oriented. No focal neurological deficits. Extremities: Right ankle in brace, not taken down.  Good pedal pulses.  Sensation intact Skin: No rashes, lesions or ulcers Psychiatry: Judgement and insight appear normal. Mood & affect appropriate.     Data Reviewed: I have personally reviewed following labs and imaging studies  CBC: Recent Labs  Lab 01/11/21 0813 01/12/21 0544  WBC 7.6 6.6  NEUTROABS 5.7  --   HGB 12.6 10.9*  HCT 38.2 33.8*  MCV 91.4 91.8  PLT 175 128*   Basic Metabolic Panel: Recent Labs  Lab 01/11/21 0813 01/12/21 0544  NA 140 139  K 3.5 3.3*  CL 105 103  CO2 26 29  GLUCOSE 95 108*  BUN 20 18  CREATININE 0.83 0.91  CALCIUM 8.7* 8.6*   GFR: Estimated Creatinine Clearance: 41.5 mL/min (by C-G formula based on SCr of 0.91 mg/dL). Liver Function Tests: No results for input(s): AST, ALT, ALKPHOS, BILITOT, PROT, ALBUMIN in the last 168 hours. No results for input(s): LIPASE, AMYLASE in the last 168 hours. No results for input(s): AMMONIA in the last 168 hours. Coagulation Profile: Recent Labs  Lab 01/11/21 0813  INR 1.0   Cardiac Enzymes: No results for input(s): CKTOTAL, CKMB, CKMBINDEX, TROPONINI in the last 168 hours. BNP (last 3 results) No results for input(s): PROBNP in the last 8760 hours. HbA1C: No results for input(s): HGBA1C in the last 72 hours. CBG: No results for input(s): GLUCAP in the last 168 hours. Lipid Profile: No results for input(s): CHOL, HDL, LDLCALC, TRIG, CHOLHDL,  LDLDIRECT in the last 72 hours. Thyroid Function Tests: No results for input(s): TSH, T4TOTAL, FREET4, T3FREE, THYROIDAB in the last 72 hours. Anemia Panel: No results for input(s): VITAMINB12, FOLATE, FERRITIN, TIBC, IRON, RETICCTPCT in the last 72 hours. Sepsis Labs: No results for input(s): PROCALCITON, LATICACIDVEN in the last 168 hours.  Recent Results (from the past 240 hour(s))  Resp Panel by RT-PCR (Flu A&B, Covid) Nasopharyngeal Swab     Status: None   Collection Time: 01/11/21 10:41 AM   Specimen: Nasopharyngeal Swab; Nasopharyngeal(NP) swabs in vial transport medium  Result Value Ref Range Status   SARS Coronavirus 2 by RT PCR NEGATIVE NEGATIVE Final    Comment: (NOTE) SARS-CoV-2 target nucleic acids are NOT DETECTED.  The SARS-CoV-2 RNA is generally detectable in upper respiratory specimens during the acute phase of infection. The lowest concentration of SARS-CoV-2 viral copies this assay can detect is 138 copies/mL. A negative result does not preclude SARS-Cov-2 infection and should not be used as the sole basis for treatment or other patient management decisions.  A negative result may occur with  improper specimen collection/handling, submission of specimen other than nasopharyngeal swab, presence of viral mutation(s) within the areas targeted by this assay, and inadequate number of viral copies(<138 copies/mL). A negative result must be combined with clinical observations, patient history, and epidemiological information. The expected result is Negative.  Fact Sheet for Patients:  EntrepreneurPulse.com.au  Fact Sheet for Healthcare Providers:  IncredibleEmployment.be  This test is no t yet approved or cleared by the [place] FDA and  has been authorized for detection and/or diagnosis of SARS-CoV-2 by FDA under an Emergency Use Authorization (EUA). This EUA will remain  in effect (meaning this test can be used) for the  duration of the COVID-19 declaration under Section 564(b)(1) of the Act, 21 U.S.C.section 360bbb-3(b)(1), unless the authorization is terminated  or revoked sooner.       Influenza A by PCR NEGATIVE NEGATIVE Final   Influenza B by PCR NEGATIVE NEGATIVE Final    Comment: (NOTE) The Xpert Xpress SARS-CoV-2/FLU/RSV plus assay is intended as an aid in the diagnosis of influenza from Nasopharyngeal swab specimens and should not be used as a sole basis for treatment. Nasal washings and aspirates are unacceptable for Xpert Xpress SARS-CoV-2/FLU/RSV testing.  Fact Sheet for Patients: EntrepreneurPulse.com.au  Fact Sheet for Healthcare Providers: IncredibleEmployment.be  This test is not yet approved or cleared by the [place] FDA and has been authorized for detection and/or diagnosis of SARS-CoV-2 by FDA under an Emergency Use Authorization (EUA). This EUA will remain in effect (meaning this test can be used) for the duration of the COVID-19 declaration under Section 564(b)(1) of the Act, 21 U.S.C. section 360bbb-3(b)(1), unless the authorization is terminated or revoked.  Performed at [facility], [address], [place], [place] [address]          Radiology Studies: DG Ankle Complete Right  Result Date: 01/11/2021 CLINICAL DATA:  Injury.  Swelling and pain. EXAM: RIGHT ANKLE - COMPLETE 3+ VIEW COMPARISON:  No prior. FINDINGS: Comminuted angulated fracture noted of the distal right fibular shaft. Small avulsion fracture noted along the distal tip of the lateral malleolus. Displaced angulated fracture noted of the medial malleolus. Prominent fracture fragment noted over the lateral aspect of the distal tibial epiphysis. A fracture of the posterior malleolus cannot be completely excluded. Fracture fragment age undetermined noted medial to the talus. Severe disruption of the tibiotalar joint space. IMPRESSION: Multiple complex  fractures about the right ankle with severe disruption of the tibiotalar joint space as described above. CT of the right ankle may prove useful for further evaluation. Electronically Signed   By: Marcello Moores  Register   On: 01/11/2021 07:19   CT Head Wo Contrast  Result Date: 01/11/2021 CLINICAL DATA:  Slipped on wet floor, swelling hematoma posteriorly. EXAM: CT HEAD WITHOUT CONTRAST TECHNIQUE: Contiguous axial images were obtained from the base of the skull through the vertex without intravenous contrast. COMPARISON:  CT 04/21/2020, MR 03/02/2019 FINDINGS: Brain: No evidence of acute infarction, hemorrhage, hydrocephalus, extra-axial collection, visible mass lesion or mass effect. Symmetric prominence of the ventricles, cisterns and sulci compatible with parenchymal volume loss. Patchy areas of white matter hypoattenuation are most compatible with chronic microvascular angiopathy. Stable benign dural calcifications. Vascular: Atherosclerotic calcification of the carotid siphons and intradural vertebral arteries. No hyperdense vessel. Skull: High posterior midline parietal scalp swelling and thickening with thin crescentic hematoma scalp hematoma measuring up to 4 mm in maximal thickness. No subjacent calvarial fracture. Mild hyperostosis frontalis interna is a  benign, typically incidental finding. Sinuses/Orbits: Redemonstration of a trace right effusion with hyperostotic changes favoring chronicity. Paranasal sinuses and mastoid air cells are otherwise predominantly clear. Orbital structures are unremarkable aside from prior lens extractions. Other: Bilateral TMJ arthrosis. IMPRESSION: 1. High posterior midline parietal scalp swelling and thickening with thin crescentic hematoma scalp hematoma measuring up to 4 mm in maximal thickness. No subjacent calvarial fracture. 2. No acute intracranial abnormality. 3. Chronic microvascular angiopathy and parenchymal volume loss. 4. Bilateral TMJ arthrosis. Electronically  Signed   By: Lovena Le M.D.   On: 01/11/2021 06:57        Scheduled Meds: . acetaminophen  650 mg Oral Q6H  . amLODipine  2.5 mg Oral Daily  . cholecalciferol  1,000 Units Oral Daily  . cyanocobalamin  1,000 mcg Intramuscular Q30 days  . levothyroxine  88 mcg Oral q morning  . memantine  5 mg Oral BID  . multivitamin with minerals  1 tablet Oral Daily  . PARoxetine  10 mg Oral BID  . rivastigmine  3 mg Oral BID  . sodium chloride  1 g Oral TID   Continuous Infusions:   LOS: 1 day    Time spent: 25 minutes    Sidney Ace, MD [facility] Pager [phone]  If 7PM-7AM, please contact night-coverage 01/12/2021, 9:56 AM

## 2021-01-12 NOTE — Anesthesia Preprocedure Evaluation (Addendum)
Anesthesia Evaluation  Patient identified by MRN, date of birth, ID band Patient awake    Reviewed: Allergy & Precautions, NPO status , Patient's Chart, lab work & pertinent test results  History of Anesthesia Complications Negative for: history of anesthetic complications  Airway Mallampati: II  TM Distance: >3 FB Neck ROM: Full    Dental no notable dental hx.    Pulmonary neg sleep apnea, neg COPD, former smoker,    breath sounds clear to auscultation- rhonchi (-) wheezing      Cardiovascular hypertension, Pt. on medications (-) CAD, (-) Past MI, (-) Cardiac Stents and (-) CABG  Rhythm:Regular Rate:Normal - Systolic murmurs and - Diastolic murmurs    Neuro/Psych neg Seizures PSYCHIATRIC DISORDERS Anxiety Depression negative neurological ROS     GI/Hepatic Neg liver ROS, GERD  ,  Endo/Other  neg diabetesHypothyroidism   Renal/GU negative Renal ROS     Musculoskeletal negative musculoskeletal ROS (+)   Abdominal (+) - obese,   Peds  Hematology negative hematology ROS (+)   Anesthesia Other Findings Past Medical History: No date: Anxiety No date: Fluid collection (edema) in the arms, legs, hands and feet No date: History of panic attacks No date: Hypertension No date: Hypothyroidism   Reproductive/Obstetrics                            Anesthesia Physical Anesthesia Plan  ASA: II  Anesthesia Plan: General   Post-op Pain Management:    Induction: Intravenous  PONV Risk Score and Plan: 2 and Ondansetron and Dexamethasone  Airway Management Planned: Oral ETT  Additional Equipment:   Intra-op Plan:   Post-operative Plan: Extubation in OR  Informed Consent: I have reviewed the patients History and Physical, chart, labs and discussed the procedure including the risks, benefits and alternatives for the proposed anesthesia with the patient or authorized representative who has  indicated his/her understanding and acceptance.     Dental advisory given  Plan Discussed with: CRNA and Anesthesiologist  Anesthesia Plan Comments:         Anesthesia Quick Evaluation

## 2021-01-12 NOTE — Progress Notes (Signed)
  Subjective:   Procedure(s) (LRB): OPEN REDUCTION INTERNAL FIXATION (ORIF) ANKLE FRACTURE-Bimalleolar (Right)  Daughter at bedside. Patient reports pain as well-controlled.   Patient is well, and has had no acute complaints or problems Negative for chest pain and shortness of breath Fever: no Gastrointestinal: negative for nausea and vomiting.    Objective: Vital signs in last 24 hours: Temp:  [98.2 F (36.8 C)-98.8 F (37.1 C)] 98.5 F (36.9 C) (03/24 0411) Pulse Rate:  [54-72] 61 (03/24 0411) Resp:  [15-22] 16 (03/24 0411) BP: (140-167)/(57-104) 162/57 (03/24 0411) SpO2:  [90 %-98 %] 94 % (03/24 0411)  Intake/Output from previous day:  Intake/Output Summary (Last 24 hours) at 01/12/2021 0744 Last data filed at 01/11/2021 1847 Gross per 24 hour  Intake 480 ml  Output --  Net 480 ml    Intake/Output this shift: No intake/output data recorded.  Labs: Recent Labs    01/11/21 0813 01/12/21 0544  HGB 12.6 10.9*   Recent Labs    01/11/21 0813 01/12/21 0544  WBC 7.6 6.6  RBC 4.18 3.68*  HCT 38.2 33.8*  PLT 175 149*   Recent Labs    01/11/21 0813 01/12/21 0544  NA 140 139  K 3.5 3.3*  CL 105 103  CO2 26 29  BUN 20 18  CREATININE 0.83 0.91  GLUCOSE 95 108*  CALCIUM 8.7* 8.6*   Recent Labs    01/11/21 0813  INR 1.0     EXAM General - Patient is Alert, Appropriate and Oriented Extremity - Neurovascular intact cap refill <2 seconds Dressing/Incision -splint remains in place  Motor Function - intact, moving foot and toes well on exam.  Cardiovascular- Regular rate and rhythm, no murmurs/rubs/gallops Respiratory- Lungs clear to auscultation bilaterally Gastrointestinal- soft, nontender and active bowel sounds   Assessment/Plan:   Procedure(s) (LRB): OPEN REDUCTION INTERNAL FIXATION (ORIF) ANKLE FRACTURE-Bimalleolar (Right) Principal Problem:   Closed right ankle fracture Active Problems:   Hypothyroidism   HLD (hyperlipidemia)   Essential  hypertension   Depression   Fall  Estimated body mass index is 27.29 kg/m as calculated from the following:   Height as of this encounter: 5\' 5"  (1.651 m).   Weight as of this encounter: 74.4 kg. Advance diet  Plan is for ORIF by Dr. Marry Guan tomorrow. Patient will be NPO after midnight tonight.   Non Weight-Bearing to right leg  Cassell Smiles, PA-C [facility] 01/12/2021, 7:44 AM

## 2021-01-12 NOTE — Plan of Care (Signed)
  Problem: Education: Goal: Knowledge of General Education information will improve Description Including pain rating scale, medication(s)/side effects and non-pharmacologic comfort measures Outcome: Progressing   Problem: Clinical Measurements: Goal: Ability to maintain clinical measurements within normal limits will improve Outcome: Progressing   Problem: Clinical Measurements: Goal: Will remain free from infection Outcome: Progressing   

## 2021-01-12 NOTE — Plan of Care (Signed)

## 2021-01-13 ENCOUNTER — Inpatient Hospital Stay: Payer: PPO | Admitting: Anesthesiology

## 2021-01-13 ENCOUNTER — Encounter
Admission: EM | Disposition: A | Discharge status: Skilled Nursing Facility | Payer: Self-pay | Source: Home / Self Care | Attending: Internal Medicine

## 2021-01-13 ENCOUNTER — Encounter: Payer: Self-pay | Admitting: Internal Medicine

## 2021-01-13 ENCOUNTER — Inpatient Hospital Stay: Payer: PPO

## 2021-01-13 HISTORY — PX: ORIF ANKLE FRACTURE: SHX5408

## 2021-01-13 LAB — ABO/RH: ABO/RH(D): O POS

## 2021-01-13 SURGERY — OPEN REDUCTION INTERNAL FIXATION (ORIF) ANKLE FRACTURE
Anesthesia: General | Site: Ankle | Laterality: Right

## 2021-01-13 MED ORDER — PANTOPRAZOLE SODIUM 40 MG PO TBEC
40.0000 mg | DELAYED_RELEASE_TABLET | Freq: Every day | ORAL | Status: DC
  Administered 2021-01-14 – 2021-01-17 (×4): 40 mg via ORAL
  Filled 2021-01-13 (×4): qty 1

## 2021-01-13 MED ORDER — NEOMYCIN-POLYMYXIN B GU 40-200000 IR SOLN
Status: DC | PRN
  Administered 2021-01-13: 2 mL

## 2021-01-13 MED ORDER — TRAMADOL HCL 50 MG PO TABS
50.0000 mg | ORAL_TABLET | ORAL | Status: DC | PRN
  Administered 2021-01-15 (×2): 100 mg via ORAL
  Filled 2021-01-13 (×2): qty 2

## 2021-01-13 MED ORDER — FENTANYL CITRATE (PF) 100 MCG/2ML IJ SOLN
INTRAMUSCULAR | Status: AC
  Filled 2021-01-13: qty 2

## 2021-01-13 MED ORDER — ENOXAPARIN SODIUM 40 MG/0.4ML ~~LOC~~ SOLN
40.0000 mg | SUBCUTANEOUS | Status: DC
  Administered 2021-01-14 – 2021-01-17 (×4): 40 mg via SUBCUTANEOUS
  Filled 2021-01-13 (×4): qty 0.4

## 2021-01-13 MED ORDER — SUGAMMADEX SODIUM 200 MG/2ML IV SOLN
INTRAVENOUS | Status: DC | PRN
  Administered 2021-01-13: 200 mg via INTRAVENOUS

## 2021-01-13 MED ORDER — PHENOL 1.4 % MT LIQD
1.0000 | OROMUCOSAL | Status: DC | PRN
  Filled 2021-01-13: qty 177

## 2021-01-13 MED ORDER — OXYCODONE HCL 5 MG PO TABS
5.0000 mg | ORAL_TABLET | ORAL | Status: DC | PRN
Start: 2021-01-13 — End: 2021-01-17
  Administered 2021-01-15: 5 mg via ORAL
  Filled 2021-01-13: qty 1

## 2021-01-13 MED ORDER — DEXAMETHASONE SODIUM PHOSPHATE 10 MG/ML IJ SOLN
INTRAMUSCULAR | Status: DC | PRN
  Administered 2021-01-13: 10 mg via INTRAVENOUS

## 2021-01-13 MED ORDER — METOCLOPRAMIDE HCL 10 MG PO TABS: 5.0000 mg | ORAL_TABLET | Freq: Three times a day (TID) | ORAL | Status: DC | PRN

## 2021-01-13 MED ORDER — ONDANSETRON HCL 4 MG/2ML IJ SOLN
INTRAMUSCULAR | Status: DC | PRN
  Administered 2021-01-13 (×2): 4 mg via INTRAVENOUS

## 2021-01-13 MED ORDER — SUCCINYLCHOLINE CHLORIDE 200 MG/10ML IV SOSY
PREFILLED_SYRINGE | INTRAVENOUS | Status: AC
  Filled 2021-01-13: qty 10

## 2021-01-13 MED ORDER — LIDOCAINE HCL (CARDIAC) PF 100 MG/5ML IV SOSY
PREFILLED_SYRINGE | INTRAVENOUS | Status: DC | PRN
  Administered 2021-01-13: 80 mg via INTRAVENOUS

## 2021-01-13 MED ORDER — ACETAMINOPHEN 10 MG/ML IV SOLN
INTRAVENOUS | Status: DC | PRN
  Administered 2021-01-13: 1000 mg via INTRAVENOUS

## 2021-01-13 MED ORDER — ACETAMINOPHEN 10 MG/ML IV SOLN
1000.0000 mg | Freq: Four times a day (QID) | INTRAVENOUS | Status: AC
  Administered 2021-01-13 – 2021-01-14 (×4): 1000 mg via INTRAVENOUS
  Filled 2021-01-13 (×4): qty 100

## 2021-01-13 MED ORDER — BISACODYL 10 MG RE SUPP: 10.0000 mg | Freq: Every day | RECTAL | Status: DC | PRN

## 2021-01-13 MED ORDER — ROCURONIUM BROMIDE 100 MG/10ML IV SOLN
INTRAVENOUS | Status: DC | PRN
  Administered 2021-01-13: 30 mg via INTRAVENOUS
  Administered 2021-01-13: 50 mg via INTRAVENOUS

## 2021-01-13 MED ORDER — FLEET ENEMA 7-19 GM/118ML RE ENEM: 1.0000 | ENEMA | Freq: Once | RECTAL | Status: DC | PRN

## 2021-01-13 MED ORDER — SENNOSIDES-DOCUSATE SODIUM 8.6-50 MG PO TABS
1.0000 | ORAL_TABLET | Freq: Two times a day (BID) | ORAL | Status: DC
  Administered 2021-01-13 – 2021-01-16 (×7): 1 via ORAL
  Filled 2021-01-13 (×8): qty 1

## 2021-01-13 MED ORDER — METOCLOPRAMIDE HCL 5 MG/ML IJ SOLN
5.0000 mg | Freq: Three times a day (TID) | INTRAMUSCULAR | Status: DC | PRN
Start: 2021-01-13 — End: 2021-01-17

## 2021-01-13 MED ORDER — SODIUM CHLORIDE 0.9 % IV SOLN
INTRAVENOUS | Status: DC | PRN
  Administered 2021-01-13: 30 ug/min via INTRAVENOUS

## 2021-01-13 MED ORDER — PHENYLEPHRINE HCL (PRESSORS) 10 MG/ML IV SOLN
INTRAVENOUS | Status: DC | PRN
  Administered 2021-01-13 (×2): 100 ug via INTRAVENOUS

## 2021-01-13 MED ORDER — ACETAMINOPHEN 325 MG PO TABS
325.0000 mg | ORAL_TABLET | Freq: Four times a day (QID) | ORAL | Status: DC | PRN
  Administered 2021-01-14 – 2021-01-15 (×2): 650 mg via ORAL
  Filled 2021-01-13 (×2): qty 2

## 2021-01-13 MED ORDER — ACETAMINOPHEN 10 MG/ML IV SOLN
INTRAVENOUS | Status: AC
  Filled 2021-01-13: qty 100

## 2021-01-13 MED ORDER — FERROUS SULFATE 325 (65 FE) MG PO TABS
325.0000 mg | ORAL_TABLET | Freq: Two times a day (BID) | ORAL | Status: DC
  Administered 2021-01-14 – 2021-01-17 (×7): 325 mg via ORAL
  Filled 2021-01-13 (×7): qty 1

## 2021-01-13 MED ORDER — SODIUM CHLORIDE 0.9 % IV SOLN: INTRAVENOUS | Status: DC

## 2021-01-13 MED ORDER — ONDANSETRON HCL 4 MG PO TABS: 4.0000 mg | ORAL_TABLET | Freq: Four times a day (QID) | ORAL | Status: DC | PRN

## 2021-01-13 MED ORDER — MAGNESIUM HYDROXIDE 400 MG/5ML PO SUSP: 30.0000 mL | Freq: Every day | ORAL | Status: DC | PRN

## 2021-01-13 MED ORDER — MENTHOL 3 MG MT LOZG
1.0000 | LOZENGE | OROMUCOSAL | Status: DC | PRN
Start: 2021-01-13 — End: 2021-01-17
  Filled 2021-01-13: qty 9

## 2021-01-13 MED ORDER — ONDANSETRON HCL 4 MG/2ML IJ SOLN: 4.0000 mg | Freq: Four times a day (QID) | INTRAMUSCULAR | Status: DC | PRN

## 2021-01-13 MED ORDER — BUPIVACAINE HCL 0.25 % IJ SOLN
INTRAMUSCULAR | Status: DC | PRN
  Administered 2021-01-13: 10 mL

## 2021-01-13 MED ORDER — PROMETHAZINE HCL 25 MG/ML IJ SOLN: 6.2500 mg | INTRAMUSCULAR | Status: DC | PRN

## 2021-01-13 MED ORDER — LIDOCAINE HCL (PF) 2 % IJ SOLN
INTRAMUSCULAR | Status: AC
  Filled 2021-01-13: qty 5

## 2021-01-13 MED ORDER — PROPOFOL 10 MG/ML IV BOLUS
INTRAVENOUS | Status: DC | PRN
  Administered 2021-01-13: 70 mg via INTRAVENOUS

## 2021-01-13 MED ORDER — FENTANYL CITRATE (PF) 100 MCG/2ML IJ SOLN
INTRAMUSCULAR | Status: DC | PRN
  Administered 2021-01-13 (×2): 25 ug via INTRAVENOUS
  Administered 2021-01-13: 50 ug via INTRAVENOUS
  Administered 2021-01-13 (×4): 25 ug via INTRAVENOUS

## 2021-01-13 MED ORDER — HYDROMORPHONE HCL 1 MG/ML IJ SOLN: 0.5000 mg | INTRAMUSCULAR | Status: DC | PRN

## 2021-01-13 MED ORDER — CHLORHEXIDINE GLUCONATE CLOTH 2 % EX PADS
6.0000 | MEDICATED_PAD | Freq: Every day | CUTANEOUS | Status: DC
  Administered 2021-01-14: 6 via TOPICAL

## 2021-01-13 MED ORDER — CEFAZOLIN SODIUM-DEXTROSE 2-4 GM/100ML-% IV SOLN
2.0000 g | Freq: Four times a day (QID) | INTRAVENOUS | Status: AC
  Administered 2021-01-13 – 2021-01-14 (×4): 2 g via INTRAVENOUS
  Filled 2021-01-13 (×5): qty 100

## 2021-01-13 MED ORDER — FENTANYL CITRATE (PF) 100 MCG/2ML IJ SOLN: 25.0000 ug | INTRAMUSCULAR | Status: DC | PRN

## 2021-01-13 MED ORDER — OXYCODONE HCL 5 MG PO TABS: 10.0000 mg | ORAL_TABLET | ORAL | Status: DC | PRN

## 2021-01-13 MED ORDER — PROPOFOL 500 MG/50ML IV EMUL
INTRAVENOUS | Status: AC
  Filled 2021-01-13: qty 50

## 2021-01-13 MED ORDER — MEPERIDINE HCL 50 MG/ML IJ SOLN: 6.2500 mg | INTRAMUSCULAR | Status: DC | PRN

## 2021-01-13 SURGICAL SUPPLY — 53 items
BIT DRILL 2.5X110 QC LCP DISP (BIT) ×1 IMPLANT
BIT DRILL CANN 2.7X625 NONSTRL (BIT) ×1 IMPLANT
BNDG COHESIVE 4X5 TAN STRL (GAUZE/BANDAGES/DRESSINGS) ×2 IMPLANT
BNDG ELASTIC 4X5.8 VLCR STR LF (GAUZE/BANDAGES/DRESSINGS) ×2 IMPLANT
BNDG ESMARK 6X12 TAN STRL LF (GAUZE/BANDAGES/DRESSINGS) ×2 IMPLANT
COOLER POLAR GLACIER W/PUMP (MISCELLANEOUS) ×2 IMPLANT
COVER WAND RF STERILE (DRAPES) ×2 IMPLANT
CUFF TOURN SGL QUICK 24 (TOURNIQUET CUFF)
CUFF TOURN SGL QUICK 30 (TOURNIQUET CUFF)
CUFF TRNQT CYL 24X4X16.5-23 (TOURNIQUET CUFF) IMPLANT
CUFF TRNQT CYL 30X4X21-28X (TOURNIQUET CUFF) IMPLANT
DRAPE FLUOR MINI C-ARM 54X84 (DRAPES) ×2 IMPLANT
DRSG DERMACEA 8X12 NADH (GAUZE/BANDAGES/DRESSINGS) ×2 IMPLANT
DURAPREP 26ML APPLICATOR (WOUND CARE) ×2 IMPLANT
ELECT CAUTERY BLADE 6.4 (BLADE) ×2 IMPLANT
ELECT REM PT RETURN 9FT ADLT (ELECTROSURGICAL) ×2
ELECTRODE REM PT RTRN 9FT ADLT (ELECTROSURGICAL) ×1 IMPLANT
GAUZE SPONGE 4X4 12PLY STRL (GAUZE/BANDAGES/DRESSINGS) ×2 IMPLANT
GLOVE SURG ENC TEXT LTX SZ7.5 (GLOVE) ×2 IMPLANT
GLOVE SURG UNDER LTX SZ8 (GLOVE) ×2 IMPLANT
GOWN STRL REUS W/ TWL LRG LVL3 (GOWN DISPOSABLE) ×2 IMPLANT
GOWN STRL REUS W/TWL LRG LVL3 (GOWN DISPOSABLE) ×4
GUIDEWARE NON THREAD 1.25X150 (WIRE) ×4
GUIDEWIRE NON THREAD 1.25X150 (WIRE) IMPLANT
KIT 1/3 TUB PL 8H 97M (Orthopedic Implant) IMPLANT
KIT TURNOVER KIT A (KITS) ×2 IMPLANT
LABEL OR SOLS (LABEL) ×2 IMPLANT
MANIFOLD NEPTUNE II (INSTRUMENTS) ×2 IMPLANT
PACK EXTREMITY ARMC (MISCELLANEOUS) ×2 IMPLANT
PAD ABD DERMACEA PRESS 5X9 (GAUZE/BANDAGES/DRESSINGS) ×6 IMPLANT
PAD CAST CTTN 4X4 STRL (SOFTGOODS) ×3 IMPLANT
PAD PREP 24X41 OB/GYN DISP (PERSONAL CARE ITEMS) ×2 IMPLANT
PAD WRAPON POLAR ANKLE (MISCELLANEOUS) ×1 IMPLANT
PADDING CAST COTTON 4X4 STRL (SOFTGOODS) ×6
PROS 1/3 TUB PL 8H 97M (Orthopedic Implant) ×2 IMPLANT
SCREW CORTEX 3.5 12MM (Screw) ×1 IMPLANT
SCREW CORTEX 3.5 14MM (Screw) ×3 IMPLANT
SCREW CORTEX 3.5 16MM (Screw) ×1 IMPLANT
SCREW CORTEX 3.5 18MM (Screw) ×1 IMPLANT
SCREW LOCK CORT ST 3.5X12 (Screw) IMPLANT
SCREW LOCK CORT ST 3.5X14 (Screw) IMPLANT
SCREW LOCK CORT ST 3.5X16 (Screw) IMPLANT
SCREW LOCK CORT ST 3.5X18 (Screw) IMPLANT
SCREW SHORT THREAD 4.0X46 (Screw) ×1 IMPLANT
SCREW SHORT THREAD 4.0X48 (Screw) ×1 IMPLANT
SPLINT CAST 1 STEP 5X30 WHT (MISCELLANEOUS) ×2 IMPLANT
SPONGE LAP 18X18 RF (DISPOSABLE) ×2 IMPLANT
STAPLER SKIN PROX 35W (STAPLE) ×2 IMPLANT
STOCKINETTE STRL 6IN 960660 (GAUZE/BANDAGES/DRESSINGS) ×2 IMPLANT
SUT VIC AB 0 CT1 36 (SUTURE) ×4 IMPLANT
SUT VIC AB 2-0 SH 27 (SUTURE) ×2
SUT VIC AB 2-0 SH 27XBRD (SUTURE) ×1 IMPLANT
WRAPON POLAR PAD ANKLE (MISCELLANEOUS) ×2

## 2021-01-13 NOTE — Op Note (Signed)
OPERATIVE NOTE  DATE OF SURGERY:  01/13/2021  PATIENT NAME:  Bethany Hicks   DOB: May 08, 1928  MRN: [id number]  PRE-OPERATIVE DIAGNOSIS: Right bimalleolar ankle fracture with disruption of the syndesmosis  POST-OPERATIVE DIAGNOSIS:  Same  PROCEDURE:  Open reduction and internal fixation of the right bimalleolar ankle fracture, placement of syndesmosis screw  SURGEON:  Marciano Sequin. M.D.  ANESTHESIA: general  ESTIMATED BLOOD LOSS: Minimal  FLUIDS REPLACED: 1500 mL of crystalloid  TOURNIQUET TIME: 120 minutes  DRAINS: None  IMPLANTS UTILIZED: Synthes 8 hole 1/3 tubular plate, 6 - 3.5 mm cortical screws,1 -3.5 x 50 mm cortical screw (syndesmosis), 2 - 4.0 mm cannulated partially threaded cancellous screws  INDICATIONS FOR SURGERY: Bethany Hicks is a 87 y.o. year old female who sustained a right bimalleolar ankle fracture. After discussion of the risks and benefits of surgical intervention, the patient expressed understanding of the risks benefits and agree with plans for open reduction and internal fixation.   PROCEDURE IN DETAIL: The patient was brought into the operating room and after adequate general anesthesia, a tourniquet was placed on the patient's right thigh.The right foot and ankle were prepped with alcohol and Duraprep and draped in the usual sterile fashion. A "time-out" was performed as per usual protocol. The foot and ankle were exsanguinated using an Esmarch and the tourniquet was inflated to 300 mmHg. A lateral longitudinal incision was made in line with the distal fibula. Dissection was carried down to the fracture site and the site was debrided of hematoma and soft tissue. A provisional reduction of the fibular fracture was performed and position visualized using the FluoroScan. An 8 hole one third tubular plate was contoured to fit along the lateral aspect of the fibula. The plate was then secured using a combination of six 3.5 mm cortical screws. Good reduction and  hardware placement was noted using the FluoroScan.  A Cotton test was performed and there was widening of the ankle mortise.  It was thus elected to place a syndesmosis screw.  The ankle was slightly dorsiflexed and the syndesmosis was held in reduction.  A 3.5 mm x 50 mm cortical screw was placed in a 30 degree orientation from the coronal plane in a posterior to anterior direction.  The lateral incision was then closed in layers using first #0 Vicryl followed by #2-0 Vicryl.  Next, attention was directed to the medial malleolus. A medial incision was made and dissection was carried down to the distal aspect of the medial malleolus. Provisional reduction was performed and visualized using the FluoroScan. The reduction was maintained using 2 distally threaded guidewires passed in a retrograde fashion and in parallel position. Good position was noted. Measurements were obtained and two 4.0 mm cannulated partially threaded cancellous screws were advanced over the guidewires. Good position was noted. Guidewires were removed and the wound was irrigated with copious amounts of saline with antibiotic solution and then closed using combination of #0 Vicryl followed #2-0 Vicryl. Good reduction with restoration of the ankle mortise and good position of hardware was noted in multiple planes using FluoroScan. Skin edges were then reapproximated using skin staples. Tourniquet was deflated after total tourniquet time of 120 minutes. A sterile dressing was applied followed by application of a posterior splint.   The patient tolerated the procedure well and was transported to the PACU in stable condition.  Asiah Browder P. Holley Bouche., M.D.

## 2021-01-13 NOTE — Care Management Important Message (Signed)
Important Message  Patient Details  Name: Bethany Hicks MRN: [id number] Date of Birth: 1928/09/07   Medicare Important Message Given:  Yes     [name] A Emma Schupp 01/13/2021, 9:50 AM

## 2021-01-13 NOTE — Anesthesia Postprocedure Evaluation (Signed)
Anesthesia Post Note  Patient: Bethany Hicks  Procedure(s) Performed: OPEN REDUCTION INTERNAL FIXATION (ORIF) ANKLE FRACTURE-Bimalleolar (Right Ankle)  Patient location during evaluation: PACU Anesthesia Type: General Level of consciousness: awake and alert Pain management: pain level controlled Vital Signs Assessment: post-procedure vital signs reviewed and stable Respiratory status: spontaneous breathing, nonlabored ventilation and respiratory function stable Cardiovascular status: blood pressure returned to baseline and stable Postop Assessment: no apparent nausea or vomiting Anesthetic complications: no   No complications documented.   Last Vitals:  Vitals:   01/13/21 1945 01/13/21 2143  BP: (!) 159/77 (!) 156/77  Pulse: 79 72  Resp: 17 16  Temp: 36.8 C 36.5 C  SpO2: 95% 96%    Last Pain:  Vitals:   01/13/21 2143  TempSrc:   PainSc: 0-No pain                 Brett Canales Donjuan Robison

## 2021-01-13 NOTE — Progress Notes (Signed)
PROGRESS NOTE    Bethany Hicks  UEA:[id number] DOB: [birth year] DOA: 01/11/2021 PCP: Rusty Aus, MD   Brief Narrative: 87 y.o. female with medical history significant of hypertension, hyperlipidemia, GERD, hypothyroidism, depression, anxiety, panic attack, tobacco, atrial arrhythmia?, who presents with fall and right ankle pain.  Per her daughter at the bedside, patient fell when she was walking to the bathroom, and slipped on something wet on the floor. No LOC. She injured her right ankle, causing swelling and pain in right ankle. The pain is constant, moderate to severe, sharp, nonradiating. No leg numbness.  She also injured the back of head and developed a hematoma in the back of her head. No neck pain.  No unilateral numbness or tingling in extremities.  No facial droop or slurred speech, denies dizziness or lightheadedness.  Imaging confirmed multiple complex fractures in the right ankle and a disruption of the tibiotalar joint space.  Orthopedics consulted with plans for operative fixation 3/25.  Blood pressure markedly elevated on admission, has improved after Multimodal pain control initiated    Assessment & Plan:   Principal Problem:   Closed right ankle fracture Active Problems:   Hypothyroidism   HLD (hyperlipidemia)   Essential hypertension   Depression   Fall  Closed right ankle fracture  X-ray showed multiple complex fractures about the right ankle with severe disruption of the tibiotalar joint space as described above.  Patient has moderate pain now.  No neurovascular compromise.  Orthopedic surgeon, Dr. Marry Guan was consulted, Plan for operative fixation 3/25 Plan: N.p.o. for OR today Multimodal pain control Chemoprophylaxis postop day 1 PT and OT postop day 1   Hypothyroidism -Synthroid  HLD (hyperlipidemia)  Not taking medications currently -Outpatient follow-up  Essential hypertension -As needed hydralazine  IV -Amlodipine  Depression: -Paxil  Fall - PT/OT when able to, likely postop day 1  Memory loss/dementia? -Namenda, Exelon   Perioperative Cardiac Risk: pt has multiple comorbidities, but no hx of CAD or CHF. No hx of COPD. Currently patient is active and independent of ADLs and, IADLs. She has home health aid coming to help her Monday to Friday. On physical examination, patient has mild soft systolic murmur 1/6 and has trace leg edema, but pt has no recent acute cardiac issues.  Patient does not have chest pain, shortness of breath and palpitation. No signs of acute CHF exacerbation currently.  EKG has no acute change. At this time point, no further work up is needed. Patient's GUPTA score perioperative myocardial infarction or cardaic arrest is 1%.  I discussed the risk with patient and her daughter, pt would like to proceed for surgery.    DVT prophylaxis: SCD Code Status: Full Family Communication:  Daughter at bedside 3/25 Disposition Plan:Status is: Inpatient  Remains inpatient appropriate because:Inpatient level of care appropriate due to severity of illness   Dispo: The patient is from: Home              Anticipated d/c is to: SNF              Patient currently is not medically stable to d/c.   Difficult to place patient No  Complex ankle fracture.  Operative fixation plan 3/25.      Level of care: Med-Surg  Consultants:   Orthopedics  Procedures:   Ankle fracture repair plan 3/25  Antimicrobials:   None   Subjective: Patient seen and examined.  Improved pain control from admission.  Improved blood pressure control.  Objective: Vitals:  01/12/21 1559 01/12/21 2004 01/13/21 0313 01/13/21 0732  BP: (!) 176/73 (!) 159/74 (!) 163/68 (!) 130/95  Pulse: 65 66 66 66  Resp: 18 18 18 16   Temp: 99 F (37.2 C) 98.6 F (37 C) 98.2 F (36.8 C) 97.7 F (36.5 C)  TempSrc:    Oral  SpO2: 96% 94% 96% 94%  Weight:      Height:        Intake/Output  Summary (Last 24 hours) at 01/13/2021 1004 Last data filed at 01/13/2021 0200 Gross per 24 hour  Intake 720 ml  Output 1252 ml  Net -532 ml   Filed Weights   01/11/21 0628  Weight: 74.4 kg    Examination:  General exam: Appears calm and comfortable  Respiratory system: Clear to auscultation. Respiratory effort normal. Cardiovascular system: S1 & S2 heard, RRR. No JVD, murmurs, rubs, gallops or clicks. No pedal edema. Gastrointestinal system: Abdomen is nondistended, soft and nontender. No organomegaly or masses felt. Normal bowel sounds heard. Central nervous system: Alert and oriented. No focal neurological deficits. Extremities: Right ankle in brace, not taken down.  Good pedal pulses.  Sensation intact Skin: No rashes, lesions or ulcers Psychiatry: Judgement and insight appear normal. Mood & affect appropriate.     Data Reviewed: I have personally reviewed following labs and imaging studies  CBC: Recent Labs  Lab 01/11/21 0813 01/12/21 0544  WBC 7.6 6.6  NEUTROABS 5.7  --   HGB 12.6 10.9*  HCT 38.2 33.8*  MCV 91.4 91.8  PLT 175 638*   Basic Metabolic Panel: Recent Labs  Lab 01/11/21 0813 01/12/21 0544  NA 140 139  K 3.5 3.3*  CL 105 103  CO2 26 29  GLUCOSE 95 108*  BUN 20 18  CREATININE 0.83 0.91  CALCIUM 8.7* 8.6*   GFR: Estimated Creatinine Clearance: 41.5 mL/min (by C-G formula based on SCr of 0.91 mg/dL). Liver Function Tests: No results for input(s): AST, ALT, ALKPHOS, BILITOT, PROT, ALBUMIN in the last 168 hours. No results for input(s): LIPASE, AMYLASE in the last 168 hours. No results for input(s): AMMONIA in the last 168 hours. Coagulation Profile: Recent Labs  Lab 01/11/21 0813  INR 1.0   Cardiac Enzymes: No results for input(s): CKTOTAL, CKMB, CKMBINDEX, TROPONINI in the last 168 hours. BNP (last 3 results) No results for input(s): PROBNP in the last 8760 hours. HbA1C: No results for input(s): HGBA1C in the last 72 hours. CBG: No  results for input(s): GLUCAP in the last 168 hours. Lipid Profile: No results for input(s): CHOL, HDL, LDLCALC, TRIG, CHOLHDL, LDLDIRECT in the last 72 hours. Thyroid Function Tests: No results for input(s): TSH, T4TOTAL, FREET4, T3FREE, THYROIDAB in the last 72 hours. Anemia Panel: No results for input(s): VITAMINB12, FOLATE, FERRITIN, TIBC, IRON, RETICCTPCT in the last 72 hours. Sepsis Labs: No results for input(s): PROCALCITON, LATICACIDVEN in the last 168 hours.  Recent Results (from the past 240 hour(s))  Resp Panel by RT-PCR (Flu A&B, Covid) Nasopharyngeal Swab     Status: None   Collection Time: 01/11/21 10:41 AM   Specimen: Nasopharyngeal Swab; Nasopharyngeal(NP) swabs in vial transport medium  Result Value Ref Range Status   SARS Coronavirus 2 by RT PCR NEGATIVE NEGATIVE Final    Comment: (NOTE) SARS-CoV-2 target nucleic acids are NOT DETECTED.  The SARS-CoV-2 RNA is generally detectable in upper respiratory specimens during the acute phase of infection. The lowest concentration of SARS-CoV-2 viral copies this assay can detect is 138 copies/mL. A negative result does  not preclude SARS-Cov-2 infection and should not be used as the sole basis for treatment or other patient management decisions. A negative result may occur with  improper specimen collection/handling, submission of specimen other than nasopharyngeal swab, presence of viral mutation(s) within the areas targeted by this assay, and inadequate number of viral copies(<138 copies/mL). A negative result must be combined with clinical observations, patient history, and epidemiological information. The expected result is Negative.  Fact Sheet for Patients:  EntrepreneurPulse.com.au  Fact Sheet for Healthcare Providers:  IncredibleEmployment.be  This test is no t yet approved or cleared by the [place] FDA and  has been authorized for detection and/or diagnosis of SARS-CoV-2  by FDA under an Emergency Use Authorization (EUA). This EUA will remain  in effect (meaning this test can be used) for the duration of the COVID-19 declaration under Section 564(b)(1) of the Act, 21 U.S.C.section 360bbb-3(b)(1), unless the authorization is terminated  or revoked sooner.       Influenza A by PCR NEGATIVE NEGATIVE Final   Influenza B by PCR NEGATIVE NEGATIVE Final    Comment: (NOTE) The Xpert Xpress SARS-CoV-2/FLU/RSV plus assay is intended as an aid in the diagnosis of influenza from Nasopharyngeal swab specimens and should not be used as a sole basis for treatment. Nasal washings and aspirates are unacceptable for Xpert Xpress SARS-CoV-2/FLU/RSV testing.  Fact Sheet for Patients: EntrepreneurPulse.com.au  Fact Sheet for Healthcare Providers: IncredibleEmployment.be  This test is not yet approved or cleared by the [place] FDA and has been authorized for detection and/or diagnosis of SARS-CoV-2 by FDA under an Emergency Use Authorization (EUA). This EUA will remain in effect (meaning this test can be used) for the duration of the COVID-19 declaration under Section 564(b)(1) of the Act, 21 U.S.C. section 360bbb-3(b)(1), unless the authorization is terminated or revoked.  Performed at [facility], [address], [place], [place] [address]          Radiology Studies: No results found.      Scheduled Meds: . acetaminophen  650 mg Oral Q6H  . amLODipine  2.5 mg Oral Daily  . cholecalciferol  1,000 Units Oral Daily  . cyanocobalamin  1,000 mcg Intramuscular Q30 days  . levothyroxine  88 mcg Oral q morning  . memantine  5 mg Oral BID  . multivitamin with minerals  1 tablet Oral Daily  . PARoxetine  10 mg Oral BID  . rivastigmine  3 mg Oral BID  . sodium chloride  1 g Oral TID   Continuous Infusions: . sodium chloride 75 mL/hr at 01/13/21 1002  .  ceFAZolin (ANCEF) IV       LOS: 2 days     Time spent: 15 minutes    Sidney Ace, MD [facility] Pager [phone]  If 7PM-7AM, please contact night-coverage 01/13/2021, 10:04 AM

## 2021-01-13 NOTE — Anesthesia Procedure Notes (Signed)
Procedure Name: Intubation Date/Time: 01/13/2021 3:00 PM Performed by: Nelda Marseille, CRNA Pre-anesthesia Checklist: Patient identified, Patient being monitored, Timeout performed, Emergency Drugs available and Suction available Patient Re-evaluated:Patient Re-evaluated prior to induction Oxygen Delivery Method: Circle system utilized Preoxygenation: Pre-oxygenation with 100% oxygen Induction Type: IV induction Ventilation: Mask ventilation without difficulty Laryngoscope Size: Mac, 3 and McGraph Grade View: Grade I Tube type: Oral Tube size: 7.0 mm Number of attempts: 1 Airway Equipment and Method: Stylet and Video-laryngoscopy Placement Confirmation: ETT inserted through vocal cords under direct vision,  positive ETCO2 and breath sounds checked- equal and bilateral Secured at: 21 cm Tube secured with: Tape Dental Injury: Teeth and Oropharynx as per pre-operative assessment

## 2021-01-13 NOTE — Transfer of Care (Signed)
Immediate Anesthesia Transfer of Care Note  Patient: Bethany Hicks  Procedure(s) Performed: OPEN REDUCTION INTERNAL FIXATION (ORIF) ANKLE FRACTURE-Bimalleolar (Right Ankle)  Patient Location: PACU  Anesthesia Type:General  Level of Consciousness: awake and sedated  Airway & Oxygen Therapy: Patient Spontanous Breathing and Patient connected to face mask oxygen  Post-op Assessment: Report given to RN and Post -op Vital signs reviewed and stable  Post vital signs: Reviewed and stable  Last Vitals:  Vitals Value Taken Time  BP 155/69 01/13/21 1800  Temp    Pulse 70 01/13/21 1802  Resp 23 01/13/21 1802  SpO2 99 % 01/13/21 1802  Vitals shown include unvalidated device data.  Last Pain:  Vitals:   01/13/21 1321  TempSrc: Temporal  PainSc: 0-No pain         Complications: No complications documented.

## 2021-01-14 LAB — CBC WITH DIFFERENTIAL/PLATELET
Abs Immature Granulocytes: 0.06 10*3/uL (ref 0.00–0.07)
Basophils Absolute: 0 10*3/uL (ref 0.0–0.1)
Basophils Relative: 0 %
Eosinophils Absolute: 0 10*3/uL (ref 0.0–0.5)
Eosinophils Relative: 0 %
HCT: 35.9 % — ABNORMAL LOW (ref 36.0–46.0)
Hemoglobin: 11.6 g/dL — ABNORMAL LOW (ref 12.0–15.0)
Immature Granulocytes: 1 %
Lymphocytes Relative: 10 %
Lymphs Abs: 0.7 10*3/uL (ref 0.7–4.0)
MCH: 30 pg (ref 26.0–34.0)
MCHC: 32.3 g/dL (ref 30.0–36.0)
MCV: 92.8 fL (ref 80.0–100.0)
Monocytes Absolute: 0.9 10*3/uL (ref 0.1–1.0)
Monocytes Relative: 13 %
Neutro Abs: 5.3 10*3/uL (ref 1.7–7.7)
Neutrophils Relative %: 76 %
Platelets: 165 10*3/uL (ref 150–400)
RBC: 3.87 MIL/uL (ref 3.87–5.11)
RDW: 14.6 % (ref 11.5–15.5)
WBC: 6.9 10*3/uL (ref 4.0–10.5)
nRBC: 0 % (ref 0.0–0.2)

## 2021-01-14 LAB — BASIC METABOLIC PANEL
Anion gap: 7 (ref 5–15)
BUN: 15 mg/dL (ref 8–23)
CO2: 26 mmol/L (ref 22–32)
Calcium: 8.2 mg/dL — ABNORMAL LOW (ref 8.9–10.3)
Chloride: 106 mmol/L (ref 98–111)
Creatinine, Ser: 0.78 mg/dL (ref 0.44–1.00)
GFR, Estimated: >60 mL/min (ref >60)
Glucose, Bld: 113 mg/dL — ABNORMAL HIGH (ref 70–99)
Potassium: 3.9 mmol/L (ref 3.5–5.1)
Sodium: 139 mmol/L (ref 135–145)

## 2021-01-14 LAB — MAGNESIUM: Magnesium: 1.8 mg/dL (ref 1.7–2.4)

## 2021-01-14 MED ORDER — AMLODIPINE BESYLATE 5 MG PO TABS
5.0000 mg | ORAL_TABLET | Freq: Every day | ORAL | Status: DC
  Administered 2021-01-14: 5 mg via ORAL
  Filled 2021-01-14: qty 1

## 2021-01-14 NOTE — Progress Notes (Signed)
PROGRESS NOTE    Bethany Hicks  PNT:[id number] DOB: September 01, 1928 DOA: 01/11/2021 PCP: Rusty Aus, MD   Brief Narrative:   87 y.o. female with medical history significant of hypertension, hyperlipidemia, GERD, hypothyroidism, depression, anxiety, panic attack, tobacco, atrial arrhythmia?, who presents with fall and right ankle pain.  Per her daughter at the bedside, patient fell when she was walking to the bathroom, and slipped on something wet on the floor. No LOC. She injured her right ankle, causing swelling and pain in right ankle. The pain is constant, moderate to severe, sharp, nonradiating. No leg numbness.  She also injured the back of head and developed a hematoma in the back of her head. No neck pain.  No unilateral numbness or tingling in extremities.  No facial droop or slurred speech, denies dizziness or lightheadedness.  Imaging confirmed multiple complex fractures in the right ankle and a disruption of the tibiotalar joint space.  Orthopedic consulted from admission.  Patient underwent operative fixation on 3/25.  Tolerated procedure well.  Postoperative pain minimal    Assessment & Plan:   Principal Problem:   Closed right ankle fracture Active Problems:   Hypothyroidism   HLD (hyperlipidemia)   Essential hypertension   Depression   Fall  Closed right ankle fracture  X-ray showed multiple complex fractures about the right ankle with severe disruption of the tibiotalar joint space as described above.  Patient has moderate pain now.  No neurovascular compromise.  Orthopedic surgeon, Dr. Marry Guan was consulted, Status post ORIF on 3/25 Plan: Multimodal pain control SQ Lovenox Therapy evaluations today Discharge either home with home health or skilled nursing facility pending therapy evaluations Bowel regimen while on narcotics   Hypothyroidism -Synthroid  HLD (hyperlipidemia)  Not taking medications currently -Outpatient follow-up  Essential  hypertension -As needed hydralazine IV -Amlodipine  Depression: -Paxil  Fall PT and OT to begin 3/26  Memory loss/dementia? -Namenda, Exelon     DVT prophylaxis: SCD Code Status: Full Family Communication:  Daughter at bedside 3/26 Disposition Plan:Status is: Inpatient  Remains inpatient appropriate because:Inpatient level of care appropriate due to severity of illness   Dispo: The patient is from: Home              Anticipated d/c is to: SNF              Patient currently is not medically stable to d/c.   Difficult to place patient No  Bimalleolar ankle fracture.  Status post operative fixation 3/25.  Therapy evaluations today.  Disposition within 48 hours.   Level of care: Med-Surg  Consultants:   Orthopedics  Procedures:   ORIF bimalleolar ankle fracture 3/25  Antimicrobials:   None   Subjective: Patient seen and examined.  Postoperative day #1.  Pain well controlled.  Objective: Vitals:   01/13/21 1945 01/13/21 2143 01/14/21 0312 01/14/21 0807  BP: (!) 159/77 (!) 156/77 (!) 162/80 (!) 177/89  Pulse: 79 72 65 73  Resp: 17 16 17 16   Temp: 98.3 F (36.8 C) 97.7 F (36.5 C) 97.7 F (36.5 C) 98.2 F (36.8 C)  TempSrc:    Oral  SpO2: 95% 96% 96% 95%  Weight:      Height:        Intake/Output Summary (Last 24 hours) at 01/14/2021 0956 Last data filed at 01/14/2021 0600 Gross per 24 hour  Intake 2186.77 ml  Output 2110 ml  Net 76.77 ml   Filed Weights   01/11/21 0628  Weight: 74.4 kg  Examination: General: No apparent distress, patient appears well HEENT: Normocephalic, atraumatic Neck, supple, trachea midline, no tenderness Heart: Regular rate and rhythm, S1/S2 normal, no murmurs Lungs: Clear to auscultation bilaterally, no adventitious sounds, normal work of breathing Abdomen: Soft, nontender, nondistended, positive bowel sounds Extremities: Right foot in surgical dressing and boot, not removed, sensation intact, dressing  CDI Neurologic: Cranial nerves grossly intact, sensation intact, alert and oriented x3 Psychiatric: Normal affect  Data Reviewed: I have personally reviewed following labs and imaging studies  CBC: Recent Labs  Lab 01/11/21 0813 01/12/21 0544 01/14/21 0759  WBC 7.6 6.6 6.9  NEUTROABS 5.7  --  5.3  HGB 12.6 10.9* 11.6*  HCT 38.2 33.8* 35.9*  MCV 91.4 91.8 92.8  PLT 175 149* 222   Basic Metabolic Panel: Recent Labs  Lab 01/11/21 0813 01/12/21 0544 01/14/21 0759  NA 140 139 139  K 3.5 3.3* 3.9  CL 105 103 106  CO2 26 29 26   GLUCOSE 95 108* 113*  BUN 20 18 15   CREATININE 0.83 0.91 0.78  CALCIUM 8.7* 8.6* 8.2*  MG  --   --  1.8   GFR: Estimated Creatinine Clearance: 47.2 mL/min (by C-G formula based on SCr of 0.78 mg/dL). Liver Function Tests: No results for input(s): AST, ALT, ALKPHOS, BILITOT, PROT, ALBUMIN in the last 168 hours. No results for input(s): LIPASE, AMYLASE in the last 168 hours. No results for input(s): AMMONIA in the last 168 hours. Coagulation Profile: Recent Labs  Lab 01/11/21 0813  INR 1.0   Cardiac Enzymes: No results for input(s): CKTOTAL, CKMB, CKMBINDEX, TROPONINI in the last 168 hours. BNP (last 3 results) No results for input(s): PROBNP in the last 8760 hours. HbA1C: No results for input(s): HGBA1C in the last 72 hours. CBG: No results for input(s): GLUCAP in the last 168 hours. Lipid Profile: No results for input(s): CHOL, HDL, LDLCALC, TRIG, CHOLHDL, LDLDIRECT in the last 72 hours. Thyroid Function Tests: No results for input(s): TSH, T4TOTAL, FREET4, T3FREE, THYROIDAB in the last 72 hours. Anemia Panel: No results for input(s): VITAMINB12, FOLATE, FERRITIN, TIBC, IRON, RETICCTPCT in the last 72 hours. Sepsis Labs: No results for input(s): PROCALCITON, LATICACIDVEN in the last 168 hours.  Recent Results (from the past 240 hour(s))  Resp Panel by RT-PCR (Flu A&B, Covid) Nasopharyngeal Swab     Status: None   Collection Time:  01/11/21 10:41 AM   Specimen: Nasopharyngeal Swab; Nasopharyngeal(NP) swabs in vial transport medium  Result Value Ref Range Status   SARS Coronavirus 2 by RT PCR NEGATIVE NEGATIVE Final    Comment: (NOTE) SARS-CoV-2 target nucleic acids are NOT DETECTED.  The SARS-CoV-2 RNA is generally detectable in upper respiratory specimens during the acute phase of infection. The lowest concentration of SARS-CoV-2 viral copies this assay can detect is 138 copies/mL. A negative result does not preclude SARS-Cov-2 infection and should not be used as the sole basis for treatment or other patient management decisions. A negative result may occur with  improper specimen collection/handling, submission of specimen other than nasopharyngeal swab, presence of viral mutation(s) within the areas targeted by this assay, and inadequate number of viral copies(<138 copies/mL). A negative result must be combined with clinical observations, patient history, and epidemiological information. The expected result is Negative.  Fact Sheet for Patients:  EntrepreneurPulse.com.au  Fact Sheet for Healthcare Providers:  IncredibleEmployment.be  This test is no t yet approved or cleared by the [place] FDA and  has been authorized for detection and/or diagnosis of SARS-CoV-2  by FDA under an Emergency Use Authorization (EUA). This EUA will remain  in effect (meaning this test can be used) for the duration of the COVID-19 declaration under Section 564(b)(1) of the Act, 21 U.S.C.section 360bbb-3(b)(1), unless the authorization is terminated  or revoked sooner.       Influenza A by PCR NEGATIVE NEGATIVE Final   Influenza B by PCR NEGATIVE NEGATIVE Final    Comment: (NOTE) The Xpert Xpress SARS-CoV-2/FLU/RSV plus assay is intended as an aid in the diagnosis of influenza from Nasopharyngeal swab specimens and should not be used as a sole basis for treatment. Nasal washings  and aspirates are unacceptable for Xpert Xpress SARS-CoV-2/FLU/RSV testing.  Fact Sheet for Patients: EntrepreneurPulse.com.au  Fact Sheet for Healthcare Providers: IncredibleEmployment.be  This test is not yet approved or cleared by the [place] FDA and has been authorized for detection and/or diagnosis of SARS-CoV-2 by FDA under an Emergency Use Authorization (EUA). This EUA will remain in effect (meaning this test can be used) for the duration of the COVID-19 declaration under Section 564(b)(1) of the Act, 21 U.S.C. section 360bbb-3(b)(1), unless the authorization is terminated or revoked.  Performed at [facility], [address], [place], [place] [address]          Radiology Studies: DG MINI C-ARM IMAGE ONLY  Result Date: 01/13/2021 There is no interpretation for this exam.  This order is for images obtained during a surgical procedure.  Please See "Surgeries" Tab for more information regarding the procedure.        Scheduled Meds: . amLODipine  5 mg Oral Daily  . Chlorhexidine Gluconate Cloth  6 each Topical Daily  . cholecalciferol  1,000 Units Oral Daily  . cyanocobalamin  1,000 mcg Intramuscular Q30 days  . enoxaparin (LOVENOX) injection  40 mg Subcutaneous Q24H  . ferrous sulfate  325 mg Oral BID WC  . levothyroxine  88 mcg Oral q morning  . memantine  5 mg Oral BID  . multivitamin with minerals  1 tablet Oral Daily  . pantoprazole  40 mg Oral Daily  . PARoxetine  10 mg Oral BID  . rivastigmine  3 mg Oral BID  . senna-docusate  1 tablet Oral BID  . sodium chloride  1 g Oral TID   Continuous Infusions: . sodium chloride 100 mL/hr at 01/13/21 2033  . acetaminophen 1,000 mg (01/14/21 0945)  .  ceFAZolin (ANCEF) IV 2 g (01/14/21 0248)     LOS: 3 days    Time spent: 25 minutes    Sidney Ace, MD [facility] Pager [phone]  If 7PM-7AM, please contact  night-coverage 01/14/2021, 9:56 AM

## 2021-01-14 NOTE — Evaluation (Signed)
Occupational Therapy Evaluation Patient Details Name: Bethany Hicks MRN: [id number] DOB: 11/15/1927 Today's Date: 01/14/2021    History of Present Illness 87 y.o. female with medical history significant of hypertension, hyperlipidemia, GERD, hypothyroidism, depression, anxiety, panic attack, tobacco, atrial arrhythmia?, who presents with fall and right ankle pain, found to be fractured.   s/p R ankle ORIF 3/25.   Clinical Impression   Ms Scheier was seen for OT evaluation this date. Prior to hospital admission, pt was MOD I for mobility and ADLs, required assist from aid for IADLs and bathing. Pt lives alone with aide available 8-1 and family PRN. Pt presents to acute OT demonstrating impaired ADL performance and functional mobility 2/2 decreased activity tolerance, functional strength/ROM/balance deficits, and poor safety awareness. Pt currently requires MIN A + RW chair>BSC decreasing to MOD A squat picot t/f BSC>chair as pt fatigued. Pt completed seated therex as described below while seated on BSC (maintained RLE NWBing t/o). MAX A perihygiene in standing. MOD A for LBD seated EOC. Pt would benefit from skilled OT to address noted impairments and functional limitations (see below for any additional details) in order to maximize safety and independence while minimizing falls risk and caregiver burden. Upon hospital discharge, recommend STR to maximize pt safety and return to PLOF.     Follow Up Recommendations  SNF    Equipment Recommendations  Wheelchair (measurements OT) (if chose to d/c home will need wheelchair)    Recommendations for Other Services       Precautions / Restrictions Precautions Precautions: Fall Restrictions Weight Bearing Restrictions: Yes RLE Weight Bearing: Non weight bearing      Mobility Bed Mobility Overal bed mobility: Needs Assistance Bed Mobility: Supine to Sit     Supine to sit: Min assist     General bed mobility comments: pt received and left up in  chair    Transfers Overall transfer level: Needs assistance Equipment used: Rolling walker (2 wheeled) Transfers: Stand Pivot Transfers;Squat Pivot Transfers Sit to Stand: Min assist;From elevated surface Stand pivot transfers: Min assist Squat pivot transfers: Mod assist     General transfer comment: maintained NWBing t/o c MIN cues. Pt greatly fatigued following BUE exercises seated on BSC    Balance Overall balance assessment: Needs assistance Sitting-balance support: No upper extremity supported;Feet supported Sitting balance-Leahy Scale: Good     Standing balance support: Bilateral upper extremity supported Standing balance-Leahy Scale: Poor Standing balance comment: poor safety awareness, difficulty transitioning hands from armrest to RW                           ADL either performed or assessed with clinical judgement   ADL Overall ADL's : Needs assistance/impaired                                       General ADL Comments: MIN A + RW chair>BSC decreasing to MOD A squat picot t/f BSC>chair as pt fatigued. MAX A perihygiene in standing. MOD A for LBD seated EOC                  Pertinent Vitals/Pain Pain Assessment: Faces Pain Score: 4  Faces Pain Scale: Hurts a little bit Pain Location: R ankle Pain Descriptors / Indicators: Aching;Discomfort;Dull Pain Intervention(s): Limited activity within patient's tolerance;Premedicated before session;Repositioned     Hand Dominance     Extremity/Trunk Assessment  Upper Extremity Assessment Upper Extremity Assessment: Overall WFL for tasks assessed   Lower Extremity Assessment Lower Extremity Assessment: Generalized weakness       Communication Communication Communication: No difficulties   Cognition Arousal/Alertness: Awake/alert Behavior During Therapy: WFL for tasks assessed/performed Overall Cognitive Status: History of cognitive impairments - at baseline                                      General Comments       Exercises Exercises: Other exercises;General Lower Extremity;General Upper Extremity General Exercises - Upper Extremity Chair Push Up: Strengthening;20 reps;Seated (RLE NWBing t/o) General Exercises - Lower Extremity Hip Flexion/Marching: AROM;Strengthening;Both;20 reps;Seated Other Exercises Other Exercises: Pt and family educated re: OT role, DME recs, d/c recs, falls prevention, ECS, pain mgmt, HEP Other Exercises: LBD, toileting, SPT, squat pivot t/f, sitting/standing balance/tolerance, ~3 ft mobility   Shoulder Instructions      Home Living Family/patient expects to be discharged to:: Private residence Living Arrangements: Alone Available Help at Discharge: Personal care attendant;Family;Available PRN/intermittently (aide 8am-1pm M-F) Type of Home: House Home Access: Stairs to enter [employer] of Steps: 2 Entrance Stairs-Rails: None Home Layout: One level               Home Equipment: Walker - 2 wheels;Cane - single point;Shower seat - built in;Bedside commode          Prior Functioning/Environment Level of Independence: Needs assistance  Gait / Transfers Assistance Needed: Pt would go to grocery store with aide/family  with some regularity. ADL's / Homemaking Assistance Needed: Pt can get dressed and make light meals, needs assist with med management, house work            OT Problem List: Decreased strength;Decreased range of motion;Decreased activity tolerance;Impaired balance (sitting and/or standing);Decreased safety awareness      OT Treatment/Interventions: Self-care/ADL training;Therapeutic exercise;Energy conservation;DME and/or AE instruction;Therapeutic activities;Patient/family education;Balance training    OT Goals(Current goals can be found in the care plan section) Acute Rehab OT Goals Patient Stated Goal: Go to rehab for a while and return home OT Goal Formulation: With  patient/family Time For Goal Achievement: 01/28/21 Potential to Achieve Goals: Good ADL Goals Pt Will Perform Grooming: with modified independence;sitting Pt Will Perform Lower Body Dressing: with min assist;sit to/from stand (c LRAD PRN) Pt Will Transfer to Toilet: with modified independence;stand pivot transfer;bedside commode (c LRAD PRN)  OT Frequency: Min 1X/week   Barriers to D/C: Decreased caregiver support             AM-PAC OT "6 Clicks" Daily Activity     Outcome Measure Help from another person eating meals?: None Help from another person taking care of personal grooming?: A Little Help from another person toileting, which includes using toliet, bedpan, or urinal?: A Lot Help from another person bathing (including washing, rinsing, drying)?: A Lot Help from another person to put on and taking off regular upper body clothing?: A Little Help from another person to put on and taking off regular lower body clothing?: A Lot 6 Click Score: 16   End of Session Equipment Utilized During Treatment: Rolling walker Nurse Communication: Mobility status  Activity Tolerance: Patient tolerated treatment well Patient left: in chair;with call bell/phone within reach;with family/visitor present  OT Visit Diagnosis: Unsteadiness on feet (R26.81);Other abnormalities of gait and mobility (R26.89);Muscle weakness (generalized) (M62.81)  Time: 9675-9163 OT Time Calculation (min): 27 min Charges:  OT General Charges $OT Visit: 1 Visit OT Evaluation $OT Eval Low Complexity: 1 Low OT Treatments $Self Care/Home Management : 8-22 mins  Dessie Coma, M.S. OTR/L  01/14/21, 2:44 PM  ascom [phone]

## 2021-01-14 NOTE — Progress Notes (Signed)
  Subjective: 1 Day Post-Op Procedure(s) (LRB): OPEN REDUCTION INTERNAL FIXATION (ORIF) ANKLE FRACTURE-Bimalleolar (Right) Patient reports pain as mild.   Patient is well, and has had no acute complaints or problems Plan is to go Home after hospital stay. Negative for chest pain and shortness of breath Fever: no Gastrointestinal: Negative for nausea and vomiting  Objective: Vital signs in last 24 hours: Temp:  [97 F (36.1 C)-99.8 F (37.7 C)] 97.7 F (36.5 C) (03/26 0312) Pulse Rate:  [62-80] 65 (03/26 0312) Resp:  [14-22] 17 (03/26 0312) BP: (130-177)/(68-95) 162/80 (03/26 0312) SpO2:  [92 %-100 %] 96 % (03/26 0312)  Intake/Output from previous day:  Intake/Output Summary (Last 24 hours) at 01/14/2021 0658 Last data filed at 01/14/2021 0600 Gross per 24 hour  Intake 2186.77 ml  Output 2110 ml  Net 76.77 ml    Intake/Output this shift: Total I/O In: 486.8 [P.O.:480; I.V.:2.7; IV Piggyback:4.1] Out: 950 [Urine:950]  Labs: Recent Labs    01/11/21 0813 01/12/21 0544  HGB 12.6 10.9*   Recent Labs    01/11/21 0813 01/12/21 0544  WBC 7.6 6.6  RBC 4.18 3.68*  HCT 38.2 33.8*  PLT 175 149*   Recent Labs    01/11/21 0813 01/12/21 0544  NA 140 139  K 3.5 3.3*  CL 105 103  CO2 26 29  BUN 20 18  CREATININE 0.83 0.91  GLUCOSE 95 108*  CALCIUM 8.7* 8.6*   Recent Labs    01/11/21 0813  INR 1.0     EXAM General - Patient is Alert and Oriented Extremity - Neurovascular intact Sensation intact distally Dressing/Incision - clean, dry, with the splint intact Motor Function - intact, moving foot and toes well on exam.   Past Medical History:  Diagnosis Date  . Anxiety   . Fluid collection (edema) in the arms, legs, hands and feet   . History of panic attacks   . Hypertension   . Hypothyroidism     Assessment/Plan: 1 Day Post-Op Procedure(s) (LRB): OPEN REDUCTION INTERNAL FIXATION (ORIF) ANKLE FRACTURE-Bimalleolar (Right) Principal Problem:   Closed  right ankle fracture Active Problems:   Hypothyroidism   HLD (hyperlipidemia)   Essential hypertension   Depression   Fall  Estimated body mass index is 27.29 kg/m as calculated from the following:   Height as of this encounter: 5\' 5"  (1.651 m).   Weight as of this encounter: 74.4 kg. Advance diet Up with therapy D/C IV fluids   Discharge planning with care management.  Plan to discharge home when safe.  Follow-up at [facility] clinic orthopedics in 2 weeks for staple removal and x-rays with Dr. Marry Guan.  DVT Prophylaxis - Lovenox None Weight-Bearing to right leg  Reche Dixon, PA-C Orthopaedic Surgery 01/14/2021, 6:58 AM

## 2021-01-14 NOTE — Evaluation (Signed)
Physical Therapy Evaluation Patient Details Name: Bethany Hicks MRN: [id number] DOB: [birth year] Today's Date: 01/14/2021   History of Present Illness  87 y.o. female with medical history significant of hypertension, hyperlipidemia, GERD, hypothyroidism, depression, anxiety, panic attack, tobacco, atrial arrhythmia?, who presents with fall and right ankle pain, found to be fractured.   s/p R ankle ORIF 3/25.  Clinical Impression  Pt pleasant and showed good effort with all tasks during PT exam and subsequent mobility training/theraputic activity.  She showed great effort and did surprisingly well maintaining NWBing on R LE during brief standing/transfer effort.  She lives alone but does have QD assist (that is not 24/7) and would not be able to return home safely; pt will need STR at d/c.    Follow Up Recommendations SNF    Equipment Recommendations  Rolling walker with 5" wheels    Recommendations for Other Services       Precautions / Restrictions Precautions Precautions: Fall Restrictions RLE Weight Bearing: Weight bearing as tolerated      Mobility  Bed Mobility Overal bed mobility: Needs Assistance Bed Mobility: Supine to Sit     Supine to sit: Min assist     General bed mobility comments: light assist to guide R LE off EOB as well as to assist elevating trunk to upright/sitting EOB    Transfers Overall transfer level: Needs assistance Equipment used: Rolling walker (2 wheeled) Transfers: Sit to/from Stand Sit to Stand: Min assist;From elevated surface         General transfer comment: Pt showed great effort and did better than expected with rising to standing.  She needed cuing for set up (hand and foot placement) and sequencing as well as explicit reminders to insure [place] on the R; which she did very well maintaining even with "prolonged" standing  Ambulation/Gait             General Gait Details: No true ambulation, but with walker, close assist and  plenty of cuing she showed (most importantly) the ability to keep weight off the R.  With great effort she was able to heel toe and then even lift and take small hops with L foot.  She was unable to tolerate any prolonged standing tasks, but ultimately did quite well for first time up and trying to maintain WBing.  Stairs            Wheelchair Mobility    Modified Rankin (Stroke Patients Only)       Balance Overall balance assessment: Needs assistance Sitting-balance support: Bilateral upper extremity supported Sitting balance-Leahy Scale: Good     Standing balance support: Bilateral upper extremity supported Standing balance-Leahy Scale: Poor Standing balance comment: Pt did not have any overt LOBs and actually did well with brief standing bout, however she showed poor confidence/tolerance with standing and despite keeping weight off R LE well does not show great safety in standing.                             Pertinent Vitals/Pain Pain Assessment: 0-10 Pain Score: 4  Pain Location: R ankle    Home Living Family/patient expects to be discharged to:: Skilled nursing facility Living Arrangements: Alone Available Help at Discharge: Personal care attendant;Family;Available PRN/intermittently (aide 8am-1pm M-F)   Home Access: Stairs to enter Entrance Stairs-Rails: None Entrance Stairs-Number of Steps: 2   Home Equipment: [place] - 2 wheels;Cane - single point;Shower seat - built in;Bedside commode  Prior Function Level of Independence: Needs assistance   Gait / Transfers Assistance Needed: Pt would apparently go to grocery store with aide/family and be out with some regularity.  ADL's / Homemaking Assistance Needed: Pt can get dressed and make light meals, needs assist with med management, house work        Hand Dominance        Extremity/Trunk Assessment   Upper Extremity Assessment Upper Extremity Assessment: Generalized weakness;Overall WFL  for tasks assessed (age appropriate defecits)    Lower Extremity Assessment Lower Extremity Assessment: Overall WFL for tasks assessed;Generalized weakness (R hip and knee functional, ankle NT, L LE WFL)       Communication   Communication: No difficulties  Cognition Arousal/Alertness: Awake/alert Behavior During Therapy: WFL for tasks assessed/performed Overall Cognitive Status: Within Functional Limits for tasks assessed (per daughter cognition seems to be declining needing increased help and explanations with basic day to day tasks)                                        General Comments      Exercises     Assessment/Plan    PT Assessment Patient needs continued PT services  PT Problem List Decreased strength;Decreased range of motion;Decreased mobility;Decreased balance;Decreased activity tolerance;Decreased knowledge of use of DME;Decreased safety awareness;Pain;Decreased cognition;Decreased knowledge of precautions       PT Treatment Interventions DME instruction;Gait training;Functional mobility training;Therapeutic activities;Therapeutic exercise;Stair training;Balance training;Cognitive remediation;Neuromuscular re-education;Patient/family education    PT Goals (Current goals can be found in the Care Plan section)  Acute Rehab PT Goals Patient Stated Goal: Go to rehab for a while and return home PT Goal Formulation: With patient/family Time For Goal Achievement: 01/28/21 Potential to Achieve Goals: Fair    Frequency BID   Barriers to discharge        Co-evaluation               AM-PAC PT "6 Clicks" Mobility  Outcome Measure Help needed turning from your back to your side while in a flat bed without using bedrails?: A Little Help needed moving from lying on your back to sitting on the side of a flat bed without using bedrails?: A Little Help needed moving to and from a bed to a chair (including a wheelchair)?: A Lot Help needed standing  up from a chair using your arms (e.g., wheelchair or bedside chair)?: A Lot Help needed to walk in hospital room?: Total Help needed climbing 3-5 steps with a railing? : Total 6 Click Score: 12    End of Session Equipment Utilized During Treatment: Gait belt Activity Tolerance: Patient tolerated treatment well;Patient limited by fatigue Patient left: with call bell/phone within reach;with chair alarm set;with family/visitor present Nurse Communication: Mobility status PT Visit Diagnosis: Muscle weakness (generalized) (M62.81);Difficulty in walking, not elsewhere classified (R26.2)    Time: 8502-7741 PT Time Calculation (min) (ACUTE ONLY): 33 min   Charges:   PT Evaluation $PT Eval Low Complexity: 1 Low PT Treatments $Therapeutic Activity: 8-22 mins        Kreg Shropshire, DPT 01/14/2021, 12:33 PM

## 2021-01-15 MED ORDER — ENOXAPARIN SODIUM 40 MG/0.4ML ~~LOC~~ SOLN: 40.0000 mg | SUBCUTANEOUS | 0 refills | Status: DC

## 2021-01-15 MED ORDER — AMLODIPINE BESYLATE 10 MG PO TABS
10.0000 mg | ORAL_TABLET | Freq: Every day | ORAL | Status: DC
  Administered 2021-01-15 – 2021-01-17 (×3): 10 mg via ORAL
  Filled 2021-01-15 (×3): qty 1

## 2021-01-15 MED ORDER — OXYCODONE HCL 5 MG PO TABS: 5.0000 mg | ORAL_TABLET | Freq: Four times a day (QID) | ORAL | 0 refills | Status: DC | PRN

## 2021-01-15 MED ORDER — TRAMADOL HCL 50 MG PO TABS: 50.0000 mg | ORAL_TABLET | Freq: Four times a day (QID) | ORAL | 0 refills | Status: DC | PRN

## 2021-01-15 NOTE — NC FL2 (Signed)
[place] LEVEL OF CARE SCREENING TOOL     IDENTIFICATION  Patient Name: Bethany Hicks Birthdate: 1928-09-07 Sex: female Admission Date (Current Location): 01/11/2021  [place] and [place] Number:  Engineering geologist and Address:  [facility], [address] [place], [place] [address]      Provider Number: [id number]  Attending Physician Name and Address:  Sidney Ace, MD  Relative Name and Phone Number:       Current Level of Care: Hospital Recommended Level of Care: [place] Prior Approval Number:    Date Approved/Denied:   PASRR Number: [id number]  Discharge Plan: SNF    Current Diagnoses: Patient Active Problem List   Diagnosis Date Noted  . Closed right ankle fracture 01/11/2021  . Depression 01/11/2021  . Fall 01/11/2021  . HLD (hyperlipidemia) 07/21/2007  . ANXIETY 07/21/2007  . TOBACCO ABUSE 07/21/2007  . Essential hypertension 07/21/2007  . ALLERGIC RHINITIS 07/21/2007  . GERD 07/21/2007  . DIVERTICULOSIS, COLON 07/21/2007  . OSTEOPENIA 07/21/2007  . ATRIAL ARRHYTHMIAS 07/04/2007  . SKIN LESION 07/04/2007  . Hypothyroidism 03/20/2007  . HYPERCHOLESTEROLEMIA, PURE 03/20/2007    Orientation RESPIRATION BLADDER Height & Weight     Self,Situation,Place  Normal Continent Weight: 164 lb (74.4 kg) Height:  5\' 5"  (165.1 cm)  BEHAVIORAL SYMPTOMS/MOOD NEUROLOGICAL BOWEL NUTRITION STATUS      Continent Diet  AMBULATORY STATUS COMMUNICATION OF NEEDS Skin   Extensive Assist Verbally Normal,Surgical wounds (right ankle)                       Personal Care Assistance Level of Assistance  Bathing,Feeding,Dressing Bathing Assistance: Limited assistance Feeding assistance: Limited assistance Dressing Assistance: Limited assistance     Functional Limitations Info  Sight,Hearing,Speech Sight Info: Impaired Hearing Info: Impaired Speech Info: Adequate    SPECIAL CARE FACTORS FREQUENCY  OT  (By licensed OT),PT (By licensed PT)     PT Frequency: 5x week OT Frequency: 5x week            Contractures Contractures Info: Not present    Additional Factors Info  Code Status Code Status Info: Full             Current Medications (01/15/2021):  This is the current hospital active medication list Current Facility-Administered Medications  Medication Dose Route Frequency Provider Last Rate Last Admin  . acetaminophen (TYLENOL) tablet 325-650 mg  325-650 mg Oral Q6H PRN Dereck Leep, MD   650 mg at 01/15/21 0616  . amLODipine (NORVASC) tablet 10 mg  10 mg Oral Daily Ralene Muskrat B, MD   10 mg at 01/15/21 4097  . bisacodyl (DULCOLAX) suppository 10 mg  10 mg Rectal Daily PRN Hooten, Laurice Record, MD      . Chlorhexidine Gluconate Cloth 2 % PADS 6 each  6 each Topical Daily Ralene Muskrat B, MD   6 each at 01/14/21 1040  . cholecalciferol (VITAMIN D3) tablet 1,000 Units  1,000 Units Oral Daily Dereck Leep, MD   1,000 Units at 01/15/21 [phone]  . cyanocobalamin ((VITAMIN B-12)) injection 1,000 mcg  1,000 mcg Intramuscular Q30 days Hooten, Laurice Record, MD      . enoxaparin (LOVENOX) injection 40 mg  40 mg Subcutaneous Q24H Hooten, Laurice Record, MD   40 mg at 01/15/21 0834  . ferrous sulfate tablet 325 mg  325 mg Oral BID WC Hooten, Laurice Record, MD   325 mg at 01/15/21 9924  . hydrALAZINE (  APRESOLINE) injection 5 mg  5 mg Intravenous Q2H PRN Hooten, Laurice Record, MD      . HYDROmorphone (DILAUDID) injection 0.5-1 mg  0.5-1 mg Intravenous Q4H PRN Hooten, Laurice Record, MD      . levothyroxine (SYNTHROID) tablet 88 mcg  88 mcg Oral q morning Dereck Leep, MD   88 mcg at 01/15/21 0535  . magnesium hydroxide (MILK OF MAGNESIA) suspension 30 mL  30 mL Oral Daily PRN Hooten, Laurice Record, MD      . memantine [facility]) tablet 5 mg  5 mg Oral BID Hooten, Laurice Record, MD   5 mg at 01/15/21 2563  . menthol-cetylpyridinium (CEPACOL) lozenge 3 mg  1 lozenge Oral PRN Hooten, Laurice Record, MD       Or  . phenol  (CHLORASEPTIC) mouth spray 1 spray  1 spray Mouth/Throat PRN Hooten, Laurice Record, MD      . methocarbamol (ROBAXIN) tablet 500 mg  500 mg Oral Q8H PRN Dereck Leep, MD   500 mg at 01/15/21 8937  . metoCLOPramide (REGLAN) tablet 5-10 mg  5-10 mg Oral Q8H PRN Hooten, Laurice Record, MD       Or  . metoCLOPramide (REGLAN) injection 5-10 mg  5-10 mg Intravenous Q8H PRN Hooten, Laurice Record, MD      . multivitamin with minerals tablet 1 tablet  1 tablet Oral Daily Hooten, Laurice Record, MD   1 tablet at 01/15/21 0831  . ondansetron (ZOFRAN) tablet 4 mg  4 mg Oral Q6H PRN Hooten, Laurice Record, MD       Or  . ondansetron (ZOFRAN) injection 4 mg  4 mg Intravenous Q6H PRN Hooten, Laurice Record, MD      . oxyCODONE (Oxy IR/ROXICODONE) immediate release tablet 10 mg  10 mg Oral Q4H PRN Hooten, Laurice Record, MD      . oxyCODONE (Oxy IR/ROXICODONE) immediate release tablet 5 mg  5 mg Oral Q4H PRN Hooten, Laurice Record, MD   5 mg at 01/15/21 0310  . pantoprazole (PROTONIX) EC tablet 40 mg  40 mg Oral Daily Hooten, Laurice Record, MD   40 mg at 01/15/21 3428  . PARoxetine (PAXIL) tablet 10 mg  10 mg Oral BID Dereck Leep, MD   10 mg at 01/15/21 0535  . rivastigmine (EXELON) capsule 3 mg  3 mg Oral BID Hooten, Laurice Record, MD   3 mg at 01/15/21 1220  . senna-docusate (Senokot-S) tablet 1 tablet  1 tablet Oral BID Dereck Leep, MD   1 tablet at 01/15/21 [phone]  . sodium chloride tablet 1 g  1 g Oral TID Dereck Leep, MD   1 g at 01/15/21 1220  . sodium phosphate (FLEET) 7-19 GM/118ML enema 1 enema  1 enema Rectal Once PRN Hooten, Laurice Record, MD      . traMADol Veat[facility]) tablet 50-100 mg  50-100 mg Oral Q4H PRN Dereck Leep, MD   100 mg at 01/15/21 1572     Discharge Medications: Please see discharge summary for a list of discharge medications.  Relevant Imaging Results:  Relevant Lab Results:   Additional Information SS: [id number]  Boris Sharper, LCSW

## 2021-01-15 NOTE — Progress Notes (Signed)
Physical Therapy Treatment Patient Details Name: Bethany Hicks MRN: [id number] DOB: 01-22-1928 Today's Date: 01/15/2021    History of Present Illness 87 y.o. female with medical history significant of hypertension, hyperlipidemia, GERD, hypothyroidism, depression, anxiety, panic attack, tobacco, atrial arrhythmia?, who presents with fall and right ankle pain, found to be fractured.   s/p R ankle ORIF 3/25.    PT Comments    Pt continues to show great effort and motivation with PT sessions.  She was able to do b/l LE exercises (mostly with light resist, apart from L WBing tasks) with great quality of motion and strength.  She needed light assist for bed mobility and transfers, but ultimately did quite well with these.  She needed some extra cuing to insure compliance with NWBing, but also did very well with this.  She fatigued quickly with upright/mobility tasks, but did manage to walk/hop nearly 10 feet with walker and plenty of cuing and guidance.  Great overall effort but will continue to be functionally limited until [place] is allowed, continue to recommend STR as the only safe d/c option.     Follow Up Recommendations  SNF, if family is adamant about not doing rehab she will need 24/7 supervision and a w/c     Equipment Recommendations  Rolling walker with 5" wheels    Recommendations for Other Services       Precautions / Restrictions Precautions Precautions: Fall Restrictions RLE Weight Bearing: Non weight bearing    Mobility  Bed Mobility Overal bed mobility: Needs Assistance Bed Mobility: Supine to Sit     Supine to sit: Min assist;Min guard     General bed mobility comments: Pt needed only very light cuing/assist to get supine to sit    Transfers Overall transfer level: Needs assistance Equipment used: Rolling walker (2 wheeled) Transfers: Sit to/from Stand Sit to Stand: Min assist         General transfer comment: Pt needing repeated cues to insure NWBing, but  once set was able to do so.  She was not able to rise on her own on first attempt (from standard height bed) but needed only light assist to maintain upward push to standing but  Ambulation/Gait Ambulation/Gait assistance: Modified independent (Device/Increase time) Gait Distance (Feet): 9 Feet Assistive device: Rolling walker (2 wheeled)       General Gait Details: Pt showed great effort with hopping/ambulation.  She was able to maintain R NWBing surprisingly well, but did fatigue quickly with prolonged time in standing.   Stairs             Wheelchair Mobility    Modified Rankin (Stroke Patients Only)       Balance Overall balance assessment: Needs assistance Sitting-balance support: No upper extremity supported;Feet supported Sitting balance-Leahy Scale: Good     Standing balance support: Bilateral upper extremity supported Standing balance-Leahy Scale: Fair Standing balance comment: Pt with better awareness and execution with UE use on walker/rails.  Still obviously needing UEs to maitnain NWBing and quickly fatigues with standing tasks                            Cognition Arousal/Alertness: Awake/alert Behavior During Therapy: WFL for tasks assessed/performed Overall Cognitive Status: History of cognitive impairments - at baseline  General Comments: Pt very pleasant and eager to work with PT      Exercises General Exercises - Lower Extremity Quad Sets: Strengthening;10 reps Short Arc Quad: AROM;10 reps Heel Slides: AROM;10 reps (resisted leg extensions on L) Hip ABduction/ADduction: Strengthening;10 reps Straight Leg Raises: AROM;10 reps    General Comments        Pertinent Vitals/Pain Pain Assessment: No/denies pain Pain Score:  (Pt has had very little pain since surgery)    Home Living                      Prior Function            PT Goals (current goals can now be found in  the care plan section) Progress towards PT goals: Progressing toward goals    Frequency    BID      PT Plan Current plan remains appropriate    Co-evaluation              AM-PAC PT "6 Clicks" Mobility   Outcome Measure  Help needed turning from your back to your side while in a flat bed without using bedrails?: A Little Help needed moving from lying on your back to sitting on the side of a flat bed without using bedrails?: A Little Help needed moving to and from a bed to a chair (including a wheelchair)?: A Lot Help needed standing up from a chair using your arms (e.g., wheelchair or bedside chair)?: A Lot Help needed to walk in hospital room?: Total Help needed climbing 3-5 steps with a railing? : Total 6 Click Score: 12    End of Session Equipment Utilized During Treatment: Gait belt Activity Tolerance: Patient tolerated treatment well;Patient limited by fatigue Patient left: with call bell/phone within reach;with chair alarm set;with family/visitor present Nurse Communication: Mobility status PT Visit Diagnosis: Muscle weakness (generalized) (M62.81);Difficulty in walking, not elsewhere classified (R26.2)     Time: 8295-6213 PT Time Calculation (min) (ACUTE ONLY): 26 min  Charges:  $Gait Training: 8-22 mins $Therapeutic Exercise: 8-22 mins                     Kreg Shropshire, DPT 01/15/2021, 2:04 PM

## 2021-01-15 NOTE — Discharge Instructions (Signed)
INSTRUCTIONS AFTER Surgery  o Remove items at home which could result in a fall. This includes throw rugs or furniture in walking pathways o ICE to the affected joint every three hours while awake for 30 minutes at a time, for at least the first 3-5 days, and then as needed for pain and swelling.  Continue to use ice for pain and swelling. You may notice swelling that will progress down to the foot and ankle.  This is normal after surgery.  Elevate your leg when you are not up walking on it.   o Continue to use the breathing machine you got in the hospital (incentive spirometer) which will help keep your temperature down.  It is common for your temperature to cycle up and down following surgery, especially at night when you are not up moving around and exerting yourself.  The breathing machine keeps your lungs expanded and your temperature down.   DIET:  As you were doing prior to hospitalization, we recommend a well-balanced diet.  DRESSING / WOUND CARE / SHOWERING  Splint is to remain in place.  No showering.  Follow-up in 2 weeks for staple removal and splint change.  ACTIVITY  o Increase activity slowly as tolerated, but follow the weight bearing instructions below.   o No driving for 6 weeks or until further direction given by your physician.  You cannot drive while taking narcotics.  o No lifting or carrying greater than 10 lbs. until further directed by your surgeon. o Avoid periods of inactivity such as sitting longer than an hour when not asleep. This helps prevent blood clots.  o You may return to work once you are authorized by your doctor.     WEIGHT BEARING  Nonweightbearing on the right with a walker   EXERCISES Ambulation with physical therapy.  CONSTIPATION  Constipation is defined medically as fewer than three stools per week and severe constipation as less than one stool per week.  Even if you have a regular bowel pattern at home, your normal regimen is likely to be  disrupted due to multiple reasons following surgery.  Combination of anesthesia, postoperative narcotics, change in appetite and fluid intake all can affect your bowels.   YOU MUST use at least one of the following options; they are listed in order of increasing strength to get the job done.  They are all available over the counter, and you may need to use some, POSSIBLY even all of these options:    Drink plenty of fluids (prune juice may be helpful) and high fiber foods Colace 100 mg by mouth twice a day  Senokot for constipation as directed and as needed Dulcolax (bisacodyl), take with full glass of water  Miralax (polyethylene glycol) once or twice a day as needed.  If you have tried all these things and are unable to have a bowel movement in the first 3-4 days after surgery call either your surgeon or your primary doctor.    If you experience loose stools or diarrhea, hold the medications until you stool forms back up.  If your symptoms do not get better within 1 week or if they get worse, check with your doctor.  If you experience "the worst abdominal pain ever" or develop nausea or vomiting, please contact the office immediately for further recommendations for treatment.   ITCHING:  If you experience itching with your medications, try taking only a single pain pill, or even half a pain pill at a time.  You  can also use Benadryl over the counter for itching or also to help with sleep.   TED HOSE STOCKINGS:  Use stockings on both legs until for at least 2 weeks or as directed by physician office. They may be removed at night for sleeping.  MEDICATIONS:  See your medication summary on the "After Visit Summary" that nursing will review with you.  You may have some home medications which will be placed on hold until you complete the course of blood thinner medication.  It is important for you to complete the blood thinner medication as prescribed.  PRECAUTIONS:  If you experience chest pain or  shortness of breath - call 911 immediately for transfer to the hospital emergency department.   If you develop a fever greater that 101 F, purulent drainage from wound, increased redness or drainage from wound, foul odor from the wound/dressing, or calf pain - CONTACT YOUR SURGEON.                                                   FOLLOW-UP APPOINTMENTS:  If you do not already have a post-op appointment, please call the office for an appointment to be seen by your surgeon.  Guidelines for how soon to be seen are listed in your "After Visit Summary", but are typically between 1-4 weeks after surgery.  OTHER INSTRUCTIONS:   Keep the leg elevated and the Polar Care or ice on the leg.  MAKE SURE YOU:  . Understand these instructions.  . Get help right away if you are not doing well or get worse.    Thank you for letting us be a part of your medical care team.  It is a privilege we respect greatly.  We hope these instructions will help you stay on track for a fast and full recovery!

## 2021-01-15 NOTE — Progress Notes (Signed)
PROGRESS NOTE    Bethany Hicks  VZC:[id number] DOB: 1928/01/28 DOA: 01/11/2021 PCP: Rusty Aus, MD   Brief Narrative:   87 y.o. female with medical history significant of hypertension, hyperlipidemia, GERD, hypothyroidism, depression, anxiety, panic attack, tobacco, atrial arrhythmia?, who presents with fall and right ankle pain.  Per her daughter at the bedside, patient fell when she was walking to the bathroom, and slipped on something wet on the floor. No LOC. She injured her right ankle, causing swelling and pain in right ankle. The pain is constant, moderate to severe, sharp, nonradiating. No leg numbness.  She also injured the back of head and developed a hematoma in the back of her head. No neck pain.  No unilateral numbness or tingling in extremities.  No facial droop or slurred speech, denies dizziness or lightheadedness.  Imaging confirmed multiple complex fractures in the right ankle and a disruption of the tibiotalar joint space.  Orthopedic consulted from admission.  Patient underwent operative fixation on 3/25.  Tolerated procedure well.  Postoperative pain minimal.  Physical therapy and Occupational Therapy recommended skilled nursing facility.  Discussed with family and they are in agreement.  TOC consult in place.  Blood pressure control improving, not at goal    Assessment & Plan:   Principal Problem:   Closed right ankle fracture Active Problems:   Hypothyroidism   HLD (hyperlipidemia)   Essential hypertension   Depression   Fall  Closed right ankle fracture  X-ray showed multiple complex fractures about the right ankle with severe disruption of the tibiotalar joint space as described above.  No neurovascular compromise.  Orthopedic surgeon, Dr. Marry Guan was consulted Status post ORIF on 3/25 Adequate postoperative pain control Plan: Multimodal pain control SQ Lovenox Continue therapy evaluations Bowel regimen while on narcotics Discharge to skilled nursing  facility, [facility] consult in place   Hypothyroidism -Synthroid  HLD (hyperlipidemia)  Not taking medications currently -Outpatient follow-up  Essential hypertension -As needed hydralazine IV -Amlodipine  Depression: -Paxil  Fall PT and OT to begin 3/26  Memory loss/dementia? -Namenda, Exelon     DVT prophylaxis: SCD Code Status: Full Family Communication: Son at bedside 3/27 Disposition Plan:Status is: Inpatient  Remains inpatient appropriate because:Inpatient level of care appropriate due to severity of illness   Dispo: The patient is from: Home              Anticipated d/c is to: SNF              Patient currently is not medically stable to d/c.   Difficult to place patient No  Postop day 2 status post ORIF for bimalleolar ankle fracture.  Therapy is recommended skilled nursing facility.  TOC consult placed.  Anticipate medical readiness in 24 hours   Level of care: Med-Surg  Consultants:   Orthopedics  Procedures:   ORIF bimalleolar ankle fracture 3/25  Antimicrobials:   None   Subjective: Patient seen and examined.  Postoperative day #2.  Pain well controlled.  Objective: Vitals:   01/14/21 2012 01/14/21 2343 01/15/21 0605 01/15/21 0738  BP: (!) 158/66 (!) 150/72 (!) 172/78 (!) 164/71  Pulse: 62 69 63 65  Resp: 16 16 16 16   Temp: 98 F (36.7 C) 98 F (36.7 C) 98.2 F (36.8 C) 97.8 F (36.6 C)  TempSrc:      SpO2: 97% 94% 93% 95%  Weight:      Height:        Intake/Output Summary (Last 24 hours) at 01/15/2021 [phone]  Last data filed at 01/15/2021 0600 Gross per 24 hour  Intake 1237.63 ml  Output 2650 ml  Net -1412.37 ml   Filed Weights   01/11/21 0628  Weight: 74.4 kg    Examination: General: No apparent distress, patient appears well HEENT: Normocephalic, atraumatic Neck, supple, trachea midline, no tenderness Heart: Regular rate and rhythm, S1/S2 normal, no murmurs Lungs: Clear to auscultation bilaterally, no  adventitious sounds, normal work of breathing Abdomen: Soft, nontender, nondistended, positive bowel sounds Extremities: Right foot in surgical dressing and boot, not removed, sensation intact, dressing CDI Neurologic: Cranial nerves grossly intact, sensation intact, alert and oriented x3 Psychiatric: Normal affect  Data Reviewed: I have personally reviewed following labs and imaging studies  CBC: Recent Labs  Lab 01/11/21 0813 01/12/21 0544 01/14/21 0759  WBC 7.6 6.6 6.9  NEUTROABS 5.7  --  5.3  HGB 12.6 10.9* 11.6*  HCT 38.2 33.8* 35.9*  MCV 91.4 91.8 92.8  PLT 175 149* 268   Basic Metabolic Panel: Recent Labs  Lab 01/11/21 0813 01/12/21 0544 01/14/21 0759  NA 140 139 139  K 3.5 3.3* 3.9  CL 105 103 106  CO2 26 29 26   GLUCOSE 95 108* 113*  BUN 20 18 15   CREATININE 0.83 0.91 0.78  CALCIUM 8.7* 8.6* 8.2*  MG  --   --  1.8   GFR: Estimated Creatinine Clearance: 47.2 mL/min (by C-G formula based on SCr of 0.78 mg/dL). Liver Function Tests: No results for input(s): AST, ALT, ALKPHOS, BILITOT, PROT, ALBUMIN in the last 168 hours. No results for input(s): LIPASE, AMYLASE in the last 168 hours. No results for input(s): AMMONIA in the last 168 hours. Coagulation Profile: Recent Labs  Lab 01/11/21 0813  INR 1.0   Cardiac Enzymes: No results for input(s): CKTOTAL, CKMB, CKMBINDEX, TROPONINI in the last 168 hours. BNP (last 3 results) No results for input(s): PROBNP in the last 8760 hours. HbA1C: No results for input(s): HGBA1C in the last 72 hours. CBG: No results for input(s): GLUCAP in the last 168 hours. Lipid Profile: No results for input(s): CHOL, HDL, LDLCALC, TRIG, CHOLHDL, LDLDIRECT in the last 72 hours. Thyroid Function Tests: No results for input(s): TSH, T4TOTAL, FREET4, T3FREE, THYROIDAB in the last 72 hours. Anemia Panel: No results for input(s): VITAMINB12, FOLATE, FERRITIN, TIBC, IRON, RETICCTPCT in the last 72 hours. Sepsis Labs: No results for  input(s): PROCALCITON, LATICACIDVEN in the last 168 hours.  Recent Results (from the past 240 hour(s))  Resp Panel by RT-PCR (Flu A&B, Covid) Nasopharyngeal Swab     Status: None   Collection Time: 01/11/21 10:41 AM   Specimen: Nasopharyngeal Swab; Nasopharyngeal(NP) swabs in vial transport medium  Result Value Ref Range Status   SARS Coronavirus 2 by RT PCR NEGATIVE NEGATIVE Final    Comment: (NOTE) SARS-CoV-2 target nucleic acids are NOT DETECTED.  The SARS-CoV-2 RNA is generally detectable in upper respiratory specimens during the acute phase of infection. The lowest concentration of SARS-CoV-2 viral copies this assay can detect is 138 copies/mL. A negative result does not preclude SARS-Cov-2 infection and should not be used as the sole basis for treatment or other patient management decisions. A negative result may occur with  improper specimen collection/handling, submission of specimen other than nasopharyngeal swab, presence of viral mutation(s) within the areas targeted by this assay, and inadequate number of viral copies(<138 copies/mL). A negative result must be combined with clinical observations, patient history, and epidemiological information. The expected result is Negative.  Fact Sheet for  Patients:  EntrepreneurPulse.com.au  Fact Sheet for Healthcare Providers:  IncredibleEmployment.be  This test is no t yet approved or cleared by the [place] FDA and  has been authorized for detection and/or diagnosis of SARS-CoV-2 by FDA under an Emergency Use Authorization (EUA). This EUA will remain  in effect (meaning this test can be used) for the duration of the COVID-19 declaration under Section 564(b)(1) of the Act, 21 U.S.C.section 360bbb-3(b)(1), unless the authorization is terminated  or revoked sooner.       Influenza A by PCR NEGATIVE NEGATIVE Final   Influenza B by PCR NEGATIVE NEGATIVE Final    Comment: (NOTE) The  Xpert Xpress SARS-CoV-2/FLU/RSV plus assay is intended as an aid in the diagnosis of influenza from Nasopharyngeal swab specimens and should not be used as a sole basis for treatment. Nasal washings and aspirates are unacceptable for Xpert Xpress SARS-CoV-2/FLU/RSV testing.  Fact Sheet for Patients: EntrepreneurPulse.com.au  Fact Sheet for Healthcare Providers: IncredibleEmployment.be  This test is not yet approved or cleared by the [place] FDA and has been authorized for detection and/or diagnosis of SARS-CoV-2 by FDA under an Emergency Use Authorization (EUA). This EUA will remain in effect (meaning this test can be used) for the duration of the COVID-19 declaration under Section 564(b)(1) of the Act, 21 U.S.C. section 360bbb-3(b)(1), unless the authorization is terminated or revoked.  Performed at [facility], [address], [place],  [address]          Radiology Studies: DG MINI C-ARM IMAGE ONLY  Result Date: 01/13/2021 There is no interpretation for this exam.  This order is for images obtained during a surgical procedure.  Please See "Surgeries" Tab for more information regarding the procedure.        Scheduled Meds: . amLODipine  10 mg Oral Daily  . Chlorhexidine Gluconate Cloth  6 each Topical Daily  . cholecalciferol  1,000 Units Oral Daily  . cyanocobalamin  1,000 mcg Intramuscular Q30 days  . enoxaparin (LOVENOX) injection  40 mg Subcutaneous Q24H  . ferrous sulfate  325 mg Oral BID WC  . levothyroxine  88 mcg Oral q morning  . memantine  5 mg Oral BID  . multivitamin with minerals  1 tablet Oral Daily  . pantoprazole  40 mg Oral Daily  . PARoxetine  10 mg Oral BID  . rivastigmine  3 mg Oral BID  . senna-docusate  1 tablet Oral BID  . sodium chloride  1 g Oral TID   Continuous Infusions:    LOS: 4 days    Time spent: 15 minutes    Sidney Ace, MD [facility] Pager  [phone]  If 7PM-7AM, please contact night-coverage 01/15/2021, 9:42 AM

## 2021-01-15 NOTE — TOC Initial Note (Signed)
Transition of Care [facility]) - Initial/Assessment Note    Patient Details  Name: Bethany Hicks MRN: [id number] Date of Birth: 1928/01/02  [facility] CM/SW Contact:    Boris Sharper, LCSW Phone Number: 01/15/2021, 1:02 PM  Clinical Narrative:                 CSW spoke with pt's daughter Bethany Hicks regarding discharge plan and PT/OT recommendations. Pt's daughter stated that pt was living at home alone before her fall with the support of he and pt's other daughter, Bethany Hicks hopes that pt can return  once she leaves rehab. Bethany Hicks was in agreement with the plan for pt to go to rehab and prefers for pt to [facility]. CSW explained the possible barriers for [facility] and she was agreeable for referral to be sent out to other facilities as well. CSW completed FL2 and PASRR and faxed to surrounding facilities.   Expected Discharge Plan: Skilled Nursing Facility Barriers to Discharge: Continued Medical Work up,SNF Pending bed offer   Patient Goals and CMS Choice Patient states their goals for this hospitalization and ongoing recovery are:: [place] states for pt to get back home after rehab CMS Medicare.gov Compare Post Acute Care list provided to:: Patient Represenative (must comment) Choice offered to / list presented to : Adult Children  Expected Discharge Plan and Services Expected Discharge Plan: [place] Acute Care Choice: [place] arrangements for the past 2 months: Single Family Home                                      Prior Living Arrangements/Services Living arrangements for the past 2 months: Single Family Home Lives with:: Self Patient language and need for interpreter reviewed:: Yes        Need for Family Participation in Patient Care: Yes (Comment) Care giver support system in place?: Yes (comment) (adult children)   Criminal Activity/Legal Involvement Pertinent to Current Situation/Hospitalization: No  - Comment as needed  Activities of Daily Living Home Assistive Devices/Equipment: Cane (specify quad or straight),Eyeglasses,Hearing aid ADL Screening (condition at time of admission) Patient's cognitive ability adequate to safely complete daily activities?: Yes Is the patient deaf or have difficulty hearing?: Yes Does the patient have difficulty seeing, even when wearing glasses/contacts?: No Does the patient have difficulty concentrating, remembering, or making decisions?: Yes Patient able to express need for assistance with ADLs?: Yes Does the patient have difficulty dressing or bathing?: No Independently performs ADLs?: Yes (appropriate for developmental age) Does the patient have difficulty walking or climbing stairs?: Yes Weakness of Legs: Both Weakness of Arms/Hands: None  Permission Sought/Granted Permission sought to share information with : Facility Art therapist granted to share information with : Yes, Verbal Permission Granted  Share Information with NAME: daughters     Permission granted to share info w Relationship: daughters     Emotional Assessment Appearance:: Other (Comment Required (unable to assess) Attitude/Demeanor/Rapport: Unable to Assess Affect (typically observed): Unable to Assess Orientation: : Oriented to Self,Oriented to Place,Oriented to Situation Alcohol / Substance Use: Not Applicable Psych Involvement: No (comment)  Admission diagnosis:  Closed right ankle fracture [S82.891A] Closed fracture of right ankle, initial encounter [S82.891A] Patient Active Problem List   Diagnosis Date Noted  . Closed right ankle fracture 01/11/2021  . Depression 01/11/2021  . Fall 01/11/2021  . HLD (  hyperlipidemia) 07/21/2007  . ANXIETY 07/21/2007  . TOBACCO ABUSE 07/21/2007  . Essential hypertension 07/21/2007  . ALLERGIC RHINITIS 07/21/2007  . GERD 07/21/2007  . DIVERTICULOSIS, COLON 07/21/2007  . OSTEOPENIA 07/21/2007  . ATRIAL  ARRHYTHMIAS 07/04/2007  . SKIN LESION 07/04/2007  . Hypothyroidism 03/20/2007  . HYPERCHOLESTEROLEMIA, PURE 03/20/2007   PCP:  Rusty Aus, MD Pharmacy:   [facility] [address], [place] - [place] [address] [place] [address] Phone: [phone] Fax: [phone]     Social Determinants of Health (SDOH) Interventions    Readmission Risk Interventions No flowsheet data found.

## 2021-01-15 NOTE — Progress Notes (Signed)
  Subjective: 2 Days Post-Op Procedure(s) (LRB): OPEN REDUCTION INTERNAL FIXATION (ORIF) ANKLE FRACTURE-Bimalleolar (Right) Patient reports pain as mild.   Patient is well, and has had no acute complaints or problems Plan is to go Home after hospital stay. Negative for chest pain and shortness of breath Fever: no Gastrointestinal: Negative for nausea and vomiting  Objective: Vital signs in last 24 hours: Temp:  [97.5 F (36.4 C)-98.2 F (36.8 C)] 98.2 F (36.8 C) (03/27 0605) Pulse Rate:  [62-73] 63 (03/27 0605) Resp:  [16-18] 16 (03/27 0605) BP: (150-177)/(65-89) 172/78 (03/27 0605) SpO2:  [93 %-98 %] 93 % (03/27 0605)  Intake/Output from previous day:  Intake/Output Summary (Last 24 hours) at 01/15/2021 0638 Last data filed at 01/15/2021 0600 Gross per 24 hour  Intake 1237.63 ml  Output 2650 ml  Net -1412.37 ml    Intake/Output this shift: Total I/O In: 997.6 [I.V.:997.6] Out: 1850 [Urine:1850]  Labs: Recent Labs    01/14/21 0759  HGB 11.6*   Recent Labs    01/14/21 0759  WBC 6.9  RBC 3.87  HCT 35.9*  PLT 165   Recent Labs    01/14/21 0759  NA 139  K 3.9  CL 106  CO2 26  BUN 15  CREATININE 0.78  GLUCOSE 113*  CALCIUM 8.2*   No results for input(s): LABPT, INR in the last 72 hours.   EXAM General - Patient is Alert and Oriented Extremity - Neurovascular intact Sensation intact distally Dressing/Incision - clean, dry, with the splint intact Motor Function - intact, moving foot and toes well on exam.  Transitioned to the chair yesterday.  Limited ambulation.  Past Medical History:  Diagnosis Date  . Anxiety   . Fluid collection (edema) in the arms, legs, hands and feet   . History of panic attacks   . Hypertension   . Hypothyroidism     Assessment/Plan: 2 Days Post-Op Procedure(s) (LRB): OPEN REDUCTION INTERNAL FIXATION (ORIF) ANKLE FRACTURE-Bimalleolar (Right) Principal Problem:   Closed right ankle fracture Active Problems:    Hypothyroidism   HLD (hyperlipidemia)   Essential hypertension   Depression   Fall  Estimated body mass index is 27.29 kg/m as calculated from the following:   Height as of this encounter: 5\' 5"  (1.651 m).   Weight as of this encounter: 74.4 kg. Advance diet Up with therapy D/C IV fluids   Discharge planning with care management.  Plan to discharge home versus rehab when safe.  Follow-up at [facility] clinic orthopedics in 2 weeks for staple removal and x-rays with Dr. Marry Guan.  DVT Prophylaxis - Lovenox None Weight-Bearing to right leg  Reche Dixon, PA-C Orthopaedic Surgery 01/15/2021, 6:38 AM

## 2021-01-16 ENCOUNTER — Encounter: Payer: Self-pay | Admitting: Orthopedic Surgery

## 2021-01-16 LAB — SARS CORONAVIRUS 2 (TAT 6-24 HRS): SARS Coronavirus 2: NEGATIVE

## 2021-01-16 MED ORDER — SODIUM CHLORIDE 0.9% FLUSH
10.0000 mL | Freq: Two times a day (BID) | INTRAVENOUS | Status: DC
  Administered 2021-01-16: 10 mL via INTRAVENOUS

## 2021-01-16 NOTE — TOC Progression Note (Signed)
Transition of Care [facility]) - Progression Note    Patient Details  Name: Bethany Hicks MRN: [id number] Date of Birth: [birth year]  Transition of Care [facility]) CM/SW [place], RN Phone Number: 01/16/2021, 2:57 PM  Clinical Narrative:   CM spoke ti the daughter Bethany Hicks and reviewed the bed offers She requested time to call her siblings and discuss before making a choice, she agreed to provide choice by end of day today, She did ask if they did not want either facility what their other options are, CM explained that they could decline however that would mean them taking her home and they could get home health, however that is only a couple of times per week, she stated that she would call back with a choice    Expected Discharge Plan: Skilled Nursing Facility Barriers to Discharge: Continued Medical Work up,SNF Pending bed offer  Expected Discharge Plan and Services Expected Discharge Plan: [employer] Choice: [place] arrangements for the past 2 months: Single Family Home                                       Social Determinants of Health (SDOH) Interventions    Readmission Risk Interventions No flowsheet data found.

## 2021-01-16 NOTE — Progress Notes (Signed)
Physical Therapy Treatment Patient Details Name: Bethany Hicks MRN: [id number] DOB: 04-Aug-1928 Today's Date: 01/16/2021    History of Present Illness 87 y.o. female with medical history significant of hypertension, hyperlipidemia, GERD, hypothyroidism, depression, anxiety, panic attack, tobacco, atrial arrhythmia?, who presents with fall and right ankle pain, found to be fractured.   s/p R ankle ORIF 3/25.    PT Comments    Pt sat up in chair x 3 hours today, currently fatigued and requesting to return to bed. Min/ModA to raise to standing at RW while maintaining NWB R LE.  MinA to gain initial balance.  Pt hopped to side of bed and safely turned prior to sitting with good safety awareness. Assist given to raise R LE up onto bed, reviewed supine strengthening exercises for B LE's, positioned pt to comfort, B LE elevated, call bell in reach and sittier in room.  Pt is progressing well despite weight bearing limitations.  She remains very motivated and will continue to benefit from short term rehab at SNF prior to returning home.   Follow Up Recommendations  SNF     Equipment Recommendations  Rolling walker with 5" wheels    Recommendations for Other Services       Precautions / Restrictions Precautions Precautions: Fall Restrictions Weight Bearing Restrictions: Yes RLE Weight Bearing: Non weight bearing    Mobility  Bed Mobility Overal bed mobility: Needs Assistance Bed Mobility: Sit to Supine     Supine to sit: Min guard     General bed mobility comments: Pt needed only very light cuing/assist to get supine to sit    Transfers Overall transfer level: Needs assistance Equipment used: Rolling walker (2 wheeled) Transfers: Sit to/from Stand Sit to Stand: Min assist;Mod assist Stand pivot transfers: Min assist       General transfer comment: Min/ModA to raise from recliner after sitting up for 3 hours.  Ambulation/Gait Ambulation/Gait assistance: Modified independent  (Device/Increase time) Gait Distance (Feet): 3 Feet Assistive device: Rolling walker (2 wheeled)       General Gait Details: vc's to maintain NWB due to dementia   Stairs             Wheelchair Mobility    Modified Rankin (Stroke Patients Only)       Balance                                            Cognition Arousal/Alertness: Awake/alert Behavior During Therapy: WFL for tasks assessed/performed Overall Cognitive Status: History of cognitive impairments - at baseline                                 General Comments: Pt very pleasant and eager to work with PT      Exercises General Exercises - Upper Extremity Chair Push Up: Strengthening;20 reps;Seated General Exercises - Lower Extremity Ankle Circles/Pumps: AROM;Left;20 reps Long Arc Quad: AROM;Both;10 reps;Seated Heel Slides: AROM;10 reps Hip ABduction/ADduction: Strengthening;10 reps Hip Flexion/Marching: AROM;Both;10 reps;Seated    General Comments        Pertinent Vitals/Pain Pain Assessment: No/denies pain    Home Living                      Prior Function            PT Goals (  current goals can now be found in the care plan section) Acute Rehab PT Goals Patient Stated Goal: Go to rehab for a while and return home    Frequency    BID      PT Plan Current plan remains appropriate    Co-evaluation              AM-PAC PT "6 Clicks" Mobility   Outcome Measure  Help needed turning from your back to your side while in a flat bed without using bedrails?: A Little Help needed moving from lying on your back to sitting on the side of a flat bed without using bedrails?: A Little Help needed moving to and from a bed to a chair (including a wheelchair)?: A Lot Help needed standing up from a chair using your arms (e.g., wheelchair or bedside chair)?: A Lot Help needed to walk in hospital room?: Total Help needed climbing 3-5 steps with a  railing? : Total 6 Click Score: 12    End of Session Equipment Utilized During Treatment: Gait belt Activity Tolerance: Patient tolerated treatment well;Patient limited by fatigue Patient left: in bed;with call bell/phone within reach;with family/visitor present;with SCD's reapplied Nurse Communication: Mobility status PT Visit Diagnosis: Muscle weakness (generalized) (M62.81);Difficulty in walking, not elsewhere classified (R26.2)     Time: 1540-1600 PT Time Calculation (min) (ACUTE ONLY): 20 min  Charges:  $Gait Training: 8-22 mins $Therapeutic Exercise: 23-37 mins $Therapeutic Activity: 8-22 mins                     Mikel Cella, PTA   [name] 01/16/2021, 4:11 PM

## 2021-01-16 NOTE — Progress Notes (Signed)
  Subjective: 3 Days Post-Op Procedure(s) (LRB): OPEN REDUCTION INTERNAL FIXATION (ORIF) ANKLE FRACTURE-Bimalleolar (Right) Patient reports pain as well-controlled.  [place] caretaker at bedside.  Patient is well, and has had no acute complaints or problems Plan is to go Skilled nursing facility after hospital stay. Negative for chest pain and shortness of breath Fever: no Gastrointestinal: negative for nausea and vomiting.   Patient has had a bowel movement.  Objective: Vital signs in last 24 hours: Temp:  [97.7 F (36.5 C)-98.8 F (37.1 C)] 98.2 F (36.8 C) (03/28 1208) Pulse Rate:  [68-80] 78 (03/28 1208) Resp:  [16-20] 20 (03/28 1208) BP: (147-166)/(68-83) 151/83 (03/28 1208) SpO2:  [92 %-95 %] 94 % (03/28 1208)  Intake/Output from previous day:  Intake/Output Summary (Last 24 hours) at 01/16/2021 1423 Last data filed at 01/16/2021 0859 Gross per 24 hour  Intake 240 ml  Output 1200 ml  Net -960 ml    Intake/Output this shift: Total I/O In: -  Out: 800 [Urine:800]  Labs: Recent Labs    01/14/21 0759  HGB 11.6*   Recent Labs    01/14/21 0759  WBC 6.9  RBC 3.87  HCT 35.9*  PLT 165   Recent Labs    01/14/21 0759  NA 139  K 3.9  CL 106  CO2 26  BUN 15  CREATININE 0.78  GLUCOSE 113*  CALCIUM 8.2*   No results for input(s): LABPT, INR in the last 72 hours.   EXAM General - Patient is Alert, Appropriate and Oriented Extremity - able to flex and extend toes, sensation intact over exposed foot  Dressing/Incision -RLE post-op dressing in place, Polar Care in place and working Motor Function - intact, moving  toes well on exam.  Cardiovascular- Regular rate and rhythm, no murmurs/rubs/gallops Respiratory- Lungs clear to auscultation bilaterally Gastrointestinal- soft, nontender and active bowel sounds   Assessment/Plan: 3 Days Post-Op Procedure(s) (LRB): OPEN REDUCTION INTERNAL FIXATION (ORIF) ANKLE FRACTURE-Bimalleolar (Right) Principal Problem:    Closed right ankle fracture Active Problems:   Hypothyroidism   HLD (hyperlipidemia)   Essential hypertension   Depression   Fall  Estimated body mass index is 27.29 kg/m as calculated from the following:   Height as of this encounter: 5\' 5"  (1.651 m).   Weight as of this encounter: 74.4 kg. Advance diet Up with therapy Discharge to SNF pending SNF acceptance. Order placed for STAT Covid test.    DVT Prophylaxis - Lovenox, Ted hose and SCDs NonWB to RLE.  Cassell Smiles, PA-C [facility] Orthopaedic Surgery 01/16/2021, 2:23 PM

## 2021-01-16 NOTE — Care Management Important Message (Signed)
Important Message  Patient Details  Name: CODY OLIGER MRN: [id number] Date of Birth: [birth year]   Medicare Important Message Given:  Yes     [name] A Macrae Wiegman 01/16/2021, 11:14 AM

## 2021-01-16 NOTE — Progress Notes (Signed)
PROGRESS NOTE    Bethany Hicks  LOV:[id number] DOB: [birth year] DOA: 01/11/2021 PCP: Rusty Aus, MD   Brief Narrative:   87 y.o. female with medical history significant of hypertension, hyperlipidemia, GERD, hypothyroidism, depression, anxiety, panic attack, tobacco, atrial arrhythmia?, who presents with fall and right ankle pain.  Per her daughter at the bedside, patient fell when she was walking to the bathroom, and slipped on something wet on the floor. No LOC. She injured her right ankle, causing swelling and pain in right ankle. The pain is constant, moderate to severe, sharp, nonradiating. No leg numbness.  She also injured the back of head and developed a hematoma in the back of her head. No neck pain.  No unilateral numbness or tingling in extremities.  No facial droop or slurred speech, denies dizziness or lightheadedness.  Imaging confirmed multiple complex fractures in the right ankle and a disruption of the tibiotalar joint space.  Orthopedic consulted from admission.  Patient underwent operative fixation on 3/25.  Tolerated procedure well.  Postoperative pain minimal.  Physical therapy and Occupational Therapy recommended skilled nursing facility.  Discussed with family and they are in agreement.  TOC consult in place.  Blood pressure control improving.    Assessment & Plan:   Principal Problem:   Closed right ankle fracture Active Problems:   Hypothyroidism   HLD (hyperlipidemia)   Essential hypertension   Depression   Fall  Closed right ankle fracture  X-ray showed multiple complex fractures about the right ankle with severe disruption of the tibiotalar joint space as described above.  No neurovascular compromise.  Orthopedic surgeon, Dr. Marry Guan was consulted Status post ORIF on 3/25 Postoperative pain well controlled Plan: Multimodal pain control SQ Lovenox Continue therapy evaluations Bowel regimen while on narcotics Patient stable for discharge to skilled  nursing facility [facility] consult in place   Hypothyroidism -Synthroid  HLD (hyperlipidemia)  Not taking medications currently -Outpatient follow-up  Essential hypertension -As needed hydralazine IV -Amlodipine  Depression: -Paxil  Fall PT and OT to begin 3/26  Memory loss/dementia? -Namenda, Ex[place]     DVT prophylaxis: SQ Lovenox Code Status: Full Family Communication: Son at bedside 3/27 Disposition Plan:Status is: Inpatient  Status is: Inpatient  Remains inpatient appropriate because:Unsafe d/c plan   Dispo: The patient is from: Home              Anticipated d/c is to: SNF              Patient currently is medically stable to d/c.   Difficult to place patient No   Medically stable for discharge at this time.  Pending placement         Level of care: Med-Surg  Consultants:   Orthopedics  Procedures:   ORIF bimalleolar ankle fracture 3/25  Antimicrobials:   None   Subjective: Patient seen and examined.  In good spirits.  Postoperative pain well controlled.  Medically stable for discharge.  Objective: Vitals:   01/15/21 1515 01/15/21 2012 01/16/21 0400 01/16/21 0745  BP: (!) 150/77 (!) 147/68 (!) 148/78 (!) 166/83  Pulse: 80 73 68 71  Resp: 18 18 16 20   Temp: 98.8 F (37.1 C) 97.7 F (36.5 C) 98.3 F (36.8 C) 97.9 F (36.6 C)  TempSrc:  Oral  Oral  SpO2: 94% 95% 93% 92%  Weight:      Height:        Intake/Output Summary (Last 24 hours) at 01/16/2021 1119 Last data filed at 01/16/2021 0859 Gross per  24 hour  Intake 240 ml  Output 1200 ml  Net -960 ml   Filed Weights   01/11/21 0628  Weight: 74.4 kg    Examination: General: No apparent distress, patient appears well HEENT: Normocephalic, atraumatic Neck, supple, trachea midline, no tenderness Heart: Regular rate and rhythm, S1/S2 normal, no murmurs Lungs: Clear to auscultation bilaterally, no adventitious sounds, normal work of breathing Abdomen: Soft, nontender,  nondistended, positive bowel sounds Extremities: Right foot in surgical dressing and boot, not removed, sensation intact, dressing CDI Neurologic: Cranial nerves grossly intact, sensation intact, alert and oriented x3 Psychiatric: Normal affect  Data Reviewed: I have personally reviewed following labs and imaging studies  CBC: Recent Labs  Lab 01/11/21 0813 01/12/21 0544 01/14/21 0759  WBC 7.6 6.6 6.9  NEUTROABS 5.7  --  5.3  HGB 12.6 10.9* 11.6*  HCT 38.2 33.8* 35.9*  MCV 91.4 91.8 92.8  PLT 175 149* 885   Basic Metabolic Panel: Recent Labs  Lab 01/11/21 0813 01/12/21 0544 01/14/21 0759  NA 140 139 139  K 3.5 3.3* 3.9  CL 105 103 106  CO2 26 29 26   GLUCOSE 95 108* 113*  BUN 20 18 15   CREATININE 0.83 0.91 0.78  CALCIUM 8.7* 8.6* 8.2*  MG  --   --  1.8   GFR: Estimated Creatinine Clearance: 47.2 mL/min (by C-G formula based on SCr of 0.78 mg/dL). Liver Function Tests: No results for input(s): AST, ALT, ALKPHOS, BILITOT, PROT, ALBUMIN in the last 168 hours. No results for input(s): LIPASE, AMYLASE in the last 168 hours. No results for input(s): AMMONIA in the last 168 hours. Coagulation Profile: Recent Labs  Lab 01/11/21 0813  INR 1.0   Cardiac Enzymes: No results for input(s): CKTOTAL, CKMB, CKMBINDEX, TROPONINI in the last 168 hours. BNP (last 3 results) No results for input(s): PROBNP in the last 8760 hours. HbA1C: No results for input(s): HGBA1C in the last 72 hours. CBG: No results for input(s): GLUCAP in the last 168 hours. Lipid Profile: No results for input(s): CHOL, HDL, LDLCALC, TRIG, CHOLHDL, LDLDIRECT in the last 72 hours. Thyroid Function Tests: No results for input(s): TSH, T4TOTAL, FREET4, T3FREE, THYROIDAB in the last 72 hours. Anemia Panel: No results for input(s): VITAMINB12, FOLATE, FERRITIN, TIBC, IRON, RETICCTPCT in the last 72 hours. Sepsis Labs: No results for input(s): PROCALCITON, LATICACIDVEN in the last 168 hours.  Recent  Results (from the past 240 hour(s))  Resp Panel by RT-PCR (Flu A&B, Covid) Nasopharyngeal Swab     Status: None   Collection Time: 01/11/21 10:41 AM   Specimen: Nasopharyngeal Swab; Nasopharyngeal(NP) swabs in vial transport medium  Result Value Ref Range Status   SARS Coronavirus 2 by RT PCR NEGATIVE NEGATIVE Final    Comment: (NOTE) SARS-CoV-2 target nucleic acids are NOT DETECTED.  The SARS-CoV-2 RNA is generally detectable in upper respiratory specimens during the acute phase of infection. The lowest concentration of SARS-CoV-2 viral copies this assay can detect is 138 copies/mL. A negative result does not preclude SARS-Cov-2 infection and should not be used as the sole basis for treatment or other patient management decisions. A negative result may occur with  improper specimen collection/handling, submission of specimen other than nasopharyngeal swab, presence of viral mutation(s) within the areas targeted by this assay, and inadequate number of viral copies(<138 copies/mL). A negative result must be combined with clinical observations, patient history, and epidemiological information. The expected result is Negative.  Fact Sheet for Patients:  EntrepreneurPulse.com.au  Fact Sheet for Healthcare  Providers:  IncredibleEmployment.be  This test is no t yet approved or cleared by the [place] and  has been authorized for detection and/or diagnosis of SARS-CoV-2 by FDA under an Emergency Use Authorization (EUA). This EUA will remain  in effect (meaning this test can be used) for the duration of the COVID-19 declaration under Section 564(b)(1) of the Act, 21 U.S.C.section 360bbb-3(b)(1), unless the authorization is terminated  or revoked sooner.       Influenza A by PCR NEGATIVE NEGATIVE Final   Influenza B by PCR NEGATIVE NEGATIVE Final    Comment: (NOTE) The Xpert Xpress SARS-CoV-2/FLU/RSV plus assay is intended as an aid in the  diagnosis of influenza from Nasopharyngeal swab specimens and should not be used as a sole basis for treatment. Nasal washings and aspirates are unacceptable for Xpert Xpress SARS-CoV-2/FLU/RSV testing.  Fact Sheet for Patients: EntrepreneurPulse.com.au  Fact Sheet for Healthcare Providers: IncredibleEmployment.be  This test is not yet approved or cleared by the [place] FDA and has been authorized for detection and/or diagnosis of SARS-CoV-2 by FDA under an Emergency Use Authorization (EUA). This EUA will remain in effect (meaning this test can be used) for the duration of the COVID-19 declaration under Section 564(b)(1) of the Act, 21 U.S.C. section 360bbb-3(b)(1), unless the authorization is terminated or revoked.  Performed at [facility], [address], [place], [place] [address]          Radiology Studies: No results found.      Scheduled Meds: . amLODipine  10 mg Oral Daily  . Chlorhexidine Gluconate Cloth  6 each Topical Daily  . cholecalciferol  1,000 Units Oral Daily  . cyanocobalamin  1,000 mcg Intramuscular Q30 days  . enoxaparin (LOVENOX) injection  40 mg Subcutaneous Q24H  . ferrous sulfate  325 mg Oral BID WC  . levothyroxine  88 mcg Oral q morning  . memantine  5 mg Oral BID  . multivitamin with minerals  1 tablet Oral Daily  . pantoprazole  40 mg Oral Daily  . PARoxetine  10 mg Oral BID  . rivastigmine  3 mg Oral BID  . senna-docusate  1 tablet Oral BID  . sodium chloride  1 g Oral TID   Continuous Infusions:    LOS: 5 days    Time spent: 15 minutes    Sidney Ace, MD [facility] Pager [phone]  If 7PM-7AM, please contact night-coverage 01/16/2021, 11:19 AM

## 2021-01-16 NOTE — Progress Notes (Signed)
Physical Therapy Treatment Patient Details Name: Bethany Hicks MRN: [id number] DOB: 12/24/1927 Today's Date: 01/16/2021    History of Present Illness 87 y.o. female with medical history significant of hypertension, hyperlipidemia, GERD, hypothyroidism, depression, anxiety, panic attack, tobacco, atrial arrhythmia?, who presents with fall and right ankle pain, found to be fractured.   s/p R ankle ORIF 3/25.    PT Comments    Excellent tolerance for mobility this am.  Pt able to perform bed mobility with SBA and vc's for technique. Sit to stand transfers from raised bed with CGA, Min/Mod from recliner chair.  Pt increased gait distance to 42ft with RW while maintaining NWB R LE.  Pt assisted to bedside recliner, LE's elevated, core pack on, call bell in reach and sitter in room.  Continue PT in pm. Pt responding well to mobility, no c/o pain throughout session.   Follow Up Recommendations  SNF     Equipment Recommendations  Rolling walker with 5" wheels    Recommendations for Other Services       Precautions / Restrictions Precautions Precautions: Fall Restrictions Weight Bearing Restrictions: Yes RLE Weight Bearing: Non weight bearing    Mobility  Bed Mobility Overal bed mobility: Needs Assistance Bed Mobility: Supine to Sit     Supine to sit: Min guard     General bed mobility comments: Pt needed only very light cuing/assist to get supine to sit    Transfers Overall transfer level: Needs assistance Equipment used: Rolling walker (2 wheeled) Transfers: Sit to/from Stand Sit to Stand: Min assist Stand pivot transfers: Min assist       General transfer comment: Repeated vc's for weight bearing restrictions due to short term memory loss  Ambulation/Gait Ambulation/Gait assistance: Modified independent (Device/Increase time) Gait Distance (Feet): 15 Feet Assistive device: Rolling walker (2 wheeled)       General Gait Details: Pt showed great effort with  hopping/ambulation.  She was able to maintain R NWBing surprisingly well, but did fatigue quickly with prolonged time in standing.   Stairs             Wheelchair Mobility    Modified Rankin (Stroke Patients Only)       Balance                                            Cognition Arousal/Alertness: Awake/alert Behavior During Therapy: WFL for tasks assessed/performed Overall Cognitive Status: History of cognitive impairments - at baseline                                 General Comments: Pt very pleasant and eager to work with PT      Exercises General Exercises - Upper Extremity Chair Push Up: Strengthening;20 reps;Seated General Exercises - Lower Extremity Ankle Circles/Pumps: AROM;Left;20 reps Long Arc Quad: AROM;Both;10 reps;Seated Hip Flexion/Marching: AROM;Both;10 reps;Seated    General Comments        Pertinent Vitals/Pain Pain Assessment: No/denies pain    Home Living                      Prior Function            PT Goals (current goals can now be found in the care plan section) Acute Rehab PT Goals Patient Stated Goal: Go to rehab for  a while and return home    Frequency    BID      PT Plan Current plan remains appropriate    Co-evaluation              AM-PAC PT "6 Clicks" Mobility   Outcome Measure  Help needed turning from your back to your side while in a flat bed without using bedrails?: A Little Help needed moving from lying on your back to sitting on the side of a flat bed without using bedrails?: A Little Help needed moving to and from a bed to a chair (including a wheelchair)?: A Lot Help needed standing up from a chair using your arms (e.g., wheelchair or bedside chair)?: A Lot Help needed to walk in hospital room?: Total Help needed climbing 3-5 steps with a railing? : Total 6 Click Score: 12    End of Session Equipment Utilized During Treatment: Gait belt Activity  Tolerance: Patient tolerated treatment well;Patient limited by fatigue Patient left: with call bell/phone within reach;with chair alarm set;with family/visitor present Nurse Communication: Mobility status PT Visit Diagnosis: Muscle weakness (generalized) (M62.81);Difficulty in walking, not elsewhere classified (R26.2)     Time: 3361-2244 PT Time Calculation (min) (ACUTE ONLY): 40 min  Charges:  $Gait Training: 8-22 mins $Therapeutic Exercise: 23-37 mins                     Bethany Hicks, PTA  Bethany Hicks 01/16/2021, 4:02 PM

## 2021-01-17 DIAGNOSIS — Z86718 Personal history of other venous thrombosis and embolism: Secondary | ICD-10-CM | POA: Diagnosis not present

## 2021-01-17 DIAGNOSIS — Z8781 Personal history of (healed) traumatic fracture: Secondary | ICD-10-CM | POA: Diagnosis not present

## 2021-01-17 DIAGNOSIS — Z9889 Other specified postprocedural states: Secondary | ICD-10-CM | POA: Diagnosis not present

## 2021-01-17 DIAGNOSIS — S8251XA Displaced fracture of medial malleolus of right tibia, initial encounter for closed fracture: Secondary | ICD-10-CM | POA: Diagnosis not present

## 2021-01-17 DIAGNOSIS — Z96698 Presence of other orthopedic joint implants: Secondary | ICD-10-CM | POA: Diagnosis not present

## 2021-01-17 DIAGNOSIS — Z7401 Bed confinement status: Secondary | ICD-10-CM | POA: Diagnosis not present

## 2021-01-17 DIAGNOSIS — K219 Gastro-esophageal reflux disease without esophagitis: Secondary | ICD-10-CM | POA: Diagnosis not present

## 2021-01-17 DIAGNOSIS — M6281 Muscle weakness (generalized): Secondary | ICD-10-CM | POA: Diagnosis not present

## 2021-01-17 DIAGNOSIS — D519 Vitamin B12 deficiency anemia, unspecified: Secondary | ICD-10-CM | POA: Diagnosis not present

## 2021-01-17 DIAGNOSIS — W19XXXA Unspecified fall, initial encounter: Secondary | ICD-10-CM | POA: Diagnosis not present

## 2021-01-17 DIAGNOSIS — S0003XA Contusion of scalp, initial encounter: Secondary | ICD-10-CM | POA: Diagnosis not present

## 2021-01-17 DIAGNOSIS — Y9389 Activity, other specified: Secondary | ICD-10-CM | POA: Diagnosis not present

## 2021-01-17 DIAGNOSIS — R41841 Cognitive communication deficit: Secondary | ICD-10-CM | POA: Diagnosis not present

## 2021-01-17 DIAGNOSIS — E569 Vitamin deficiency, unspecified: Secondary | ICD-10-CM | POA: Diagnosis not present

## 2021-01-17 DIAGNOSIS — E785 Hyperlipidemia, unspecified: Secondary | ICD-10-CM | POA: Diagnosis not present

## 2021-01-17 DIAGNOSIS — Z7189 Other specified counseling: Secondary | ICD-10-CM | POA: Diagnosis not present

## 2021-01-17 DIAGNOSIS — S82891A Other fracture of right lower leg, initial encounter for closed fracture: Secondary | ICD-10-CM | POA: Diagnosis not present

## 2021-01-17 DIAGNOSIS — Z736 Limitation of activities due to disability: Secondary | ICD-10-CM | POA: Diagnosis not present

## 2021-01-17 DIAGNOSIS — R2681 Unsteadiness on feet: Secondary | ICD-10-CM | POA: Diagnosis not present

## 2021-01-17 DIAGNOSIS — M2550 Pain in unspecified joint: Secondary | ICD-10-CM | POA: Diagnosis not present

## 2021-01-17 DIAGNOSIS — Z87891 Personal history of nicotine dependence: Secondary | ICD-10-CM | POA: Diagnosis not present

## 2021-01-17 DIAGNOSIS — E876 Hypokalemia: Secondary | ICD-10-CM | POA: Diagnosis not present

## 2021-01-17 DIAGNOSIS — I10 Essential (primary) hypertension: Secondary | ICD-10-CM | POA: Diagnosis not present

## 2021-01-17 DIAGNOSIS — R279 Unspecified lack of coordination: Secondary | ICD-10-CM | POA: Diagnosis not present

## 2021-01-17 DIAGNOSIS — M84471D Pathological fracture, right ankle, subsequent encounter for fracture with routine healing: Secondary | ICD-10-CM | POA: Diagnosis not present

## 2021-01-17 DIAGNOSIS — S82891D Other fracture of right lower leg, subsequent encounter for closed fracture with routine healing: Secondary | ICD-10-CM | POA: Diagnosis not present

## 2021-01-17 DIAGNOSIS — Z79899 Other long term (current) drug therapy: Secondary | ICD-10-CM | POA: Diagnosis not present

## 2021-01-17 DIAGNOSIS — Y92129 Unspecified place in nursing home as the place of occurrence of the external cause: Secondary | ICD-10-CM | POA: Diagnosis not present

## 2021-01-17 DIAGNOSIS — F0390 Unspecified dementia without behavioral disturbance: Secondary | ICD-10-CM | POA: Diagnosis not present

## 2021-01-17 DIAGNOSIS — R5381 Other malaise: Secondary | ICD-10-CM | POA: Diagnosis not present

## 2021-01-17 DIAGNOSIS — S82831A Other fracture of upper and lower end of right fibula, initial encounter for closed fracture: Secondary | ICD-10-CM | POA: Diagnosis not present

## 2021-01-17 DIAGNOSIS — E039 Hypothyroidism, unspecified: Secondary | ICD-10-CM | POA: Diagnosis not present

## 2021-01-17 DIAGNOSIS — M79661 Pain in right lower leg: Secondary | ICD-10-CM | POA: Diagnosis not present

## 2021-01-17 DIAGNOSIS — E871 Hypo-osmolality and hyponatremia: Secondary | ICD-10-CM | POA: Diagnosis not present

## 2021-01-17 DIAGNOSIS — S0990XA Unspecified injury of head, initial encounter: Secondary | ICD-10-CM | POA: Diagnosis not present

## 2021-01-17 MED ORDER — SENNOSIDES-DOCUSATE SODIUM 8.6-50 MG PO TABS: 1.0000 | ORAL_TABLET | Freq: Two times a day (BID) | ORAL | Status: DC

## 2021-01-17 MED ORDER — AMLODIPINE BESYLATE 10 MG PO TABS: 10.0000 mg | ORAL_TABLET | Freq: Every day | ORAL | Status: DC

## 2021-01-17 MED ORDER — ACETAMINOPHEN 325 MG PO TABS: 325.0000 mg | ORAL_TABLET | Freq: Four times a day (QID) | ORAL | Status: DC | PRN

## 2021-01-17 MED ORDER — PANTOPRAZOLE SODIUM 40 MG PO TBEC: 40.0000 mg | DELAYED_RELEASE_TABLET | Freq: Every day | ORAL | Status: DC

## 2021-01-17 NOTE — Progress Notes (Signed)
Physical Therapy Treatment Patient Details Name: Bethany Hicks MRN: [id number] DOB: 02/14/1928 Today's Date: 01/17/2021    History of Present Illness 87 y.o. female with medical history significant of hypertension, hyperlipidemia, GERD, hypothyroidism, depression, anxiety, panic attack, tobacco, atrial arrhythmia?, who presents with fall and right ankle pain, found to be fractured.   s/p R ankle ORIF 3/25.    PT Comments    Patient received back in bed, finishing with bath. She is agreeable to exercises in bed, wants to remain in bed at this time. NT present in room and reports patient is moving well. Has been up to chair and BSC this morning.  Patient is very motivated to return to baseline. She performed bed exercises without difficulty. Patient will continue to benefit from skilled PT while here to improve functional independence.     Follow Up Recommendations  SNF     Equipment Recommendations  Rolling walker with 5" wheels    Recommendations for Other Services       Precautions / Restrictions Precautions Precautions: Fall Restrictions Weight Bearing Restrictions: Yes RLE Weight Bearing: Non weight bearing    Mobility  Bed Mobility               General bed mobility comments: Patient has been up to recliner and BSC this morning. just got back into bed. Wants to stay in bed at this time    Transfers                 General transfer comment: not performed with me  Ambulation/Gait             General Gait Details: not performed this session   Stairs             Wheelchair Mobility    Modified Rankin (Stroke Patients Only)       Balance                                            Cognition Arousal/Alertness: Awake/alert Behavior During Therapy: WFL for tasks assessed/performed Overall Cognitive Status: History of cognitive impairments - at baseline                                 General Comments: Pt  very pleasant and eager to work with PT      Exercises Other Exercises Other Exercises: B LE exercises: SLR, Heel slides, hip abd/add, saq, add squeezes, glute sets x10 reps each    General Comments        Pertinent Vitals/Pain Pain Assessment: No/denies pain Pain Descriptors / Indicators: Discomfort Pain Intervention(s): Monitored during session;Repositioned    Home Living                      Prior Function            PT Goals (current goals can now be found in the care plan section) Acute Rehab PT Goals Patient Stated Goal: Go to rehab for a while and return home PT Goal Formulation: With patient Time For Goal Achievement: 01/28/21 Potential to Achieve Goals: Good Progress towards PT goals: Progressing toward goals    Frequency    BID      PT Plan Current plan remains appropriate    Co-evaluation  AM-PAC PT "6 Clicks" Mobility   Outcome Measure  Help needed turning from your back to your side while in a flat bed without using bedrails?: A Little Help needed moving from lying on your back to sitting on the side of a flat bed without using bedrails?: A Little Help needed moving to and from a bed to a chair (including a wheelchair)?: A Lot Help needed standing up from a chair using your arms (e.g., wheelchair or bedside chair)?: A Lot Help needed to walk in hospital room?: A Lot Help needed climbing 3-5 steps with a railing? : Total 6 Click Score: 13    End of Session   Activity Tolerance: Patient tolerated treatment well Patient left: in bed;with call bell/phone within reach;with family/visitor present;with SCD's reapplied   PT Visit Diagnosis: Muscle weakness (generalized) (M62.81);Difficulty in walking, not elsewhere classified (R26.2)     Time: 8675-4492 PT Time Calculation (min) (ACUTE ONLY): 11 min  Charges:  $Therapeutic Exercise: 8-22 mins                     Cordae Mccarey, PT, GCS 01/17/21,10:58 AM

## 2021-01-17 NOTE — Discharge Summary (Signed)
Physician Discharge Summary  Bethany Hicks PJK:[id number] DOB: 04/14/1928 DOA: 01/11/2021  PCP: Rusty Aus, MD  Admit date: 01/11/2021 Discharge date: 01/17/2021  Admitted From: Home Disposition: SNF, [facility]  Recommendations for Outpatient Follow-up:  1. Follow up with PCP in 1-2 weeks 2. Follow-up orthopedic surgery in 2 weeks  Home Health: No Equipment/Devices: None Discharge Condition: Stable CODE STATUS: Full Diet recommendation: Regular Brief/Interim Summary: 87 y.o.femalewith medical history significant ofhypertension, hyperlipidemia, GERD, hypothyroidism, depression, anxiety, panic attack, tobacco, atrial arrhythmia?, who presents with fall and right ankle pain.  Per her daughter at the bedside, patientfell when shewas walking to the bathroom, andslipped on something wet on the floor. No LOC. She injured her right ankle, causing swelling and pain in right ankle. Thepain is constant, moderate to severe, sharp, nonradiating.No leg numbness. She also injured the back of head anddevelopeda hematoma in the back of her head. No neck pain.No unilateral numbness ortingling in extremities. No facial droop or slurred speech,denies dizziness or lightheadedness.  Imaging confirmed multiple complex fractures in the right ankle and a disruption of the tibiotalar joint space.  Orthopedic consulted from admission.  Patient underwent operative fixation on 3/25.  Tolerated procedure well.  Postoperative pain minimal.  Physical therapy and Occupational Therapy recommended skilled nursing facility.  Discussed with family and they are in agreement.  TOC consult in place.  Blood pressure control improving.  Blood pressure improved at time of discharge.  Pain well controlled.  Stable for discharge to skilled nursing facility.  Prescription left for Lovenox and as needed oxycodone by orthopedic surgery service  Discharge Diagnoses:  Principal Problem:   Closed right ankle  fracture Active Problems:   Hypothyroidism   HLD (hyperlipidemia)   Essential hypertension   Depression   Fall  Closed right ankle fracture  X-ray showed multiple complex fractures about the right ankle with severe disruption of the tibiotalar joint space as described above. No neurovascular compromise.  Orthopedic surgeon, Dr. Laverda Sorenson consulted Status post ORIF on 3/25 Postoperative pain well controlled SQ Lovenox for VT prophylaxis Continue multimodal pain control Stable for discharge to skilled nursing facility Bowel regimen while on narcotics Follow-up outpatient orthopedics   Hypothyroidism -Synthroid  HLD (hyperlipidemia)  Not taking medications currently -Outpatient follow-up  Essential hypertension -Amlodipine, dose increased to 10 mg daily  Depression: -Paxil  Fall PT and OT, recommend skilled nursing facility  Memory loss/dementia? -Namenda, Exelon   Discharge Instructions  Discharge Instructions    Diet - low sodium heart healthy   Complete by: As directed    Increase activity slowly   Complete by: As directed    No wound care   Complete by: As directed      Allergies as of 01/17/2021      Reactions   Simvastatin Other (See Comments)   Muscle pain   Venlafaxine Other (See Comments)   Hallucinations    Ace Inhibitors Cough   Amlodipine Besylate    REACTION: Fatigue   Calcitonin (salmon)    Ciprofloxacin Swelling   Joint swelling.   Doxazosin Mesylate    REACTION: Urine incont.   Hydrocod Polst-cpm Polst Er    Raloxifene    REACTION: Pain   Risedronate Sodium    REACTION: GI   Codeine Anxiety   Singulair [montelukast] Anxiety      Medication List    TAKE these medications   acetaminophen 325 MG tablet Commonly known as: TYLENOL Take 1-2 tablets (325-650 mg total) by mouth every 6 (six) hours as  needed for mild pain (pain score 1-3 or temp > 100.5).   amLODipine 10 MG tablet Commonly known as: NORVASC Take 1 tablet  (10 mg total) by mouth daily. Start taking on: January 18, 2021 What changed:   medication strength  how much to take  Another medication with the same name was removed. Continue taking this medication, and follow the directions you see here.   cholecalciferol 10 MCG (400 UNIT) Tabs tablet Commonly known as: VITAMIN D3 Take 1,000 Units by mouth daily.   cyanocobalamin 1000 MCG/ML injection Commonly known as: (VITAMIN B-12) Inject 1,000 mcg into the muscle every 30 (thirty) days.   enoxaparin 40 MG/0.4ML injection Commonly known as: LOVENOX Inject 0.4 mLs (40 mg total) into the skin daily for 14 days.   memantine 5 MG tablet Commonly known as: NAMENDA Take 5 mg by mouth 2 (two) times daily.   oxyCODONE 5 MG immediate release tablet Commonly known as: Oxy IR/ROXICODONE Take 1 tablet (5 mg total) by mouth every 6 (six) hours as needed for moderate pain (pain score 4-6).   pantoprazole 40 MG tablet Commonly known as: PROTONIX Take 1 tablet (40 mg total) by mouth daily. Start taking on: January 18, 2021   PARoxetine 10 MG tablet Commonly known as: PAXIL Take 10 mg by mouth 2 (two) times daily.   potassium chloride 10 MEQ tablet Commonly known as: KLOR-CON Take 10 mEq by mouth daily.   rivastigmine 3 MG capsule Commonly known as: EXELON Take 3 mg by mouth 2 (two) times daily.   senna-docusate 8.6-50 MG tablet Commonly known as: Senokot-S Take 1 tablet by mouth 2 (two) times daily.   sodium chloride 1 g tablet Take 1 tablet (1 g total) by mouth 3 (three) times daily.   Synthroid 88 MCG tablet Generic drug: levothyroxine Take 88 mcg by mouth every morning.   traMADol 50 MG tablet Commonly known as: ULTRAM Take 1-2 tablets (50-100 mg total) by mouth every 6 (six) hours as needed for moderate pain.       Follow-up Information    Fausto Skillern, PA-C. Schedule an appointment as soon as possible for a visit in 2 week(s).   Specialty: Orthopedic Surgery Why: For  staple removal and splint change Contact information: [place] [address] [phone]              Allergies  Allergen Reactions  . Simvastatin Other (See Comments)    Muscle pain  . Venlafaxine Other (See Comments)    Hallucinations   . Ace Inhibitors Cough  . Amlodipine Besylate     REACTION: Fatigue  . Calcitonin ([place])   . Ciprofloxacin Swelling    Joint swelling.  . Doxazosin Mesylate     REACTION: Urine incont.  . Hydrocod Polst-Cpm Polst Er   . Raloxifene     REACTION: Pain  . Risedronate Sodium     REACTION: GI  . Codeine Anxiety  . Singulair [Montelukast] Anxiety    Consultations:  Orthopedic surgery   Procedures/Studies: DG Ankle Complete Right  Result Date: 01/11/2021 CLINICAL DATA:  Injury.  Swelling and pain. EXAM: RIGHT ANKLE - COMPLETE 3+ VIEW COMPARISON:  No prior. FINDINGS: Comminuted angulated fracture noted of the distal right fibular shaft. Small avulsion fracture noted along the distal tip of the lateral malleolus. Displaced angulated fracture noted of the medial malleolus. Prominent fracture fragment noted over the lateral aspect of the distal tibial epiphysis. A fracture of  the posterior malleolus cannot be completely excluded. Fracture fragment age undetermined noted medial to the talus. Severe disruption of the tibiotalar joint space. IMPRESSION: Multiple complex fractures about the right ankle with severe disruption of the tibiotalar joint space as described above. CT of the right ankle may prove useful for further evaluation. Electronically Signed   By: Marcello Moores  Register   On: 01/11/2021 07:19   CT Head Wo Contrast  Result Date: 01/11/2021 CLINICAL DATA:  Slipped on wet floor, swelling hematoma posteriorly. EXAM: CT HEAD WITHOUT CONTRAST TECHNIQUE: Contiguous axial images were obtained from the base of the skull through the vertex without intravenous contrast.  COMPARISON:  CT 04/21/2020, MR 03/02/2019 FINDINGS: Brain: No evidence of acute infarction, hemorrhage, hydrocephalus, extra-axial collection, visible mass lesion or mass effect. Symmetric prominence of the ventricles, cisterns and sulci compatible with parenchymal volume loss. Patchy areas of white matter hypoattenuation are most compatible with chronic microvascular angiopathy. Stable benign dural calcifications. Vascular: Atherosclerotic calcification of the carotid siphons and intradural vertebral arteries. No hyperdense vessel. Skull: High posterior midline parietal scalp swelling and thickening with thin crescentic hematoma scalp hematoma measuring up to 4 mm in maximal thickness. No subjacent calvarial fracture. Mild hyperostosis frontalis interna is a benign, typically incidental finding. Sinuses/Orbits: Redemonstration of a trace right effusion with hyperostotic changes favoring chronicity. Paranasal sinuses and mastoid air cells are otherwise predominantly clear. Orbital structures are unremarkable aside from prior lens extractions. Other: Bilateral TMJ arthrosis. IMPRESSION: 1. High posterior midline parietal scalp swelling and thickening with thin crescentic hematoma scalp hematoma measuring up to 4 mm in maximal thickness. No subjacent calvarial fracture. 2. No acute intracranial abnormality. 3. Chronic microvascular angiopathy and parenchymal volume loss. 4. Bilateral TMJ arthrosis. Electronically Signed   By: Lovena Le M.D.   On: 01/11/2021 06:57   DG MINI C-ARM IMAGE ONLY  Result Date: 01/13/2021 There is no interpretation for this exam.  This order is for images obtained during a surgical procedure.  Please See "Surgeries" Tab for more information regarding the procedure.    (Echo, Carotid, EGD, Colonoscopy, ERCP)    Subjective: Patient seen and examined the day of discharge.  Stable, no distress.  Pain well controlled.  Stable for discharge to skilled nursing facility.  Discharge  Exam: Vitals:   01/17/21 0039 01/17/21 0511  BP: (!) 167/75 (!) 162/82  Pulse: 71 70  Resp: 18 19  Temp: 98.4 F (36.9 C) 98.1 F (36.7 C)  SpO2: 91% 91%   Vitals:   01/16/21 1531 01/16/21 2015 01/17/21 0039 01/17/21 0511  BP: (!) 166/84 (!) 165/84 (!) 167/75 (!) 162/82  Pulse: 69 70 71 70  Resp: 18 18 18 19   Temp: 98.9 F (37.2 C) 98.5 F (36.9 C) 98.4 F (36.9 C) 98.1 F (36.7 C)  TempSrc:    Oral  SpO2: 94% 94% 91% 91%  Weight:      Height:        General: Pt is alert, awake, not in acute distress Cardiovascular: RRR, S1/S2 +, no rubs, no gallops Respiratory: CTA bilaterally, no wheezing, no rhonchi Abdominal: Soft, NT, ND, bowel sounds + Extremities: Right foot in cast.  Good sensation.  Able to move toes    The results of significant diagnostics from this hospitalization (including imaging, microbiology, ancillary and laboratory) are listed below for reference.     Microbiology: Recent Results (from the past 240 hour(s))  Resp Panel by RT-PCR (Flu A&B, Covid) Nasopharyngeal Swab     Status: None   Collection  Time: 01/11/21 10:41 AM   Specimen: Nasopharyngeal Swab; Nasopharyngeal(NP) swabs in vial transport medium  Result Value Ref Range Status   SARS Coronavirus 2 by RT PCR NEGATIVE NEGATIVE Final    Comment: (NOTE) SARS-CoV-2 target nucleic acids are NOT DETECTED.  The SARS-CoV-2 RNA is generally detectable in upper respiratory specimens during the acute phase of infection. The lowest concentration of SARS-CoV-2 viral copies this assay can detect is 138 copies/mL. A negative result does not preclude SARS-Cov-2 infection and should not be used as the sole basis for treatment or other patient management decisions. A negative result may occur with  improper specimen collection/handling, submission of specimen other than nasopharyngeal swab, presence of viral mutation(s) within the areas targeted by this assay, and inadequate number of viral copies(<138  copies/mL). A negative result must be combined with clinical observations, patient history, and epidemiological information. The expected result is Negative.  Fact Sheet for Patients:  EntrepreneurPulse.com.au  Fact Sheet for Healthcare Providers:  IncredibleEmployment.be  This test is no t yet approved or cleared by the [place] FDA and  has been authorized for detection and/or diagnosis of SARS-CoV-2 by FDA under an Emergency Use Authorization (EUA). This EUA will remain  in effect (meaning this test can be used) for the duration of the COVID-19 declaration under Section 564(b)(1) of the Act, 21 U.S.C.section 360bbb-3(b)(1), unless the authorization is terminated  or revoked sooner.       Influenza A by PCR NEGATIVE NEGATIVE Final   Influenza B by PCR NEGATIVE NEGATIVE Final    Comment: (NOTE) The Xpert Xpress SARS-CoV-2/FLU/RSV plus assay is intended as an aid in the diagnosis of influenza from Nasopharyngeal swab specimens and should not be used as a sole basis for treatment. Nasal washings and aspirates are unacceptable for Xpert Xpress SARS-CoV-2/FLU/RSV testing.  Fact Sheet for Patients: EntrepreneurPulse.com.au  Fact Sheet for Healthcare Providers: IncredibleEmployment.be  This test is not yet approved or cleared by the [place] FDA and has been authorized for detection and/or diagnosis of SARS-CoV-2 by FDA under an Emergency Use Authorization (EUA). This EUA will remain in effect (meaning this test can be used) for the duration of the COVID-19 declaration under Section 564(b)(1) of the Act, 21 U.S.C. section 360bbb-3(b)(1), unless the authorization is terminated or revoked.  Performed at [facility], [place], [place] [address]   SARS CORONAVIRUS 2 (TAT 6-24 HRS) Nasopharyngeal Nasopharyngeal Swab     Status: None   Collection Time: 01/16/21 12:36 PM    Specimen: Nasopharyngeal Swab  Result Value Ref Range Status   SARS Coronavirus 2 NEGATIVE NEGATIVE Final    Comment: (NOTE) SARS-CoV-2 target nucleic acids are NOT DETECTED.  The SARS-CoV-2 RNA is generally detectable in upper and lower respiratory specimens during the acute phase of infection. Negative results do not preclude SARS-CoV-2 infection, do not rule out co-infections with other pathogens, and should not be used as the sole basis for treatment or other patient management decisions. Negative results must be combined with clinical observations, patient history, and epidemiological information. The expected result is Negative.  Fact Sheet for Patients: SugarRoll.be  Fact Sheet for Healthcare Providers: https://www.woods-mathews.com/  This test is not yet approved or cleared by the [place] FDA and  has been authorized for detection and/or diagnosis of SARS-CoV-2 by FDA under an Emergency Use Authorization (EUA). This EUA will remain  in effect (meaning this test can be used) for the duration of the COVID-19 declaration under Se ction 564(b)(1) of the  Act, 21 U.S.C. section 360bbb-3(b)(1), unless the authorization is terminated or revoked sooner.  Performed at [facility], [place] [address] [place], [place] [address]      Labs: BNP (last 3 results) Recent Labs    01/11/21 0813  BNP 240.9*   Basic Metabolic Panel: Recent Labs  Lab 01/11/21 0813 01/12/21 0544 01/14/21 0759  NA 140 139 139  K 3.5 3.3* 3.9  CL 105 103 106  CO2 26 29 26   GLUCOSE 95 108* 113*  BUN 20 18 15   CREATININE 0.83 0.91 0.78  CALCIUM 8.7* 8.6* 8.2*  MG  --   --  1.8   Liver Function Tests: No results for input(s): AST, ALT, ALKPHOS, BILITOT, PROT, ALBUMIN in the last 168 hours. No results for input(s): LIPASE, AMYLASE in the last 168 hours. No results for input(s): AMMONIA in the last 168 hours. CBC: Recent Labs  Lab  01/11/21 0813 01/12/21 0544 01/14/21 0759  WBC 7.6 6.6 6.9  NEUTROABS 5.7  --  5.3  HGB 12.6 10.9* 11.6*  HCT 38.2 33.8* 35.9*  MCV 91.4 91.8 92.8  PLT 175 149* 165   Cardiac Enzymes: No results for input(s): CKTOTAL, CKMB, CKMBINDEX, TROPONINI in the last 168 hours. BNP: Invalid input(s): POCBNP CBG: No results for input(s): GLUCAP in the last 168 hours. D-Dimer No results for input(s): DDIMER in the last 72 hours. Hgb A1c No results for input(s): HGBA1C in the last 72 hours. Lipid Profile No results for input(s): CHOL, HDL, LDLCALC, TRIG, CHOLHDL, LDLDIRECT in the last 72 hours. Thyroid function studies No results for input(s): TSH, T4TOTAL, T3FREE, THYROIDAB in the last 72 hours.  Invalid input(s): FREET3 Anemia work up No results for input(s): VITAMINB12, FOLATE, FERRITIN, TIBC, IRON, RETICCTPCT in the last 72 hours. Urinalysis    Component Value Date/Time   COLORURINE YELLOW (A) 09/17/2018 1903   APPEARANCEUR CLOUDY (A) 09/17/2018 1903   APPEARANCEUR Clear 05/04/2012 0613   LABSPEC 1.015 09/17/2018 1903   LABSPEC 1.014 05/04/2012 0613   PHURINE 5.0 09/17/2018 1903   GLUCOSEU NEGATIVE 09/17/2018 1903   GLUCOSEU Negative 05/04/2012 0613   HGBUR SMALL (A) 09/17/2018 1903   BILIRUBINUR NEGATIVE 09/17/2018 1903   BILIRUBINUR Negative 05/04/2012 [place] 09/17/2018 1903   PROTEINUR 30 (A) 09/17/2018 1903   NITRITE NEGATIVE 09/17/2018 1903   LEUKOCYTESUR MODERATE (A) 09/17/2018 1903   LEUKOCYTESUR Negative 05/04/2012 0613   Sepsis Labs Invalid input(s): PROCALCITONIN,  WBC,  LACTICIDVEN Microbiology Recent Results (from the past 240 hour(s))  Resp Panel by RT-PCR (Flu A&B, Covid) Nasopharyngeal Swab     Status: None   Collection Time: 01/11/21 10:41 AM   Specimen: Nasopharyngeal Swab; Nasopharyngeal(NP) swabs in vial transport medium  Result Value Ref Range Status   SARS Coronavirus 2 by RT PCR NEGATIVE NEGATIVE Final    Comment:  (NOTE) SARS-CoV-2 target nucleic acids are NOT DETECTED.  The SARS-CoV-2 RNA is generally detectable in upper respiratory specimens during the acute phase of infection. The lowest concentration of SARS-CoV-2 viral copies this assay can detect is 138 copies/mL. A negative result does not preclude SARS-Cov-2 infection and should not be used as the sole basis for treatment or other patient management decisions. A negative result may occur with  improper specimen collection/handling, submission of specimen other than nasopharyngeal swab, presence of viral mutation(s) within the areas targeted by this assay, and inadequate number of viral copies(<138 copies/mL). A negative result must be combined with clinical observations, patient history, and epidemiological  information. The expected result is Negative.  Fact Sheet for Patients:  EntrepreneurPulse.com.au  Fact Sheet for Healthcare Providers:  IncredibleEmployment.be  This test is no t yet approved or cleared by the [place] FDA and  has been authorized for detection and/or diagnosis of SARS-CoV-2 by FDA under an Emergency Use Authorization (EUA). This EUA will remain  in effect (meaning this test can be used) for the duration of the COVID-19 declaration under Section 564(b)(1) of the Act, 21 U.S.C.section 360bbb-3(b)(1), unless the authorization is terminated  or revoked sooner.       Influenza A by PCR NEGATIVE NEGATIVE Final   Influenza B by PCR NEGATIVE NEGATIVE Final    Comment: (NOTE) The Xpert Xpress SARS-CoV-2/FLU/RSV plus assay is intended as an aid in the diagnosis of influenza from Nasopharyngeal swab specimens and should not be used as a sole basis for treatment. Nasal washings and aspirates are unacceptable for Xpert Xpress SARS-CoV-2/FLU/RSV testing.  Fact Sheet for Patients: EntrepreneurPulse.com.au  Fact Sheet for Healthcare  Providers: IncredibleEmployment.be  This test is not yet approved or cleared by the [place] FDA and has been authorized for detection and/or diagnosis of SARS-CoV-2 by FDA under an Emergency Use Authorization (EUA). This EUA will remain in effect (meaning this test can be used) for the duration of the COVID-19 declaration under Section 564(b)(1) of the Act, 21 U.S.C. section 360bbb-3(b)(1), unless the authorization is terminated or revoked.  Performed at [facility], [place], [place] [address]   SARS CORONAVIRUS 2 (TAT 6-24 HRS) Nasopharyngeal Nasopharyngeal Swab     Status: None   Collection Time: 01/16/21 12:36 PM   Specimen: Nasopharyngeal Swab  Result Value Ref Range Status   SARS Coronavirus 2 NEGATIVE NEGATIVE Final    Comment: (NOTE) SARS-CoV-2 target nucleic acids are NOT DETECTED.  The SARS-CoV-2 RNA is generally detectable in upper and lower respiratory specimens during the acute phase of infection. Negative results do not preclude SARS-CoV-2 infection, do not rule out co-infections with other pathogens, and should not be used as the sole basis for treatment or other patient management decisions. Negative results must be combined with clinical observations, patient history, and epidemiological information. The expected result is Negative.  Fact Sheet for Patients: SugarRoll.be  Fact Sheet for Healthcare Providers: https://www.woods-mathews.com/  This test is not yet approved or cleared by the [place] FDA and  has been authorized for detection and/or diagnosis of SARS-CoV-2 by FDA under an Emergency Use Authorization (EUA). This EUA will remain  in effect (meaning this test can be used) for the duration of the COVID-19 declaration under Se ction 564(b)(1) of the Act, 21 U.S.C. section 360bbb-3(b)(1), unless the authorization is terminated or revoked sooner.  Performed  at [facility], [place] [address] [place], [place] [address]      Time coordinating discharge: Over 30 minutes  SIGNED:   Sidney Ace, MD  [facility] 01/17/2021, 11:48 AM Pager   If 7PM-7AM, please contact night-coverage

## 2021-01-17 NOTE — TOC Progression Note (Signed)
Transition of Care [facility]) - Progression Note    Patient Details  Name: Bethany Hicks MRN: [id number] Date of Birth: 09-14-1928  Transition of Care [facility]) CM/SW [place], RN Phone Number: 01/17/2021, 8:51 AM  Clinical Narrative:   CM called [facility] and started the auth process, Spoke with Tammy, awaiting approval to go to [facility], Spoke with the patient's daughter [name]. Explained the process, anticipate DC today   Expected Discharge Plan: Skilled Nursing Facility Barriers to Discharge: Continued Medical Work up,SNF Pending bed offer  Expected Discharge Plan and Services Expected Discharge Plan: [place] Choice: [place] arrangements for the past 2 months: Single Family Home                                       Social Determinants of Health (SDOH) Interventions    Readmission Risk Interventions No flowsheet data found.

## 2021-01-17 NOTE — TOC Progression Note (Cosign Needed)
Transition of Care [facility]) - Progression Note    Patient Details  Name: Bethany Hicks MRN: [id number] Date of Birth: Jun 07, 1928  Transition of Care [facility]) CM/SW [place], RN Phone Number: 01/17/2021, 1:18 PM  Clinical Narrative:   CM called [name], the patient's daughter and notified her that the room number at [facility] is 802, the patient has had a negative covid test, CM called [facility] and spoke with Tammy, She provided me with the number for Marlowe Kays the review nurse, [phone], [place] and left a VM requesting update on approval, awaiting a response. Notified the nurse    Expected Discharge Plan: Skilled Nursing Facility Barriers to Discharge: Continued Medical Work up,SNF Pending bed offer  Expected Discharge Plan and Services Expected Discharge Plan: [place] Choice: [place] arrangements for the past 2 months: Single Family Home Expected Discharge Date: 01/17/21                                     Social Determinants of Health (SDOH) Interventions    Readmission Risk Interventions No flowsheet data found.

## 2021-01-17 NOTE — TOC Progression Note (Signed)
Transition of Care [facility]) - Progression Note    Patient Details  Name: Bethany Hicks MRN: [id number] Date of Birth: 12-30-1927  Transition of Care [facility]) CM/SW [place], RN Phone Number: 01/17/2021, 1:47 PM  Clinical Narrative:   Damaris Schooner to Marlowe Kays from [facility] and received approval for SNF Auth number [id number] and EMS auth [id number], Called Tammy at [facility] and gave the approval information, called [name] the daughter with the approval number Let her know [facility] transport to [facility] room 802 between 4-430    Expected Discharge Plan: [place] Barriers to Discharge: Continued Medical Work up,SNF Pending bed offer  Expected Discharge Plan and Services Expected Discharge Plan: [place] [facility]: [place] arrangements for the past 2 months: Single Family Home Expected Discharge Date: 01/17/21                                     Social Determinants of Health (SDOH) Interventions    Readmission Risk Interventions No flowsheet data found.

## 2021-01-17 NOTE — Progress Notes (Signed)
Physical Therapy Treatment Patient Details Name: Bethany Hicks MRN: [id number] DOB: 01/19/1928 Today's Date: 01/17/2021    History of Present Illness 87 y.o. female with medical history significant of hypertension, hyperlipidemia, GERD, hypothyroidism, depression, anxiety, panic attack, tobacco, atrial arrhythmia?, who presents with fall and right ankle pain, found to be fractured.   s/p R ankle ORIF 3/25.    PT Comments    Patient received in bed, patient reports she is tired. Plan is for patient to leave around 4 to go to SNF. Patient agrees to bed exercises. Increased reps this session. Patient is motivated and independent with bed exercises with cues. Patient will continue to benefit from skilled PT while here to improve strength and functional independence.        Follow Up Recommendations  SNF     Equipment Recommendations  Rolling walker with 5" wheels    Recommendations for Other Services       Precautions / Restrictions Precautions Precautions: Fall Restrictions Weight Bearing Restrictions: Yes RLE Weight Bearing: Non weight bearing    Mobility  Bed Mobility               General bed mobility comments: This pm, patient reports she is tired, agrees to bed exercises only    Transfers                 General transfer comment: patient declined  Ambulation/Gait             General Gait Details: not perfomed   Stairs             Wheelchair Mobility    Modified Rankin (Stroke Patients Only)       Balance                                            Cognition Arousal/Alertness: Awake/alert Behavior During Therapy: WFL for tasks assessed/performed Overall Cognitive Status: History of cognitive impairments - at baseline                                 General Comments: Pt very pleasant and eager to work with PT      Exercises Other Exercises Other Exercises: B LE exercises: SLR, Heel slides, hip  abd/add, saq, add squeezes, glute sets x15 reps each    General Comments        Pertinent Vitals/Pain Pain Assessment: Faces Faces Pain Scale: Hurts a little bit Pain Location: Sore behind her knee Pain Descriptors / Indicators: Sore Pain Intervention(s): Monitored during session;Repositioned;Ice applied    Home Living                      Prior Function            PT Goals (current goals can now be found in the care plan section) Acute Rehab PT Goals Patient Stated Goal: Go to rehab for a while and return home PT Goal Formulation: With patient Time For Goal Achievement: 01/28/21 Potential to Achieve Goals: Good Progress towards PT goals: Progressing toward goals    Frequency    BID      PT Plan Current plan remains appropriate    Co-evaluation              AM-PAC PT "6 Clicks" Mobility   Outcome  Measure  Help needed turning from your back to your side while in a flat bed without using bedrails?: A Little Help needed moving from lying on your back to sitting on the side of a flat bed without using bedrails?: A Little Help needed moving to and from a bed to a chair (including a wheelchair)?: A Lot Help needed standing up from a chair using your arms (e.g., wheelchair or bedside chair)?: A Lot Help needed to walk in hospital room?: A Lot Help needed climbing 3-5 steps with a railing? : Total 6 Click Score: 13    End of Session   Activity Tolerance: Patient tolerated treatment well Patient left: in bed;with call bell/phone within reach;with SCD's reapplied Nurse Communication: Mobility status PT Visit Diagnosis: Muscle weakness (generalized) (M62.81);Difficulty in walking, not elsewhere classified (R26.2)     Time: 1031-5945 PT Time Calculation (min) (ACUTE ONLY): 11 min  Charges:  $Therapeutic Exercise: 8-22 mins                     Genecis Veley, PT, GCS 01/17/21,2:53 PM

## 2021-01-17 NOTE — Progress Notes (Signed)
Pt d/c to [facility] this afternoon via [facility].  D/c paperwork and AVS taken with pt.  This nurse attempted to call [facility] to give report x 2 without success.  Will attempt to call again.  Polar care taken at time of d/c.  VVS NAD noted.  Daughter at bedside.

## 2021-01-17 NOTE — Progress Notes (Signed)
  Subjective: 4 Days Post-Op Procedure(s) (LRB): OPEN REDUCTION INTERNAL FIXATION (ORIF) ANKLE FRACTURE-Bimalleolar (Right) Patient reports pain as well-controlled.   Patient is well, and has had no acute complaints or problems Plan is to go Skilled nursing facility after hospital stay. Negative for chest pain and shortness of breath Fever: no Gastrointestinal: negative for nausea and vomiting.   Patient has had a bowel movement.  Objective: Vital signs in last 24 hours: Temp:  [98.1 F (36.7 C)-98.9 F (37.2 C)] 98.1 F (36.7 C) (03/29 0511) Pulse Rate:  [69-78] 70 (03/29 0511) Resp:  [18-20] 19 (03/29 0511) BP: (151-167)/(75-84) 162/82 (03/29 0511) SpO2:  [91 %-94 %] 91 % (03/29 0511)  Intake/Output from previous day:  Intake/Output Summary (Last 24 hours) at 01/17/2021 1028 Last data filed at 01/17/2021 1016 Gross per 24 hour  Intake 480 ml  Output 2050 ml  Net -1570 ml    Intake/Output this shift: Total I/O In: 360 [P.O.:360] Out: -   Labs: No results for input(s): HGB in the last 72 hours. No results for input(s): WBC, RBC, HCT, PLT in the last 72 hours. No results for input(s): NA, K, CL, CO2, BUN, CREATININE, GLUCOSE, CALCIUM in the last 72 hours. No results for input(s): LABPT, INR in the last 72 hours.   EXAM General - Patient is Alert, Appropriate and Oriented Extremity - sensation intact over exposed foot, cap refill < 2seconds Dressing/Incision -splint and polar care in place  Motor Function - intact, moving  toes well on exam.  Cardiovascular- Regular rate and rhythm, no murmurs/rubs/gallops Respiratory- Lungs clear to auscultation bilaterally Gastrointestinal- soft, nontender and active bowel sounds   Assessment/Plan: 4 Days Post-Op Procedure(s) (LRB): OPEN REDUCTION INTERNAL FIXATION (ORIF) ANKLE FRACTURE-Bimalleolar (Right) Principal Problem:   Closed right ankle fracture Active Problems:   Hypothyroidism   HLD (hyperlipidemia)   Essential  hypertension   Depression   Fall  Estimated body mass index is 27.29 kg/m as calculated from the following:   Height as of this encounter: 5\' 5"  (1.651 m).   Weight as of this encounter: 74.4 kg. Advance diet Up with therapy  Plan for d/c today. Patient will see me in 2 weeks for staple removal.  DVT Prophylaxis - Lovenox, Ted hose and SCDs Weight-Bearing as tolerated to right leg  Cassell Smiles, PA-C [facility] 01/17/2021, 10:28 AM

## 2021-01-17 NOTE — Plan of Care (Signed)
  Problem: Activity: Goal: Risk for activity intolerance will decrease Outcome: Progressing   Problem: Education: Goal: Knowledge of General Education information will improve Description: Including pain rating scale, medication(s)/side effects and non-pharmacologic comfort measures Outcome: Not Progressing Note: Provide education with care provider   Problem: Nutrition: Goal: Adequate nutrition will be maintained Outcome: Completed/Met   Problem: Coping: Goal: Level of anxiety will decrease Outcome: Completed/Met

## 2021-01-18 DIAGNOSIS — M84471D Pathological fracture, right ankle, subsequent encounter for fracture with routine healing: Secondary | ICD-10-CM | POA: Diagnosis not present

## 2021-01-18 DIAGNOSIS — E876 Hypokalemia: Secondary | ICD-10-CM | POA: Diagnosis not present

## 2021-01-18 DIAGNOSIS — I10 Essential (primary) hypertension: Secondary | ICD-10-CM | POA: Diagnosis not present

## 2021-01-18 DIAGNOSIS — F0390 Unspecified dementia without behavioral disturbance: Secondary | ICD-10-CM | POA: Diagnosis not present

## 2021-01-18 DIAGNOSIS — E039 Hypothyroidism, unspecified: Secondary | ICD-10-CM | POA: Diagnosis not present

## 2021-01-20 ENCOUNTER — Encounter: Payer: Self-pay | Admitting: Orthopedic Surgery

## 2021-01-23 DIAGNOSIS — F0390 Unspecified dementia without behavioral disturbance: Secondary | ICD-10-CM | POA: Diagnosis not present

## 2021-01-23 DIAGNOSIS — E871 Hypo-osmolality and hyponatremia: Secondary | ICD-10-CM | POA: Diagnosis not present

## 2021-01-23 DIAGNOSIS — M84471D Pathological fracture, right ankle, subsequent encounter for fracture with routine healing: Secondary | ICD-10-CM | POA: Diagnosis not present

## 2021-01-31 DIAGNOSIS — Z8781 Personal history of (healed) traumatic fracture: Secondary | ICD-10-CM | POA: Diagnosis not present

## 2021-01-31 DIAGNOSIS — Z9889 Other specified postprocedural states: Secondary | ICD-10-CM | POA: Diagnosis not present

## 2021-02-09 ENCOUNTER — Emergency Department
Admission: EM | Admit: 2021-02-09 | Discharge: 2021-02-09 | Disposition: A | Discharge status: Home / Self Care | Payer: PPO | Attending: Physician Assistant | Admitting: Physician Assistant

## 2021-02-09 ENCOUNTER — Emergency Department: Payer: PPO

## 2021-02-09 DIAGNOSIS — S82831A Other fracture of upper and lower end of right fibula, initial encounter for closed fracture: Secondary | ICD-10-CM | POA: Diagnosis not present

## 2021-02-09 DIAGNOSIS — F0390 Unspecified dementia without behavioral disturbance: Secondary | ICD-10-CM | POA: Insufficient documentation

## 2021-02-09 DIAGNOSIS — S0003XA Contusion of scalp, initial encounter: Secondary | ICD-10-CM | POA: Diagnosis not present

## 2021-02-09 DIAGNOSIS — E039 Hypothyroidism, unspecified: Secondary | ICD-10-CM | POA: Insufficient documentation

## 2021-02-09 DIAGNOSIS — Z96698 Presence of other orthopedic joint implants: Secondary | ICD-10-CM | POA: Insufficient documentation

## 2021-02-09 DIAGNOSIS — Z87891 Personal history of nicotine dependence: Secondary | ICD-10-CM | POA: Insufficient documentation

## 2021-02-09 DIAGNOSIS — S0990XA Unspecified injury of head, initial encounter: Secondary | ICD-10-CM | POA: Diagnosis not present

## 2021-02-09 DIAGNOSIS — M84471D Pathological fracture, right ankle, subsequent encounter for fracture with routine healing: Secondary | ICD-10-CM | POA: Diagnosis not present

## 2021-02-09 DIAGNOSIS — I10 Essential (primary) hypertension: Secondary | ICD-10-CM | POA: Diagnosis not present

## 2021-02-09 DIAGNOSIS — M6281 Muscle weakness (generalized): Secondary | ICD-10-CM | POA: Diagnosis not present

## 2021-02-09 DIAGNOSIS — W19XXXA Unspecified fall, initial encounter: Secondary | ICD-10-CM | POA: Insufficient documentation

## 2021-02-09 DIAGNOSIS — Z79899 Other long term (current) drug therapy: Secondary | ICD-10-CM | POA: Insufficient documentation

## 2021-02-09 DIAGNOSIS — Y9389 Activity, other specified: Secondary | ICD-10-CM | POA: Insufficient documentation

## 2021-02-09 DIAGNOSIS — S8251XA Displaced fracture of medial malleolus of right tibia, initial encounter for closed fracture: Secondary | ICD-10-CM | POA: Diagnosis not present

## 2021-02-09 DIAGNOSIS — M79661 Pain in right lower leg: Secondary | ICD-10-CM | POA: Diagnosis not present

## 2021-02-09 DIAGNOSIS — Y92129 Unspecified place in nursing home as the place of occurrence of the external cause: Secondary | ICD-10-CM | POA: Diagnosis not present

## 2021-02-09 NOTE — ED Notes (Signed)
Pt consented to receiving d/c instructions and EMS transport back to facility.

## 2021-02-09 NOTE — ED Notes (Signed)
[facility] called for transport back to [facility]

## 2021-02-09 NOTE — ED Triage Notes (Signed)
Pt BIB EMS from [facility] for a fall at 0630 today.   Pt had previous fall and has a casted RLL. [facility] stated pt BP has been elevated and wanted her evaluated after fall.  Pt has hx of mild dementia.

## 2021-02-09 NOTE — ED Notes (Signed)
Pt has been provided with discharge instructions. Pt denies any questions or concerns at this time. Pt verbalizes understanding for follow up care and d/c.  VSS.  Pt left department with all belongings.  

## 2021-02-09 NOTE — ED Notes (Signed)
Took pt and mother drinks and graham crackers while they wait for ems transport

## 2021-02-09 NOTE — ED Provider Notes (Signed)
[facility] Emergency Department Provider Note   ____________________________________________   None    (approximate)  I have reviewed the triage vital signs and the nursing notes.   HISTORY  Chief Complaint Fall    HPI Bethany Hicks is a 87 y.o. female patient presents with right lower leg pain secondary to recent fall.  Patient is status post ORIF last month for severe ankle fracture.  Patient attempted to go to the bathroom without assistance and fell this morning.  Denies LOC or head injury.  [facility] also state patient blood pressure was elevated after the incident.  Patient has history of mild dementia.  Patient is denying pain at this time.         Past Medical History:  Diagnosis Date  . Anxiety   . Fluid collection (edema) in the arms, legs, hands and feet   . History of panic attacks   . Hypertension   . Hypothyroidism     Patient Active Problem List   Diagnosis Date Noted  . Closed right ankle fracture 01/11/2021  . Depression 01/11/2021  . Fall 01/11/2021  . HLD (hyperlipidemia) 07/21/2007  . ANXIETY 07/21/2007  . TOBACCO ABUSE 07/21/2007  . Essential hypertension 07/21/2007  . ALLERGIC RHINITIS 07/21/2007  . GERD 07/21/2007  . DIVERTICULOSIS, COLON 07/21/2007  . OSTEOPENIA 07/21/2007  . ATRIAL ARRHYTHMIAS 07/04/2007  . SKIN LESION 07/04/2007  . Hypothyroidism 03/20/2007  . HYPERCHOLESTEROLEMIA, PURE 03/20/2007    Past Surgical History:  Procedure Laterality Date  . ABDOMINAL HYSTERECTOMY    . CATARACT EXTRACTION W/PHACO Left 03/07/2015   Procedure: CATARACT EXTRACTION PHACO AND INTRAOCULAR LENS PLACEMENT (IOC);  Surgeon: Estill Cotta, MD;  Location: [facility] ORS;  Service: Ophthalmology;  Laterality: Left;  [place] 00:57 AP% 24.6 CDE 24.44  . CHOLECYSTECTOMY    . EYE SURGERY     cataract  . ORIF ANKLE FRACTURE Right 01/13/2021   Procedure: OPEN REDUCTION INTERNAL FIXATION (ORIF) ANKLE FRACTURE-Bimalleolar;  Surgeon:  Dereck Leep, MD;  Location: [facility] ORS;  Service: Orthopedics;  Laterality: Right;    Prior to Admission medications   Medication Sig Start Date End Date Taking? Authorizing Provider  acetaminophen (TYLENOL) 325 MG tablet Take 1-2 tablets (325-650 mg total) by mouth every 6 (six) hours as needed for mild pain (pain score 1-3 or temp > 100.5). 01/17/21   Ralene Muskrat B, MD  amLODipine (NORVASC) 10 MG tablet Take 1 tablet (10 mg total) by mouth daily. 01/18/21   Sidney Ace, MD  cholecalciferol (VITAMIN D) 400 UNITS TABS tablet Take 1,000 Units by mouth daily.    [provider]  cyanocobalamin (,VITAMIN B-12,) 1000 MCG/ML injection Inject 1,000 mcg into the muscle every 30 (thirty) days. 11/26/20   [provider]  enoxaparin (LOVENOX) 40 MG/0.4ML injection Inject 0.4 mLs (40 mg total) into the skin daily for 14 days. 01/15/21 01/29/21  Reche Dixon, PA-C  memantine (NAMENDA) 5 MG tablet Take 5 mg by mouth 2 (two) times daily. 08/05/20   [provider]  oxyCODONE (OXY IR/ROXICODONE) 5 MG immediate release tablet Take 1 tablet (5 mg total) by mouth every 6 (six) hours as needed for moderate pain (pain score 4-6). 01/15/21   Reche Dixon, PA-C  pantoprazole (PROTONIX) 40 MG tablet Take 1 tablet (40 mg total) by mouth daily. 01/18/21   Sidney Ace, MD  PARoxetine (PAXIL) 10 MG tablet Take 10 mg by mouth 2 (two) times daily. 01/02/21   [provider]  potassium  chloride (KLOR-CON) 10 MEQ tablet Take 10 mEq by mouth daily. 11/20/20   [provider]  rivastigmine (EXELON) 3 MG capsule Take 3 mg by mouth 2 (two) times daily. 01/02/21   [provider]  senna-docusate (SENOKOT-S) 8.6-50 MG tablet Take 1 tablet by mouth 2 (two) times daily. 01/17/21   Sreenath, Sudheer B, MD  sodium chloride 1 g tablet Take 1 tablet (1 g total) by mouth 3 (three) times daily. 09/17/18   Schuyler Amor, MD  SYNTHROID 88 MCG tablet Take 88 mcg by mouth every  morning. 01/02/21   [provider]  traMADol (ULTRAM) 50 MG tablet Take 1-2 tablets (50-100 mg total) by mouth every 6 (six) hours as needed for moderate pain. 01/15/21   Reche Dixon, PA-C    Allergies Simvastatin, Venlafaxine, Ace inhibitors, Amlodipine besylate, Calcitonin (salmon), Ciprofloxacin, Doxazosin mesylate, Hydrocod polst-cpm polst er, Raloxifene, Risedronate sodium, Codeine, and Singulair [montelukast]  Family History  Problem Relation Age of Onset  . Heart attack Brother     Social History Social History   Tobacco Use  . Smoking status: Former Research scientist (life sciences)  . Smokeless tobacco: Never Used  Substance Use Topics  . Alcohol use: No    Review of Systems Constitutional: No fever/chills Eyes: No visual changes. ENT: No sore throat. Cardiovascular: Denies chest pain. Respiratory: Denies shortness of breath. Gastrointestinal: No abdominal pain.  No nausea, no vomiting.  No diarrhea.  No constipation. Genitourinary: Negative for dysuria. Musculoskeletal: Right lower leg pain. Skin: Negative for rash. Neurological: Negative for headaches, focal weakness or numbness. Psychiatric:  Anxiety, pression, and dementia Endocrine:  Hyperlipidemia, hypertension, and hypothyroidism. Hematological/Lymphatic:  Allergic/Immunilogical: See extensive med allergy list. ____________________________________________   PHYSICAL EXAM:  VITAL SIGNS: ED Triage Vitals  Enc Vitals Group     BP 02/09/21 1022 (!) 146/78     Pulse Rate 02/09/21 1022 72     Resp 02/09/21 1024 19     Temp 02/09/21 1024 98.3 F (36.8 C)     Temp Source 02/09/21 1024 Oral     SpO2 02/09/21 1022 95 %     Weight 02/09/21 1022 164 lb (74.4 kg)     Height 02/09/21 1022 5\' 6"  (1.676 m)     Head Circumference --      [facility] --      Pain Score 02/09/21 1021 0     Pain Loc --      Pain Edu? --      Excl. in [place]? --    Constitutional: Alert and oriented. Well appearing and in no acute distress. Eyes:  Conjunctivae are normal. PERRL. EOMI. Head: Atraumatic. Nose: No congestion/rhinnorhea. Mouth/Throat: Mucous membranes are moist.  Oropharynx non-erythematous. Neck: No stridor. No cervical spine tenderness to palpation. Hematological/Lymphatic/Immunilogical: No cervical lymphadenopathy. Cardiovascular: Normal rate, regular rhythm. Grossly normal heart sounds.  Good peripheral circulation.  Blood pressure 146/78. Respiratory: Normal respiratory effort.  No retractions. Lungs CTAB. Gastrointestinal: Soft and nontender. No distention. No abdominal bruits. No CVA tenderness. Genitourinary: Deferred Musculoskeletal: Patient wearing short leg cast on the right lower extremity. Neurologic:  Normal speech and language. No gross focal neurologic deficits are appreciated. No gait instability. Skin:  Skin is warm, dry and intact. No rash noted. Psychiatric: Mood and affect are normal. Speech and behavior are normal.  ____________________________________________   LABS (all labs ordered are listed, but only abnormal results are displayed)  Labs Reviewed - No data to display ____________________________________________  EKG   ____________________________________________  RADIOLOGY Brendolyn Patty  Tamala Julian, personally viewed and evaluated these images (plain radiographs) as part of my medical decision making, as well as reviewing the written report by the radiologist.  ED MD interpretation: No acute findings in comparison to x-rays taken on 01/11/2021.  Official radiology report(s): DG Ankle Complete Right  Result Date: 02/09/2021 CLINICAL DATA:  ORIF ankle fractures EXAM: RIGHT ANKLE - COMPLETE 3+ VIEW COMPARISON:  01/11/2021 FINDINGS: Fiberglass cast material obscures bone detail. Two cannulated screws across a reduced medial malleolar fracture. Plate and 6 screws across a reduced distal RIGHT fibular diaphyseal fracture. Joint spaces preserved. No additional fracture or dislocation. Large plantar  calcaneal spur. IMPRESSION: Prior medial malleolar and distal fibular ORIF. No acute osseous abnormalities. Electronically Signed   By: Lavonia Dana M.D.   On: 02/09/2021 11:15   CT Head Wo Contrast  Result Date: 02/09/2021 CLINICAL DATA:  Fall.  Head injury.  Dementia. EXAM: CT HEAD WITHOUT CONTRAST TECHNIQUE: Contiguous axial images were obtained from the base of the skull through the vertex without intravenous contrast. COMPARISON:  01/11/2021 FINDINGS: Brain: Generalized atrophy. Moderate white matter hypodensity diffusely and bilaterally without interval change Negative for acute infarct, hemorrhage, mass. Vascular: Negative for hyperdense vessel Skull: Negative for skull fracture Sinuses/Orbits: Negative Other: None IMPRESSION: No acute abnormality no change from the recent CT. Electronically Signed   By: Franchot Gallo M.D.   On: 02/09/2021 11:59    ____________________________________________   PROCEDURES  Procedure(s) performed (including Critical Care):  Procedures   ____________________________________________   INITIAL IMPRESSION / ASSESSMENT AND PLAN / ED COURSE  As part of my medical decision making, I reviewed the following data within the [place] to ED for evaluation of right ankle fracture secondary to fall this morning.  Discussed no acute findings in comparison to previous x-rays taken a month ago.  Patient given discharge care instructions and advised follow-up PCP.      ____________________________________________   FINAL CLINICAL IMPRESSION(S) / ED DIAGNOSES  Final diagnoses:  Fall, initial encounter     ED Discharge Orders    None      *Please note:  Bethany Hicks was evaluated in Emergency Department on 02/09/2021 for the symptoms described in the history of present illness. She was evaluated in the context of the global COVID-19 pandemic, which necessitated consideration that the patient might be at risk for  infection with the SARS-CoV-2 virus that causes COVID-19. Institutional protocols and algorithms that pertain to the evaluation of patients at risk for COVID-19 are in a state of rapid change based on information released by regulatory bodies including the CDC and federal and state organizations. These policies and algorithms were followed during the patient's care in the ED.  Some ED evaluations and interventions may be delayed as a result of limited staffing during and the pandemic.*   Note:  This document was prepared using Dragon voice recognition software and may include unintentional dictation errors.    Sable Feil, PA-C 02/09/21 1218    Vanessa [place], MD 02/10/21 [phone]

## 2021-02-09 NOTE — Discharge Instructions (Addendum)
No acute findings on CT of the head and right ankle.  You are at increased risk for falls secondary to having a cast applied for your ankle fracture.  Advise assisted ambulation as needed.  Your blood pressure is in normal range at 146/78.

## 2021-02-15 DIAGNOSIS — E782 Mixed hyperlipidemia: Secondary | ICD-10-CM | POA: Diagnosis not present

## 2021-02-15 DIAGNOSIS — E538 Deficiency of other specified B group vitamins: Secondary | ICD-10-CM | POA: Diagnosis not present

## 2021-02-16 DIAGNOSIS — I10 Essential (primary) hypertension: Secondary | ICD-10-CM | POA: Diagnosis not present

## 2021-02-16 DIAGNOSIS — D519 Vitamin B12 deficiency anemia, unspecified: Secondary | ICD-10-CM | POA: Diagnosis not present

## 2021-02-16 DIAGNOSIS — M8588 Other specified disorders of bone density and structure, other site: Secondary | ICD-10-CM | POA: Diagnosis not present

## 2021-02-16 DIAGNOSIS — R32 Unspecified urinary incontinence: Secondary | ICD-10-CM | POA: Diagnosis not present

## 2021-02-16 DIAGNOSIS — G301 Alzheimer's disease with late onset: Secondary | ICD-10-CM | POA: Diagnosis not present

## 2021-02-16 DIAGNOSIS — G309 Alzheimer's disease, unspecified: Secondary | ICD-10-CM | POA: Diagnosis not present

## 2021-02-16 DIAGNOSIS — F0280 Dementia in other diseases classified elsewhere without behavioral disturbance: Secondary | ICD-10-CM | POA: Diagnosis not present

## 2021-02-16 DIAGNOSIS — Z79899 Other long term (current) drug therapy: Secondary | ICD-10-CM | POA: Diagnosis not present

## 2021-02-16 DIAGNOSIS — E039 Hypothyroidism, unspecified: Secondary | ICD-10-CM | POA: Diagnosis not present

## 2021-02-16 DIAGNOSIS — Z7982 Long term (current) use of aspirin: Secondary | ICD-10-CM | POA: Diagnosis not present

## 2021-02-16 DIAGNOSIS — Z87891 Personal history of nicotine dependence: Secondary | ICD-10-CM | POA: Diagnosis not present

## 2021-02-16 DIAGNOSIS — E7849 Other hyperlipidemia: Secondary | ICD-10-CM | POA: Diagnosis not present

## 2021-02-16 DIAGNOSIS — G9340 Encephalopathy, unspecified: Secondary | ICD-10-CM | POA: Diagnosis not present

## 2021-02-16 DIAGNOSIS — M818 Other osteoporosis without current pathological fracture: Secondary | ICD-10-CM | POA: Diagnosis not present

## 2021-02-16 DIAGNOSIS — K5900 Constipation, unspecified: Secondary | ICD-10-CM | POA: Diagnosis not present

## 2021-02-16 DIAGNOSIS — F0150 Vascular dementia without behavioral disturbance: Secondary | ICD-10-CM | POA: Diagnosis not present

## 2021-02-16 DIAGNOSIS — F419 Anxiety disorder, unspecified: Secondary | ICD-10-CM | POA: Diagnosis not present

## 2021-02-16 DIAGNOSIS — Z993 Dependence on wheelchair: Secondary | ICD-10-CM | POA: Diagnosis not present

## 2021-02-16 DIAGNOSIS — E569 Vitamin deficiency, unspecified: Secondary | ICD-10-CM | POA: Diagnosis not present

## 2021-02-16 DIAGNOSIS — M84471D Pathological fracture, right ankle, subsequent encounter for fracture with routine healing: Secondary | ICD-10-CM | POA: Diagnosis not present

## 2021-02-16 DIAGNOSIS — Z86718 Personal history of other venous thrombosis and embolism: Secondary | ICD-10-CM | POA: Diagnosis not present

## 2021-02-16 DIAGNOSIS — Z9181 History of falling: Secondary | ICD-10-CM | POA: Diagnosis not present

## 2021-02-16 DIAGNOSIS — F32A Depression, unspecified: Secondary | ICD-10-CM | POA: Diagnosis not present

## 2021-02-16 DIAGNOSIS — K219 Gastro-esophageal reflux disease without esophagitis: Secondary | ICD-10-CM | POA: Diagnosis not present

## 2021-02-20 DIAGNOSIS — Z7982 Long term (current) use of aspirin: Secondary | ICD-10-CM | POA: Diagnosis not present

## 2021-02-20 DIAGNOSIS — Z9181 History of falling: Secondary | ICD-10-CM | POA: Diagnosis not present

## 2021-02-20 DIAGNOSIS — F0150 Vascular dementia without behavioral disturbance: Secondary | ICD-10-CM | POA: Diagnosis not present

## 2021-02-20 DIAGNOSIS — Z993 Dependence on wheelchair: Secondary | ICD-10-CM | POA: Diagnosis not present

## 2021-02-20 DIAGNOSIS — K5900 Constipation, unspecified: Secondary | ICD-10-CM | POA: Diagnosis not present

## 2021-02-20 DIAGNOSIS — D519 Vitamin B12 deficiency anemia, unspecified: Secondary | ICD-10-CM | POA: Diagnosis not present

## 2021-02-20 DIAGNOSIS — F0280 Dementia in other diseases classified elsewhere without behavioral disturbance: Secondary | ICD-10-CM | POA: Diagnosis not present

## 2021-02-20 DIAGNOSIS — F32A Depression, unspecified: Secondary | ICD-10-CM | POA: Diagnosis not present

## 2021-02-20 DIAGNOSIS — G301 Alzheimer's disease with late onset: Secondary | ICD-10-CM | POA: Diagnosis not present

## 2021-02-20 DIAGNOSIS — E039 Hypothyroidism, unspecified: Secondary | ICD-10-CM | POA: Diagnosis not present

## 2021-02-20 DIAGNOSIS — F419 Anxiety disorder, unspecified: Secondary | ICD-10-CM | POA: Diagnosis not present

## 2021-02-20 DIAGNOSIS — Z86718 Personal history of other venous thrombosis and embolism: Secondary | ICD-10-CM | POA: Diagnosis not present

## 2021-02-20 DIAGNOSIS — K219 Gastro-esophageal reflux disease without esophagitis: Secondary | ICD-10-CM | POA: Diagnosis not present

## 2021-02-20 DIAGNOSIS — I10 Essential (primary) hypertension: Secondary | ICD-10-CM | POA: Diagnosis not present

## 2021-02-20 DIAGNOSIS — Z87891 Personal history of nicotine dependence: Secondary | ICD-10-CM | POA: Diagnosis not present

## 2021-02-20 DIAGNOSIS — Z79899 Other long term (current) drug therapy: Secondary | ICD-10-CM | POA: Diagnosis not present

## 2021-02-20 DIAGNOSIS — E569 Vitamin deficiency, unspecified: Secondary | ICD-10-CM | POA: Diagnosis not present

## 2021-02-20 DIAGNOSIS — M84471D Pathological fracture, right ankle, subsequent encounter for fracture with routine healing: Secondary | ICD-10-CM | POA: Diagnosis not present

## 2021-02-20 DIAGNOSIS — R32 Unspecified urinary incontinence: Secondary | ICD-10-CM | POA: Diagnosis not present

## 2021-02-20 DIAGNOSIS — E7849 Other hyperlipidemia: Secondary | ICD-10-CM | POA: Diagnosis not present

## 2021-02-22 DIAGNOSIS — K219 Gastro-esophageal reflux disease without esophagitis: Secondary | ICD-10-CM | POA: Diagnosis not present

## 2021-02-22 DIAGNOSIS — R32 Unspecified urinary incontinence: Secondary | ICD-10-CM | POA: Diagnosis not present

## 2021-02-22 DIAGNOSIS — E569 Vitamin deficiency, unspecified: Secondary | ICD-10-CM | POA: Diagnosis not present

## 2021-02-22 DIAGNOSIS — F419 Anxiety disorder, unspecified: Secondary | ICD-10-CM | POA: Diagnosis not present

## 2021-02-22 DIAGNOSIS — F32A Depression, unspecified: Secondary | ICD-10-CM | POA: Diagnosis not present

## 2021-02-22 DIAGNOSIS — M84471D Pathological fracture, right ankle, subsequent encounter for fracture with routine healing: Secondary | ICD-10-CM | POA: Diagnosis not present

## 2021-02-22 DIAGNOSIS — K5900 Constipation, unspecified: Secondary | ICD-10-CM | POA: Diagnosis not present

## 2021-02-22 DIAGNOSIS — Z7982 Long term (current) use of aspirin: Secondary | ICD-10-CM | POA: Diagnosis not present

## 2021-02-22 DIAGNOSIS — F0150 Vascular dementia without behavioral disturbance: Secondary | ICD-10-CM | POA: Diagnosis not present

## 2021-02-22 DIAGNOSIS — F0280 Dementia in other diseases classified elsewhere without behavioral disturbance: Secondary | ICD-10-CM | POA: Diagnosis not present

## 2021-02-22 DIAGNOSIS — E7849 Other hyperlipidemia: Secondary | ICD-10-CM | POA: Diagnosis not present

## 2021-02-22 DIAGNOSIS — G301 Alzheimer's disease with late onset: Secondary | ICD-10-CM | POA: Diagnosis not present

## 2021-02-22 DIAGNOSIS — Z87891 Personal history of nicotine dependence: Secondary | ICD-10-CM | POA: Diagnosis not present

## 2021-02-22 DIAGNOSIS — I10 Essential (primary) hypertension: Secondary | ICD-10-CM | POA: Diagnosis not present

## 2021-02-22 DIAGNOSIS — Z79899 Other long term (current) drug therapy: Secondary | ICD-10-CM | POA: Diagnosis not present

## 2021-02-22 DIAGNOSIS — Z9181 History of falling: Secondary | ICD-10-CM | POA: Diagnosis not present

## 2021-02-22 DIAGNOSIS — D519 Vitamin B12 deficiency anemia, unspecified: Secondary | ICD-10-CM | POA: Diagnosis not present

## 2021-02-22 DIAGNOSIS — Z86718 Personal history of other venous thrombosis and embolism: Secondary | ICD-10-CM | POA: Diagnosis not present

## 2021-02-22 DIAGNOSIS — Z993 Dependence on wheelchair: Secondary | ICD-10-CM | POA: Diagnosis not present

## 2021-02-22 DIAGNOSIS — E039 Hypothyroidism, unspecified: Secondary | ICD-10-CM | POA: Diagnosis not present

## 2021-02-27 DIAGNOSIS — S82891D Other fracture of right lower leg, subsequent encounter for closed fracture with routine healing: Secondary | ICD-10-CM | POA: Diagnosis not present

## 2021-02-27 DIAGNOSIS — Z741 Need for assistance with personal care: Secondary | ICD-10-CM | POA: Diagnosis not present

## 2021-03-09 DIAGNOSIS — Z8781 Personal history of (healed) traumatic fracture: Secondary | ICD-10-CM | POA: Diagnosis not present

## 2021-03-09 DIAGNOSIS — S82891D Other fracture of right lower leg, subsequent encounter for closed fracture with routine healing: Secondary | ICD-10-CM | POA: Diagnosis not present

## 2021-03-09 DIAGNOSIS — Z9889 Other specified postprocedural states: Secondary | ICD-10-CM | POA: Diagnosis not present

## 2021-03-10 DIAGNOSIS — G309 Alzheimer's disease, unspecified: Secondary | ICD-10-CM | POA: Diagnosis not present

## 2021-03-10 DIAGNOSIS — E538 Deficiency of other specified B group vitamins: Secondary | ICD-10-CM | POA: Diagnosis not present

## 2021-03-10 DIAGNOSIS — F0150 Vascular dementia without behavioral disturbance: Secondary | ICD-10-CM | POA: Diagnosis not present

## 2021-03-10 DIAGNOSIS — E782 Mixed hyperlipidemia: Secondary | ICD-10-CM | POA: Diagnosis not present

## 2021-03-10 DIAGNOSIS — F330 Major depressive disorder, recurrent, mild: Secondary | ICD-10-CM | POA: Diagnosis not present

## 2021-03-10 DIAGNOSIS — Z0000 Encounter for general adult medical examination without abnormal findings: Secondary | ICD-10-CM | POA: Diagnosis not present

## 2021-03-10 DIAGNOSIS — F0280 Dementia in other diseases classified elsewhere without behavioral disturbance: Secondary | ICD-10-CM | POA: Diagnosis not present

## 2021-03-10 DIAGNOSIS — E039 Hypothyroidism, unspecified: Secondary | ICD-10-CM | POA: Diagnosis not present

## 2021-03-27 DIAGNOSIS — D519 Vitamin B12 deficiency anemia, unspecified: Secondary | ICD-10-CM | POA: Diagnosis not present

## 2021-03-27 DIAGNOSIS — M84471D Pathological fracture, right ankle, subsequent encounter for fracture with routine healing: Secondary | ICD-10-CM | POA: Diagnosis not present

## 2021-03-27 DIAGNOSIS — F0150 Vascular dementia without behavioral disturbance: Secondary | ICD-10-CM | POA: Diagnosis not present

## 2021-03-27 DIAGNOSIS — F32A Depression, unspecified: Secondary | ICD-10-CM | POA: Diagnosis not present

## 2021-03-27 DIAGNOSIS — F0280 Dementia in other diseases classified elsewhere without behavioral disturbance: Secondary | ICD-10-CM | POA: Diagnosis not present

## 2021-03-27 DIAGNOSIS — Z7982 Long term (current) use of aspirin: Secondary | ICD-10-CM | POA: Diagnosis not present

## 2021-03-27 DIAGNOSIS — I10 Essential (primary) hypertension: Secondary | ICD-10-CM | POA: Diagnosis not present

## 2021-03-27 DIAGNOSIS — Z9181 History of falling: Secondary | ICD-10-CM | POA: Diagnosis not present

## 2021-03-27 DIAGNOSIS — G301 Alzheimer's disease with late onset: Secondary | ICD-10-CM | POA: Diagnosis not present

## 2021-03-27 DIAGNOSIS — K219 Gastro-esophageal reflux disease without esophagitis: Secondary | ICD-10-CM | POA: Diagnosis not present

## 2021-03-27 DIAGNOSIS — Z86718 Personal history of other venous thrombosis and embolism: Secondary | ICD-10-CM | POA: Diagnosis not present

## 2021-03-27 DIAGNOSIS — R32 Unspecified urinary incontinence: Secondary | ICD-10-CM | POA: Diagnosis not present

## 2021-03-27 DIAGNOSIS — E039 Hypothyroidism, unspecified: Secondary | ICD-10-CM | POA: Diagnosis not present

## 2021-03-27 DIAGNOSIS — Z993 Dependence on wheelchair: Secondary | ICD-10-CM | POA: Diagnosis not present

## 2021-03-27 DIAGNOSIS — F419 Anxiety disorder, unspecified: Secondary | ICD-10-CM | POA: Diagnosis not present

## 2021-03-27 DIAGNOSIS — E569 Vitamin deficiency, unspecified: Secondary | ICD-10-CM | POA: Diagnosis not present

## 2021-03-27 DIAGNOSIS — Z87891 Personal history of nicotine dependence: Secondary | ICD-10-CM | POA: Diagnosis not present

## 2021-03-27 DIAGNOSIS — K5900 Constipation, unspecified: Secondary | ICD-10-CM | POA: Diagnosis not present

## 2021-03-27 DIAGNOSIS — Z79899 Other long term (current) drug therapy: Secondary | ICD-10-CM | POA: Diagnosis not present

## 2021-03-27 DIAGNOSIS — E7849 Other hyperlipidemia: Secondary | ICD-10-CM | POA: Diagnosis not present

## 2021-04-03 DIAGNOSIS — Z741 Need for assistance with personal care: Secondary | ICD-10-CM | POA: Diagnosis not present

## 2021-04-03 DIAGNOSIS — S82891D Other fracture of right lower leg, subsequent encounter for closed fracture with routine healing: Secondary | ICD-10-CM | POA: Diagnosis not present

## 2021-04-20 DIAGNOSIS — M25571 Pain in right ankle and joints of right foot: Secondary | ICD-10-CM | POA: Diagnosis not present

## 2021-04-27 ENCOUNTER — Ambulatory Visit: Payer: PPO | Admitting: Dermatology

## 2021-04-27 ENCOUNTER — Encounter: Payer: Self-pay | Admitting: Dermatology

## 2021-04-27 ENCOUNTER — Other Ambulatory Visit: Payer: Self-pay

## 2021-04-27 DIAGNOSIS — D229 Melanocytic nevi, unspecified: Secondary | ICD-10-CM | POA: Diagnosis not present

## 2021-04-27 DIAGNOSIS — L821 Other seborrheic keratosis: Secondary | ICD-10-CM | POA: Diagnosis not present

## 2021-04-27 DIAGNOSIS — L820 Inflamed seborrheic keratosis: Secondary | ICD-10-CM

## 2021-04-27 DIAGNOSIS — Z85828 Personal history of other malignant neoplasm of skin: Secondary | ICD-10-CM | POA: Diagnosis not present

## 2021-04-27 DIAGNOSIS — L814 Other melanin hyperpigmentation: Secondary | ICD-10-CM

## 2021-04-27 DIAGNOSIS — D1800 Hemangioma unspecified site: Secondary | ICD-10-CM

## 2021-04-27 DIAGNOSIS — Z1283 Encounter for screening for malignant neoplasm of skin: Secondary | ICD-10-CM | POA: Diagnosis not present

## 2021-04-27 DIAGNOSIS — D692 Other nonthrombocytopenic purpura: Secondary | ICD-10-CM

## 2021-04-27 DIAGNOSIS — L578 Other skin changes due to chronic exposure to nonionizing radiation: Secondary | ICD-10-CM

## 2021-04-27 DIAGNOSIS — L219 Seborrheic dermatitis, unspecified: Secondary | ICD-10-CM

## 2021-04-27 MED ORDER — KETOCONAZOLE 2 % EX SHAM: 1.0000 "application " | MEDICATED_SHAMPOO | CUTANEOUS | 6 refills | Status: DC

## 2021-04-27 MED ORDER — FLUOCINONIDE 0.05 % EX SOLN: 1.0000 "application " | CUTANEOUS | 2 refills | Status: DC

## 2021-04-27 NOTE — Patient Instructions (Signed)

## 2021-04-27 NOTE — Progress Notes (Signed)
New Patient Visit  Subjective  Bethany Hicks is a 87 y.o. female who presents for the following: check spots (R ear itchy/Scalp itchy/R forehead itchy) and Total body skin exam (Hx of BCC L central forehead 2010).  She has growths all over her body, some of which she picks at and they bleed.  Patient accompanied by daughter who contributes to history  The following portions of the chart were reviewed this encounter and updated as appropriate:        Review of Systems:  No other skin or systemic complaints except as noted in HPI or Assessment and Plan.  Objective  Well appearing patient in no apparent distress; mood and affect are within normal limits.  A full examination was performed including scalp, head, eyes, ears, nose, lips, neck, chest, axillae, abdomen, back, buttocks, bilateral upper extremities, bilateral lower extremities, hands, feet, fingers, toes, fingernails, and toenails. All findings within normal limits unless otherwise noted below.  L med knee x 1, L hand x 1, R forehead x 1, R temple x 1, R ear helix x 1, L mid back x 1, R back x 1, R flank x 2, L post shoulder x 5, R chest x 4, L chest x 1, L forearm x 1, R forearm x 1, R thigh x 2, L med thigh x 3, L hip x 2, R jaw x 2,Total = 30 (30) Erythematous keratotic or waxy stuck-on papule or plaque.   Scalp Scaly plaque with matted hairs L frontal scalp, mild diffuse scaling R scalp   Assessment & Plan   Lentigines - Scattered tan macules - Due to sun exposure - Benign-appering, observe - Recommend daily broad spectrum sunscreen SPF 30+ to sun-exposed areas, reapply every 2 hours as needed. - Call for any changes  Seborrheic Keratoses - Stuck-on, waxy, tan-brown papules and/or plaques  - Benign-appearing - Discussed benign etiology and prognosis. - Observe - Call for any changes  Melanocytic Nevi - Tan-brown and/or pink-flesh-colored symmetric macules and papules - Benign appearing on exam today -  Observation - Call clinic for new or changing moles - Recommend daily use of broad spectrum spf 30+ sunscreen to sun-exposed areas.   Hemangiomas - Red papules - Discussed benign nature - Observe - Call for any changes  Purpura - Chronic; persistent and recurrent.  Treatable, but not curable. - Violaceous macules and patches - Benign - Related to trauma, age, sun damage and/or use of blood thinners, chronic use of topical and/or oral steroids - Observe - Can use OTC arnica containing moisturizer such as Dermend Bruise Formula if desired - Call for worsening or other concerns  Actinic Damage - Chronic condition, secondary to cumulative UV/sun exposure - diffuse scaly erythematous macules with underlying dyspigmentation - Recommend daily broad spectrum sunscreen SPF 30+ to sun-exposed areas, reapply every 2 hours as needed.  - Staying in the shade or wearing long sleeves, sun glasses (UVA+UVB protection) and wide brim hats (4-inch brim around the entire circumference of the hat) are also recommended for sun protection.  - Call for new or changing lesions.  Skin cancer screening performed today.  History of Basal Cell Carcinoma of the Skin - No evidence of recurrence today forehead - Recommend regular full body skin exams - Recommend daily broad spectrum sunscreen SPF 30+ to sun-exposed areas, reapply every 2 hours as needed.  - Call if any new or changing lesions are noted between office visits   - L central forehead 2010  Inflamed seborrheic  keratosis L med knee x 1, L hand x 1, R forehead x 1, R temple x 1, R ear helix x 1, L mid back x 1, R back x 1, R flank x 2, L post shoulder x 5, R chest x 4, L chest x 1, L forearm x 1, R forearm x 1, R thigh x 2, L med thigh x 3, L hip x 2, R jaw x 2,Total = 30  Destruction of lesion - L med knee x 1, L hand x 1, R forehead x 1, R temple x 1, R ear helix x 1, L mid back x 1, R back x 1, R flank x 2, L post shoulder x 5, R chest x 4, L chest  x 1, L forearm x 1, R forearm x 1, R thigh x 2, L med thigh x 3, L hip x 2, R jaw x 2,Total = 30  Destruction method: cryotherapy   Informed consent: discussed and consent obtained   Lesion destroyed using liquid nitrogen: Yes   Region frozen until ice ball extended beyond lesion: Yes   Outcome: patient tolerated procedure well with no complications   Post-procedure details: wound care instructions given   Additional details:  Prior to procedure, discussed risks of blister formation, small wound, skin dyspigmentation, or rare scar following cryotherapy. Recommend Vaseline ointment to treated areas while healing.   Seborrheic dermatitis Scalp  Seborrheic Dermatitis with pityriasis amiantacea component -  is a chronic persistent rash characterized by pinkness and scaling most commonly of the mid face but also can occur on the scalp (dandruff), ears; mid chest and mid back. It tends to be exacerbated by stress and cooler weather.  People who have neurologic disease may experience new onset or exacerbation of existing seborrheic dermatitis.  The condition is not curable but treatable and can be controlled.  Start Ketoconazole 2% shampoo 3x/wk, let sit 5 minutes and rinse out Start Mineral oil to aa L frontal scalp hs and cover with shower cap until plaque comes off Start Lidex solution 1-2 gtts qam aa plaque L frontal scalp   ketoconazole (NIZORAL) 2 % shampoo - Scalp Apply 1 application topically 3 (three) times a week. Let sit 5 minutes before rinsing out  fluocinonide (LIDEX) 0.05 % external solution - Scalp Apply 1 application topically as directed. Apply several gtts qd to bid to plaque on left frontal scalp until clear   Return in about 3 months (around 07/28/2021) for ISK f/u.  I, Othelia Pulling, RMA, am acting as scribe for Brendolyn Patty, MD .  Documentation: I have reviewed the above documentation for accuracy and completeness, and I agree with the above.  Brendolyn Patty MD

## 2021-05-08 DIAGNOSIS — R2689 Other abnormalities of gait and mobility: Secondary | ICD-10-CM | POA: Diagnosis not present

## 2021-05-08 DIAGNOSIS — G309 Alzheimer's disease, unspecified: Secondary | ICD-10-CM | POA: Diagnosis not present

## 2021-05-08 DIAGNOSIS — F0280 Dementia in other diseases classified elsewhere without behavioral disturbance: Secondary | ICD-10-CM | POA: Diagnosis not present

## 2021-05-08 DIAGNOSIS — G939 Disorder of brain, unspecified: Secondary | ICD-10-CM | POA: Diagnosis not present

## 2021-05-08 DIAGNOSIS — E538 Deficiency of other specified B group vitamins: Secondary | ICD-10-CM | POA: Diagnosis not present

## 2021-05-08 DIAGNOSIS — F0150 Vascular dementia without behavioral disturbance: Secondary | ICD-10-CM | POA: Diagnosis not present

## 2021-06-24 DIAGNOSIS — R601 Generalized edema: Secondary | ICD-10-CM | POA: Diagnosis not present

## 2021-06-27 DIAGNOSIS — Z111 Encounter for screening for respiratory tuberculosis: Secondary | ICD-10-CM | POA: Diagnosis not present

## 2021-07-13 DIAGNOSIS — I10 Essential (primary) hypertension: Secondary | ICD-10-CM | POA: Diagnosis not present

## 2021-07-13 DIAGNOSIS — R0602 Shortness of breath: Secondary | ICD-10-CM | POA: Diagnosis not present

## 2021-07-13 DIAGNOSIS — E559 Vitamin D deficiency, unspecified: Secondary | ICD-10-CM | POA: Diagnosis not present

## 2021-07-13 DIAGNOSIS — E785 Hyperlipidemia, unspecified: Secondary | ICD-10-CM | POA: Diagnosis not present

## 2021-07-13 DIAGNOSIS — K5900 Constipation, unspecified: Secondary | ICD-10-CM | POA: Diagnosis not present

## 2021-07-13 DIAGNOSIS — E039 Hypothyroidism, unspecified: Secondary | ICD-10-CM | POA: Diagnosis not present

## 2021-07-13 DIAGNOSIS — R5381 Other malaise: Secondary | ICD-10-CM | POA: Diagnosis not present

## 2021-07-17 DIAGNOSIS — Z7982 Long term (current) use of aspirin: Secondary | ICD-10-CM | POA: Diagnosis not present

## 2021-07-17 DIAGNOSIS — E782 Mixed hyperlipidemia: Secondary | ICD-10-CM | POA: Diagnosis not present

## 2021-07-17 DIAGNOSIS — D518 Other vitamin B12 deficiency anemias: Secondary | ICD-10-CM | POA: Diagnosis not present

## 2021-07-17 DIAGNOSIS — I10 Essential (primary) hypertension: Secondary | ICD-10-CM | POA: Diagnosis not present

## 2021-07-17 DIAGNOSIS — G309 Alzheimer's disease, unspecified: Secondary | ICD-10-CM | POA: Diagnosis not present

## 2021-07-17 DIAGNOSIS — E039 Hypothyroidism, unspecified: Secondary | ICD-10-CM | POA: Diagnosis not present

## 2021-07-17 DIAGNOSIS — R54 Age-related physical debility: Secondary | ICD-10-CM | POA: Diagnosis not present

## 2021-07-17 DIAGNOSIS — Z79899 Other long term (current) drug therapy: Secondary | ICD-10-CM | POA: Diagnosis not present

## 2021-07-17 DIAGNOSIS — E119 Type 2 diabetes mellitus without complications: Secondary | ICD-10-CM | POA: Diagnosis not present

## 2021-07-17 DIAGNOSIS — D519 Vitamin B12 deficiency anemia, unspecified: Secondary | ICD-10-CM | POA: Diagnosis not present

## 2021-07-17 DIAGNOSIS — F0150 Vascular dementia without behavioral disturbance: Secondary | ICD-10-CM | POA: Diagnosis not present

## 2021-07-17 DIAGNOSIS — Z9181 History of falling: Secondary | ICD-10-CM | POA: Diagnosis not present

## 2021-07-20 DIAGNOSIS — R600 Localized edema: Secondary | ICD-10-CM | POA: Diagnosis not present

## 2021-07-20 DIAGNOSIS — E785 Hyperlipidemia, unspecified: Secondary | ICD-10-CM | POA: Diagnosis not present

## 2021-07-20 DIAGNOSIS — E559 Vitamin D deficiency, unspecified: Secondary | ICD-10-CM | POA: Diagnosis not present

## 2021-07-20 DIAGNOSIS — K5900 Constipation, unspecified: Secondary | ICD-10-CM | POA: Diagnosis not present

## 2021-07-20 DIAGNOSIS — E039 Hypothyroidism, unspecified: Secondary | ICD-10-CM | POA: Diagnosis not present

## 2021-07-20 DIAGNOSIS — I10 Essential (primary) hypertension: Secondary | ICD-10-CM | POA: Diagnosis not present

## 2021-07-20 DIAGNOSIS — R5381 Other malaise: Secondary | ICD-10-CM | POA: Diagnosis not present

## 2021-07-20 DIAGNOSIS — F0390 Unspecified dementia without behavioral disturbance: Secondary | ICD-10-CM | POA: Diagnosis not present

## 2021-07-24 DIAGNOSIS — R601 Generalized edema: Secondary | ICD-10-CM | POA: Diagnosis not present

## 2021-07-26 DIAGNOSIS — I10 Essential (primary) hypertension: Secondary | ICD-10-CM | POA: Diagnosis not present

## 2021-07-26 DIAGNOSIS — E038 Other specified hypothyroidism: Secondary | ICD-10-CM | POA: Diagnosis not present

## 2021-07-26 DIAGNOSIS — E559 Vitamin D deficiency, unspecified: Secondary | ICD-10-CM | POA: Diagnosis not present

## 2021-07-26 DIAGNOSIS — E785 Hyperlipidemia, unspecified: Secondary | ICD-10-CM | POA: Diagnosis not present

## 2021-07-26 DIAGNOSIS — D518 Other vitamin B12 deficiency anemias: Secondary | ICD-10-CM | POA: Diagnosis not present

## 2021-07-26 DIAGNOSIS — E119 Type 2 diabetes mellitus without complications: Secondary | ICD-10-CM | POA: Diagnosis not present

## 2021-07-26 DIAGNOSIS — E039 Hypothyroidism, unspecified: Secondary | ICD-10-CM | POA: Diagnosis not present

## 2021-07-27 DIAGNOSIS — R600 Localized edema: Secondary | ICD-10-CM | POA: Diagnosis not present

## 2021-07-27 DIAGNOSIS — I10 Essential (primary) hypertension: Secondary | ICD-10-CM | POA: Diagnosis not present

## 2021-07-27 DIAGNOSIS — F32A Depression, unspecified: Secondary | ICD-10-CM | POA: Diagnosis not present

## 2021-07-27 DIAGNOSIS — E785 Hyperlipidemia, unspecified: Secondary | ICD-10-CM | POA: Diagnosis not present

## 2021-07-27 DIAGNOSIS — E559 Vitamin D deficiency, unspecified: Secondary | ICD-10-CM | POA: Diagnosis not present

## 2021-07-27 DIAGNOSIS — E039 Hypothyroidism, unspecified: Secondary | ICD-10-CM | POA: Diagnosis not present

## 2021-07-27 DIAGNOSIS — R5381 Other malaise: Secondary | ICD-10-CM | POA: Diagnosis not present

## 2021-07-27 DIAGNOSIS — K5900 Constipation, unspecified: Secondary | ICD-10-CM | POA: Diagnosis not present

## 2021-07-28 DIAGNOSIS — I10 Essential (primary) hypertension: Secondary | ICD-10-CM | POA: Diagnosis not present

## 2021-07-28 DIAGNOSIS — Z7982 Long term (current) use of aspirin: Secondary | ICD-10-CM | POA: Diagnosis not present

## 2021-07-28 DIAGNOSIS — E039 Hypothyroidism, unspecified: Secondary | ICD-10-CM | POA: Diagnosis not present

## 2021-07-28 DIAGNOSIS — G309 Alzheimer's disease, unspecified: Secondary | ICD-10-CM | POA: Diagnosis not present

## 2021-07-28 DIAGNOSIS — Z9181 History of falling: Secondary | ICD-10-CM | POA: Diagnosis not present

## 2021-07-28 DIAGNOSIS — D519 Vitamin B12 deficiency anemia, unspecified: Secondary | ICD-10-CM | POA: Diagnosis not present

## 2021-07-28 DIAGNOSIS — F0150 Vascular dementia without behavioral disturbance: Secondary | ICD-10-CM | POA: Diagnosis not present

## 2021-07-28 DIAGNOSIS — R54 Age-related physical debility: Secondary | ICD-10-CM | POA: Diagnosis not present

## 2021-07-31 DIAGNOSIS — D518 Other vitamin B12 deficiency anemias: Secondary | ICD-10-CM | POA: Diagnosis not present

## 2021-07-31 DIAGNOSIS — E119 Type 2 diabetes mellitus without complications: Secondary | ICD-10-CM | POA: Diagnosis not present

## 2021-07-31 DIAGNOSIS — E7849 Other hyperlipidemia: Secondary | ICD-10-CM | POA: Diagnosis not present

## 2021-07-31 DIAGNOSIS — Z79899 Other long term (current) drug therapy: Secondary | ICD-10-CM | POA: Diagnosis not present

## 2021-08-03 DIAGNOSIS — E039 Hypothyroidism, unspecified: Secondary | ICD-10-CM | POA: Diagnosis not present

## 2021-08-03 DIAGNOSIS — E559 Vitamin D deficiency, unspecified: Secondary | ICD-10-CM | POA: Diagnosis not present

## 2021-08-03 DIAGNOSIS — R600 Localized edema: Secondary | ICD-10-CM | POA: Diagnosis not present

## 2021-08-03 DIAGNOSIS — E785 Hyperlipidemia, unspecified: Secondary | ICD-10-CM | POA: Diagnosis not present

## 2021-08-03 DIAGNOSIS — R5381 Other malaise: Secondary | ICD-10-CM | POA: Diagnosis not present

## 2021-08-03 DIAGNOSIS — I10 Essential (primary) hypertension: Secondary | ICD-10-CM | POA: Diagnosis not present

## 2021-08-03 DIAGNOSIS — K5900 Constipation, unspecified: Secondary | ICD-10-CM | POA: Diagnosis not present

## 2021-08-10 DIAGNOSIS — E559 Vitamin D deficiency, unspecified: Secondary | ICD-10-CM | POA: Diagnosis not present

## 2021-08-10 DIAGNOSIS — R5381 Other malaise: Secondary | ICD-10-CM | POA: Diagnosis not present

## 2021-08-10 DIAGNOSIS — E785 Hyperlipidemia, unspecified: Secondary | ICD-10-CM | POA: Diagnosis not present

## 2021-08-10 DIAGNOSIS — E039 Hypothyroidism, unspecified: Secondary | ICD-10-CM | POA: Diagnosis not present

## 2021-08-10 DIAGNOSIS — I10 Essential (primary) hypertension: Secondary | ICD-10-CM | POA: Diagnosis not present

## 2021-08-10 DIAGNOSIS — R600 Localized edema: Secondary | ICD-10-CM | POA: Diagnosis not present

## 2021-08-10 DIAGNOSIS — K5900 Constipation, unspecified: Secondary | ICD-10-CM | POA: Diagnosis not present

## 2021-08-10 DIAGNOSIS — E871 Hypo-osmolality and hyponatremia: Secondary | ICD-10-CM | POA: Diagnosis not present

## 2021-08-14 ENCOUNTER — Ambulatory Visit: Payer: PPO | Admitting: Dermatology

## 2021-08-14 DIAGNOSIS — D518 Other vitamin B12 deficiency anemias: Secondary | ICD-10-CM | POA: Diagnosis not present

## 2021-08-14 DIAGNOSIS — E7849 Other hyperlipidemia: Secondary | ICD-10-CM | POA: Diagnosis not present

## 2021-08-14 DIAGNOSIS — E119 Type 2 diabetes mellitus without complications: Secondary | ICD-10-CM | POA: Diagnosis not present

## 2021-08-14 DIAGNOSIS — Z79899 Other long term (current) drug therapy: Secondary | ICD-10-CM | POA: Diagnosis not present

## 2021-08-16 DIAGNOSIS — I10 Essential (primary) hypertension: Secondary | ICD-10-CM | POA: Diagnosis not present

## 2021-08-23 DIAGNOSIS — E559 Vitamin D deficiency, unspecified: Secondary | ICD-10-CM | POA: Diagnosis not present

## 2021-08-23 DIAGNOSIS — R54 Age-related physical debility: Secondary | ICD-10-CM | POA: Diagnosis not present

## 2021-08-23 DIAGNOSIS — D518 Other vitamin B12 deficiency anemias: Secondary | ICD-10-CM | POA: Diagnosis not present

## 2021-08-23 DIAGNOSIS — I10 Essential (primary) hypertension: Secondary | ICD-10-CM | POA: Diagnosis not present

## 2021-08-23 DIAGNOSIS — E039 Hypothyroidism, unspecified: Secondary | ICD-10-CM | POA: Diagnosis not present

## 2021-08-23 DIAGNOSIS — E785 Hyperlipidemia, unspecified: Secondary | ICD-10-CM | POA: Diagnosis not present

## 2021-08-23 DIAGNOSIS — G309 Alzheimer's disease, unspecified: Secondary | ICD-10-CM | POA: Diagnosis not present

## 2021-08-23 DIAGNOSIS — F0150 Vascular dementia without behavioral disturbance: Secondary | ICD-10-CM | POA: Diagnosis not present

## 2021-08-23 DIAGNOSIS — D519 Vitamin B12 deficiency anemia, unspecified: Secondary | ICD-10-CM | POA: Diagnosis not present

## 2021-08-23 DIAGNOSIS — E119 Type 2 diabetes mellitus without complications: Secondary | ICD-10-CM | POA: Diagnosis not present

## 2021-08-23 DIAGNOSIS — Z9181 History of falling: Secondary | ICD-10-CM | POA: Diagnosis not present

## 2021-08-23 DIAGNOSIS — Z7982 Long term (current) use of aspirin: Secondary | ICD-10-CM | POA: Diagnosis not present

## 2021-08-23 DIAGNOSIS — E038 Other specified hypothyroidism: Secondary | ICD-10-CM | POA: Diagnosis not present

## 2021-09-13 DIAGNOSIS — K5900 Constipation, unspecified: Secondary | ICD-10-CM | POA: Diagnosis not present

## 2021-09-13 DIAGNOSIS — E039 Hypothyroidism, unspecified: Secondary | ICD-10-CM | POA: Diagnosis not present

## 2021-09-13 DIAGNOSIS — I10 Essential (primary) hypertension: Secondary | ICD-10-CM | POA: Diagnosis not present

## 2021-09-13 DIAGNOSIS — R5381 Other malaise: Secondary | ICD-10-CM | POA: Diagnosis not present

## 2021-09-13 DIAGNOSIS — E871 Hypo-osmolality and hyponatremia: Secondary | ICD-10-CM | POA: Diagnosis not present

## 2021-09-13 DIAGNOSIS — R600 Localized edema: Secondary | ICD-10-CM | POA: Diagnosis not present

## 2021-09-13 DIAGNOSIS — E559 Vitamin D deficiency, unspecified: Secondary | ICD-10-CM | POA: Diagnosis not present

## 2021-09-18 DIAGNOSIS — I10 Essential (primary) hypertension: Secondary | ICD-10-CM | POA: Diagnosis not present

## 2021-09-27 DIAGNOSIS — F05 Delirium due to known physiological condition: Secondary | ICD-10-CM | POA: Diagnosis not present

## 2021-09-27 DIAGNOSIS — E038 Other specified hypothyroidism: Secondary | ICD-10-CM | POA: Diagnosis not present

## 2021-09-27 DIAGNOSIS — E559 Vitamin D deficiency, unspecified: Secondary | ICD-10-CM | POA: Diagnosis not present

## 2021-09-27 DIAGNOSIS — I10 Essential (primary) hypertension: Secondary | ICD-10-CM | POA: Diagnosis not present

## 2021-09-27 DIAGNOSIS — E785 Hyperlipidemia, unspecified: Secondary | ICD-10-CM | POA: Diagnosis not present

## 2021-09-27 DIAGNOSIS — D518 Other vitamin B12 deficiency anemias: Secondary | ICD-10-CM | POA: Diagnosis not present

## 2021-09-27 DIAGNOSIS — E119 Type 2 diabetes mellitus without complications: Secondary | ICD-10-CM | POA: Diagnosis not present

## 2021-09-27 DIAGNOSIS — E039 Hypothyroidism, unspecified: Secondary | ICD-10-CM | POA: Diagnosis not present

## 2021-10-05 DIAGNOSIS — R600 Localized edema: Secondary | ICD-10-CM | POA: Diagnosis not present

## 2021-10-05 DIAGNOSIS — F419 Anxiety disorder, unspecified: Secondary | ICD-10-CM | POA: Diagnosis not present

## 2021-10-05 DIAGNOSIS — E559 Vitamin D deficiency, unspecified: Secondary | ICD-10-CM | POA: Diagnosis not present

## 2021-10-05 DIAGNOSIS — S76112A Strain of left quadriceps muscle, fascia and tendon, initial encounter: Secondary | ICD-10-CM | POA: Diagnosis not present

## 2021-10-05 DIAGNOSIS — E785 Hyperlipidemia, unspecified: Secondary | ICD-10-CM | POA: Diagnosis not present

## 2021-10-05 DIAGNOSIS — I10 Essential (primary) hypertension: Secondary | ICD-10-CM | POA: Diagnosis not present

## 2021-10-05 DIAGNOSIS — E871 Hypo-osmolality and hyponatremia: Secondary | ICD-10-CM | POA: Diagnosis not present

## 2021-10-05 DIAGNOSIS — K5900 Constipation, unspecified: Secondary | ICD-10-CM | POA: Diagnosis not present

## 2021-10-05 DIAGNOSIS — R5381 Other malaise: Secondary | ICD-10-CM | POA: Diagnosis not present

## 2021-10-05 DIAGNOSIS — E039 Hypothyroidism, unspecified: Secondary | ICD-10-CM | POA: Diagnosis not present

## 2021-10-05 DIAGNOSIS — F05 Delirium due to known physiological condition: Secondary | ICD-10-CM | POA: Diagnosis not present

## 2021-10-05 DIAGNOSIS — F0390 Unspecified dementia without behavioral disturbance: Secondary | ICD-10-CM | POA: Diagnosis not present

## 2021-10-17 DIAGNOSIS — I10 Essential (primary) hypertension: Secondary | ICD-10-CM | POA: Diagnosis not present

## 2021-10-31 DIAGNOSIS — M79675 Pain in left toe(s): Secondary | ICD-10-CM | POA: Diagnosis not present

## 2021-10-31 DIAGNOSIS — B351 Tinea unguium: Secondary | ICD-10-CM | POA: Diagnosis not present

## 2021-10-31 DIAGNOSIS — R600 Localized edema: Secondary | ICD-10-CM | POA: Diagnosis not present

## 2021-10-31 DIAGNOSIS — L603 Nail dystrophy: Secondary | ICD-10-CM | POA: Diagnosis not present

## 2021-11-01 DIAGNOSIS — G5712 Meralgia paresthetica, left lower limb: Secondary | ICD-10-CM | POA: Diagnosis not present

## 2021-11-01 DIAGNOSIS — M79605 Pain in left leg: Secondary | ICD-10-CM | POA: Diagnosis not present

## 2021-11-01 DIAGNOSIS — M1612 Unilateral primary osteoarthritis, left hip: Secondary | ICD-10-CM | POA: Diagnosis not present

## 2021-11-01 DIAGNOSIS — M76892 Other specified enthesopathies of left lower limb, excluding foot: Secondary | ICD-10-CM | POA: Diagnosis not present

## 2021-11-08 DIAGNOSIS — D518 Other vitamin B12 deficiency anemias: Secondary | ICD-10-CM | POA: Diagnosis not present

## 2021-11-08 DIAGNOSIS — F05 Delirium due to known physiological condition: Secondary | ICD-10-CM | POA: Diagnosis not present

## 2021-11-08 DIAGNOSIS — E038 Other specified hypothyroidism: Secondary | ICD-10-CM | POA: Diagnosis not present

## 2021-11-08 DIAGNOSIS — I10 Essential (primary) hypertension: Secondary | ICD-10-CM | POA: Diagnosis not present

## 2021-11-08 DIAGNOSIS — E559 Vitamin D deficiency, unspecified: Secondary | ICD-10-CM | POA: Diagnosis not present

## 2021-11-08 DIAGNOSIS — E119 Type 2 diabetes mellitus without complications: Secondary | ICD-10-CM | POA: Diagnosis not present

## 2021-11-08 DIAGNOSIS — E039 Hypothyroidism, unspecified: Secondary | ICD-10-CM | POA: Diagnosis not present

## 2021-11-08 DIAGNOSIS — E785 Hyperlipidemia, unspecified: Secondary | ICD-10-CM | POA: Diagnosis not present

## 2021-11-20 ENCOUNTER — Ambulatory Visit: Payer: PPO | Admitting: Dermatology

## 2021-12-06 ENCOUNTER — Other Ambulatory Visit: Payer: Self-pay

## 2021-12-06 ENCOUNTER — Ambulatory Visit: Payer: PPO | Admitting: Dermatology

## 2021-12-06 DIAGNOSIS — L814 Other melanin hyperpigmentation: Secondary | ICD-10-CM | POA: Diagnosis not present

## 2021-12-06 DIAGNOSIS — L219 Seborrheic dermatitis, unspecified: Secondary | ICD-10-CM

## 2021-12-06 DIAGNOSIS — L820 Inflamed seborrheic keratosis: Secondary | ICD-10-CM | POA: Diagnosis not present

## 2021-12-06 DIAGNOSIS — L578 Other skin changes due to chronic exposure to nonionizing radiation: Secondary | ICD-10-CM

## 2021-12-06 DIAGNOSIS — L821 Other seborrheic keratosis: Secondary | ICD-10-CM | POA: Diagnosis not present

## 2021-12-06 DIAGNOSIS — S40211A Abrasion of right shoulder, initial encounter: Secondary | ICD-10-CM

## 2021-12-06 DIAGNOSIS — Z1283 Encounter for screening for malignant neoplasm of skin: Secondary | ICD-10-CM

## 2021-12-06 DIAGNOSIS — Z85828 Personal history of other malignant neoplasm of skin: Secondary | ICD-10-CM | POA: Diagnosis not present

## 2021-12-06 DIAGNOSIS — D1800 Hemangioma unspecified site: Secondary | ICD-10-CM

## 2021-12-06 DIAGNOSIS — L853 Xerosis cutis: Secondary | ICD-10-CM

## 2021-12-06 DIAGNOSIS — T148XXA Other injury of unspecified body region, initial encounter: Secondary | ICD-10-CM

## 2021-12-06 MED ORDER — MUPIROCIN 2 % EX OINT: TOPICAL_OINTMENT | CUTANEOUS | 1 refills | Status: DC

## 2021-12-06 NOTE — Progress Notes (Signed)
Follow-Up Visit   Subjective  Bethany Hicks is a 88 y.o. female who presents for the following: Annual Exam.  The patient presents for Total-Body Skin Exam (TBSE) for skin cancer screening and mole check.  The patient has spots, moles and lesions to be evaluated, some may be new or changing. She has a history of BCC of the left central forehead. She has seborrheic dermatitis of the scalp and uses fluocinonide solution to affected areas as needed. She has areas on her shoulders and arms and legs that she scratches and picks at that bleed.  She has a habit of picking off raised areas.  Patient accompanied by daughter.   The following portions of the chart were reviewed this encounter and updated as appropriate:       Review of Systems:  No other skin or systemic complaints except as noted in HPI or Assessment and Plan.  Objective  Well appearing patient in no apparent distress; mood and affect are within normal limits.  A full examination was performed including scalp, head, eyes, ears, nose, lips, neck, chest, axillae, abdomen, back, buttocks, bilateral upper extremities, bilateral lower extremities, hands, feet, fingers, toes, fingernails, and toenails. All findings within normal limits unless otherwise noted below.  L forearm x 1, R forearm x 2, L upper arm x 1, R lower neck x 1, R upper arm x 1, R chest x 1, R anterior thigh x 1, L lateral thigh x 1, post neck x 1, L lower back x 1, R calf x 1, R post thigh x 1 (11) Erythematous stuck-on, waxy papule  Right Shoulder Multiple pink crusted excoriations  arms, legs Diffuse scaling.  Scalp Pink scaliness    Assessment & Plan  Actinic Damage - chronic, secondary to cumulative UV radiation exposure/sun exposure over time - diffuse scaly erythematous macules with underlying dyspigmentation - Recommend daily broad spectrum sunscreen SPF 30+ to sun-exposed areas, reapply every 2 hours as needed.  - Recommend staying in the shade or  wearing long sleeves, sun glasses (UVA+UVB protection) and wide brim hats (4-inch brim around the entire circumference of the hat). - Call for new or changing lesions.  Skin cancer screening performed today.  History of Basal Cell Carcinoma of the Skin - No evidence of recurrence today of the left central forehead - Recommend regular full body skin exams - Recommend daily broad spectrum sunscreen SPF 30+ to sun-exposed areas, reapply every 2 hours as needed.  - Call if any new or changing lesions are noted between office visits  Seborrheic Keratoses - Stuck-on, waxy, tan-brown papules and/or plaques  - Benign-appearing - Discussed benign etiology and prognosis. - Observe - Call for any changes - Continue AmLactin daily to arms, legs  Hemangiomas - Red papules - Discussed benign nature - Observe - Call for any changes  Lentigines - Scattered tan macules - Due to sun exposure - Benign-appering, observe - Recommend daily broad spectrum sunscreen SPF 30+ to sun-exposed areas, reapply every 2 hours as needed. - Call for any changes  Inflamed seborrheic keratosis (11) L forearm x 1, R forearm x 2, L upper arm x 1, R lower neck x 1, R upper arm x 1, R chest x 1, R anterior thigh x 1, L lateral thigh x 1, post neck x 1, L lower back x 1, R calf x 1, R post thigh x 1  Destruction of lesion - L forearm x 1, R forearm x 2, L upper arm x 1, R  lower neck x 1, R upper arm x 1, R chest x 1, R anterior thigh x 1, L lateral thigh x 1, post neck x 1, L lower back x 1, R calf x 1, R post thigh x 1  Destruction method: cryotherapy   Informed consent: discussed and consent obtained   Lesion destroyed using liquid nitrogen: Yes   Region frozen until ice ball extended beyond lesion: Yes   Outcome: patient tolerated procedure well with no complications   Post-procedure details: wound care instructions given   Additional details:  Prior to procedure, discussed risks of blister formation, small  wound, skin dyspigmentation, or rare scar following cryotherapy. Recommend Vaseline ointment to treated areas while healing.   Excoriation Right Shoulder  Start mupirocin 2% ointment Apply to open areas twice daily until healed  Avoid picking/scratching.  Recommend mild soap and moisturizing cream 1-2 times daily.  Gentle skin care handout provided.    mupirocin ointment (BACTROBAN) 2 % - Right Shoulder Apply to open areas on arms twice daily until healed.  Xerosis cutis arms, legs  Recommend mild soap and moisturizing cream 1-2 times daily.  Gentle skin care handout provided.   Continue AmLactin moisturizer daily. Pt has.  Seborrheic dermatitis Scalp  Improved  Continue 2% ketoconazole shampoo 2-3x/wk as directed Continue Lidex scalp solution qd/bid to aas prn itchy scalp  Seborrheic Dermatitis  -  is a chronic persistent rash characterized by pinkness and scaling most commonly of the mid face but also can occur on the scalp (dandruff), ears; mid chest, mid back and groin.  It tends to be exacerbated by stress and cooler weather.  People who have neurologic disease may experience new onset or exacerbation of existing seborrheic dermatitis.  The condition is not curable but treatable and can be controlled.  Related Medications ketoconazole (NIZORAL) 2 % shampoo Apply 1 application topically 3 (three) times a week. Let sit 5 minutes before rinsing out  fluocinonide (LIDEX) 0.05 % external solution Apply 1 application topically as directed. Apply several gtts qd to bid to plaque on left frontal scalp until clear   Return in about 1 year (around 12/06/2022) for TBSE, Hx BCC.  IJamesetta Orleans, CMA, am acting as scribe for Brendolyn Patty, MD . Documentation: I have reviewed the above documentation for accuracy and completeness, and I agree with the above.  Brendolyn Patty MD

## 2021-12-06 NOTE — Patient Instructions (Addendum)
Cryotherapy Aftercare  Wash gently with soap and water everyday.   Apply Mupirocin ointment and Band-Aid daily until healed.   Mupirocin 2% Ointment - Apply to areas treated with cryotherapy once a day until healed. Avoid picking. (L forearm x 1, R forearm x 2, L upper arm x 1, R lower neck x 1, R upper arm x 1, R chest x 1, R anterior thigh x 1, L lateral thigh x 1, post neck x 1, L lower back x 1, R calf x 1, R posterior thigh x 1)  Apply mupirocin 2% ointment to open areas on upper arms, shoulders once a day until healed. May cover with bandage. Avoid picking.   Continue AmLactin moisturizer once a day.    Dry Skin Care  What causes dry skin?  Dry skin is common and results from inadequate moisture in the outer skin layers. Dry skin usually results from the excessive loss of moisture from the skin surface. This occurs due to two major factors: Normally the skin's oil glands deposit a layer of oil on the skin's surface. This layer of oil prevents the loss of moisture from the skin. Exposure to soaps, cleaners, solvents, and disinfectants removes this oily film, allowing water to escape. Water loss from the skin increases when the humidity is low. During winter months we spend a lot of time indoors where the air is heated. Heated air has very low humidity. This also contributes to dry skin.  A tendency for dry skin may accompany such disorders as eczema. Also, as people age, the number of functioning oil glands decreases, and the tendency toward dry skin can be a sensation of skin tightness when emerging from the shower.  How do I manage dry skin?  Humidify your environment. This can be accomplished by using a humidifier in your bedroom at night during winter months. Bathing can actually put moisture back into your skin if done right. Take the following steps while bathing to sooth dry skin: Avoid hot water, which only dries the skin and makes itching worse. Use warm water. Avoid  washcloths or extensive rubbing or scrubbing. Use mild soaps like unscented Dove, Oil of Olay, Cetaphil, Basis, or CeraVe. If you take baths rather than showers, rinse off soap residue with clean water before getting out of tub. Once out of the shower/tub, pat dry gently with a soft towel. Leave your skin damp. While still damp, apply any medicated ointment/cream you were prescribed to the affected areas. After you apply your medicated ointment/cream, then apply your moisturizer to your whole body.This is the most important step in dry skin care. If this is omitted, your skin will continue to be dry. The choice of moisturizer is also very important. In general, lotion will not provider enough moisture to severely dry skin because it is water based. You should use an ointment or cream. Moisturizers should also be unscented. Good choices include Vaseline (plain petrolatum), Aquaphor, Cetaphil, CeraVe, Vanicream, DML Forte, Aveeno moisture, or Eucerin Cream. Bath oils can be helpful, but do not replace the application of moisturizer after the bath. In addition, they make the tub slippery causing an increased risk for falls. Therefore, we do not recommend their use.  Seborrheic Keratosis  What causes seborrheic keratoses? Seborrheic keratoses are harmless, common skin growths that first appear during adult life.  As time goes by, more growths appear.  Some people may develop a large number of them.  Seborrheic keratoses appear on both covered and uncovered body parts.  They are not caused by sunlight.  The tendency to develop seborrheic keratoses can be inherited.  They vary in color from skin-colored to gray, brown, or even black.  They can be either smooth or have a rough, warty surface.   Seborrheic keratoses are superficial and look as if they were stuck on the skin.  Under the microscope this type of keratosis looks like layers upon layers of skin.  That is why at times the top layer may seem to fall  off, but the rest of the growth remains and re-grows.    Treatment Seborrheic keratoses do not need to be treated, but can easily be removed in the office.  Seborrheic keratoses often cause symptoms when they rub on clothing or jewelry.  Lesions can be in the way of shaving.  If they become inflamed, they can cause itching, soreness, or burning.  Removal of a seborrheic keratosis can be accomplished by freezing, burning, or surgery. If any spot bleeds, scabs, or grows rapidly, please return to have it checked, as these can be an indication of a skin cancer.  If You Need Anything After Your Visit  If you have any questions or concerns for your doctor, please call our main line at [phone] and press option 4 to reach your doctor's medical assistant. If no one answers, please leave a voicemail as directed and we will return your call as soon as possible. Messages left after 4 pm will be answered the following business day.   You may also send [place] a message via [place]. We typically respond to MyChart messages within 1-2 business days.  For prescription refills, please ask your pharmacy to contact our office. Our fax number is [phone].  If you have an urgent issue when the clinic is closed that cannot wait until the next business day, you can page your doctor at the number below.    Please note that while we do our best to be available for urgent issues outside of office hours, we are not available 24/7.   If you have an urgent issue and are unable to reach [place], you may choose to seek medical care at your doctor's office, retail clinic, urgent care center, or emergency room.  If you have a medical emergency, please immediately call 911 or go to the emergency department.  Pager Numbers  - Dr. Nehemiah Massed: [phone]  - Dr. Laurence Ferrari: [phone]  - Dr. Nicole Kindred: [phone]  In the event of inclement weather, please call our main line at [phone] for an update on the status of any  delays or closures.  Dermatology Medication Tips: Please keep the boxes that topical medications come in in order to help keep track of the instructions about where and how to use these. Pharmacies typically print the medication instructions only on the boxes and not directly on the medication tubes.   If your medication is too expensive, please contact our office at [phone] option 4 or send [place] a message through [place].   We are unable to tell what your co-pay for medications will be in advance as this is different depending on your insurance coverage. However, we may be able to find a substitute medication at lower cost or fill out paperwork to get insurance to cover a needed medication.   If a prior authorization is required to get your medication covered by your insurance company, please allow [place] 1-2 business days to complete this process.  Drug prices often vary depending on where the prescription is  filled and some pharmacies may offer cheaper prices.  The website www.goodrx.com contains coupons for medications through different pharmacies. The prices here do not account for what the cost may be with help from insurance (it may be cheaper with your insurance), but the website can give you the price if you did not use any insurance.  - You can print the associated coupon and take it with your prescription to the pharmacy.  - You may also stop by our office during regular business hours and pick up a GoodRx coupon card.  - If you need your prescription sent electronically to a different pharmacy, notify our office through [facility] or by phone at [phone] option 4.     Si Usted Necesita Algo Despus de Su Visita  Tambin puede enviarnos un mensaje a travs de Pharmacist, community. Por lo general respondemos a los mensajes de MyChart en el transcurso de 1 a 2 das hbiles.  Para renovar recetas, por favor pida a su farmacia que se ponga en contacto con nuestra oficina. Harland Dingwall de fax es [place] [phone].  Si tiene un asunto urgente cuando la clnica est cerrada y que no puede esperar hasta el siguiente da hbil, puede llamar/localizar a su doctor(a) al nmero que aparece a continuacin.   Por favor, tenga en cuenta que aunque hacemos todo lo posible para estar disponibles para asuntos urgentes fuera del horario de [place], no estamos disponibles las 24 horas del da, los 7 das de la [place].   Si tiene un problema urgente y no puede comunicarse con nosotros, puede optar por buscar atencin mdica  en el consultorio de su doctor(a), en una clnica privada, en un centro de atencin urgente o en una sala de emergencias.  Si tiene Engineering geologist, por favor llame inmediatamente al 911 o vaya a la sala de emergencias.  Nmeros de bper  - Dr. Nehemiah Massed: [phone]  - Dra. Moye: [phone]  - Dra. Nicole Kindred: [phone]  En caso de inclemencias del [place], por favor llame a Johnsie Kindred principal al [phone] para una actualizacin sobre el [place] de cualquier retraso o cierre.  Consejos para la medicacin en dermatologa: Por favor, guarde las cajas en las que vienen los medicamentos de uso tpico para ayudarle a seguir las instrucciones sobre dnde y cmo usarlos. Las farmacias generalmente imprimen las instrucciones del medicamento slo en las cajas y no directamente en los tubos del [place].   Si su medicamento es muy caro, por favor, pngase en contacto con Zigmund Daniel llamando al [phone] y presione la opcin 4 o envenos un mensaje a travs de Pharmacist, community.   No podemos decirle cul ser su copago por los medicamentos por adelantado ya que esto es diferente dependiendo de la cobertura de su seguro. Sin embargo, es posible que podamos encontrar un medicamento sustituto a Electrical engineer un formulario para que el seguro cubra el medicamento que se considera necesario.   Si se requiere una autorizacin previa para que su compaa de  seguros [place] su medicamento, por favor permtanos de 1 a 2 das hbiles para completar este proceso.  Los precios de los medicamentos varan con frecuencia dependiendo del Environmental consultant de dnde se surte la receta y alguna farmacias pueden ofrecer precios ms baratos.  El sitio web www.goodrx.com tiene cupones para medicamentos de Airline pilot. Los precios aqu no tienen en cuenta lo que podra costar con la ayuda del seguro (puede ser ms barato con su seguro), pero el sitio web puede darle  el precio si no Field seismologist.  - Puede imprimir el cupn correspondiente y llevarlo con su receta a la farmacia.  - Tambin puede pasar por nuestra oficina durante el horario de atencin regular y Charity fundraiser una tarjeta de cupones de GoodRx.  - Si necesita que su receta se enve electrnicamente a una farmacia diferente, informe a nuestra oficina a travs de MyChart de [place] o por telfono llamando al [phone] y presione la opcin 4.

## 2021-12-07 ENCOUNTER — Telehealth: Payer: Self-pay

## 2021-12-07 NOTE — Telephone Encounter (Signed)
-----   Message from Brendolyn Patty, MD sent at 12/06/2021  2:06 PM EST ----- Could you call her daughter and ask her if she needs the ketoconazole shampoo and fluocinonide scalp solution rfs sent in for her scalp, since we forgot to address today during her TBSE?  Her next appointment in is one year. Thanks

## 2021-12-07 NOTE — Telephone Encounter (Signed)
Called both daughters. No answer, unable to leave message. Does patient need refills of scalp medications? JP

## 2021-12-14 DIAGNOSIS — E538 Deficiency of other specified B group vitamins: Secondary | ICD-10-CM | POA: Diagnosis not present

## 2021-12-14 DIAGNOSIS — E782 Mixed hyperlipidemia: Secondary | ICD-10-CM | POA: Diagnosis not present

## 2021-12-18 DIAGNOSIS — E538 Deficiency of other specified B group vitamins: Secondary | ICD-10-CM | POA: Diagnosis not present

## 2021-12-18 DIAGNOSIS — E782 Mixed hyperlipidemia: Secondary | ICD-10-CM | POA: Diagnosis not present

## 2021-12-20 DIAGNOSIS — F0280 Dementia in other diseases classified elsewhere without behavioral disturbance: Secondary | ICD-10-CM | POA: Diagnosis not present

## 2021-12-20 DIAGNOSIS — Z0000 Encounter for general adult medical examination without abnormal findings: Secondary | ICD-10-CM | POA: Diagnosis not present

## 2021-12-20 DIAGNOSIS — F330 Major depressive disorder, recurrent, mild: Secondary | ICD-10-CM | POA: Diagnosis not present

## 2021-12-20 DIAGNOSIS — E782 Mixed hyperlipidemia: Secondary | ICD-10-CM | POA: Diagnosis not present

## 2021-12-20 DIAGNOSIS — E538 Deficiency of other specified B group vitamins: Secondary | ICD-10-CM | POA: Diagnosis not present

## 2021-12-20 DIAGNOSIS — G309 Alzheimer's disease, unspecified: Secondary | ICD-10-CM | POA: Diagnosis not present

## 2021-12-20 DIAGNOSIS — F0150 Vascular dementia without behavioral disturbance: Secondary | ICD-10-CM | POA: Diagnosis not present

## 2022-01-17 DIAGNOSIS — M79604 Pain in right leg: Secondary | ICD-10-CM | POA: Diagnosis not present

## 2022-01-17 DIAGNOSIS — M62838 Other muscle spasm: Secondary | ICD-10-CM | POA: Diagnosis not present

## 2022-01-17 DIAGNOSIS — M1611 Unilateral primary osteoarthritis, right hip: Secondary | ICD-10-CM | POA: Diagnosis not present

## 2022-03-13 DIAGNOSIS — R29898 Other symptoms and signs involving the musculoskeletal system: Secondary | ICD-10-CM | POA: Diagnosis not present

## 2022-03-13 DIAGNOSIS — M1711 Unilateral primary osteoarthritis, right knee: Secondary | ICD-10-CM | POA: Diagnosis not present

## 2022-03-13 DIAGNOSIS — M1611 Unilateral primary osteoarthritis, right hip: Secondary | ICD-10-CM | POA: Diagnosis not present

## 2022-03-13 DIAGNOSIS — M79604 Pain in right leg: Secondary | ICD-10-CM | POA: Diagnosis not present

## 2022-03-13 DIAGNOSIS — M76891 Other specified enthesopathies of right lower limb, excluding foot: Secondary | ICD-10-CM | POA: Diagnosis not present

## 2022-03-22 DIAGNOSIS — G939 Disorder of brain, unspecified: Secondary | ICD-10-CM | POA: Diagnosis not present

## 2022-03-22 DIAGNOSIS — R2689 Other abnormalities of gait and mobility: Secondary | ICD-10-CM | POA: Diagnosis not present

## 2022-03-22 DIAGNOSIS — F419 Anxiety disorder, unspecified: Secondary | ICD-10-CM | POA: Diagnosis not present

## 2022-03-22 DIAGNOSIS — F0150 Vascular dementia without behavioral disturbance: Secondary | ICD-10-CM | POA: Diagnosis not present

## 2022-03-22 DIAGNOSIS — R29898 Other symptoms and signs involving the musculoskeletal system: Secondary | ICD-10-CM | POA: Diagnosis not present

## 2022-03-22 DIAGNOSIS — G309 Alzheimer's disease, unspecified: Secondary | ICD-10-CM | POA: Diagnosis not present

## 2022-03-22 DIAGNOSIS — E538 Deficiency of other specified B group vitamins: Secondary | ICD-10-CM | POA: Diagnosis not present

## 2022-03-22 DIAGNOSIS — F0280 Dementia in other diseases classified elsewhere without behavioral disturbance: Secondary | ICD-10-CM | POA: Diagnosis not present

## 2022-03-23 ENCOUNTER — Other Ambulatory Visit: Payer: Self-pay | Admitting: Neurology

## 2022-03-23 DIAGNOSIS — R29898 Other symptoms and signs involving the musculoskeletal system: Secondary | ICD-10-CM

## 2022-03-27 DIAGNOSIS — M1711 Unilateral primary osteoarthritis, right knee: Secondary | ICD-10-CM | POA: Diagnosis not present

## 2022-03-27 DIAGNOSIS — M1611 Unilateral primary osteoarthritis, right hip: Secondary | ICD-10-CM | POA: Diagnosis not present

## 2022-03-27 DIAGNOSIS — M25551 Pain in right hip: Secondary | ICD-10-CM | POA: Diagnosis not present

## 2022-03-27 DIAGNOSIS — R262 Difficulty in walking, not elsewhere classified: Secondary | ICD-10-CM | POA: Diagnosis not present

## 2022-03-27 DIAGNOSIS — M25561 Pain in right knee: Secondary | ICD-10-CM | POA: Diagnosis not present

## 2022-03-28 DIAGNOSIS — M5116 Intervertebral disc disorders with radiculopathy, lumbar region: Secondary | ICD-10-CM | POA: Diagnosis not present

## 2022-03-28 DIAGNOSIS — R29898 Other symptoms and signs involving the musculoskeletal system: Secondary | ICD-10-CM | POA: Diagnosis not present

## 2022-03-30 DIAGNOSIS — M1611 Unilateral primary osteoarthritis, right hip: Secondary | ICD-10-CM | POA: Diagnosis not present

## 2022-03-30 DIAGNOSIS — M25551 Pain in right hip: Secondary | ICD-10-CM | POA: Diagnosis not present

## 2022-03-30 DIAGNOSIS — M25561 Pain in right knee: Secondary | ICD-10-CM | POA: Diagnosis not present

## 2022-03-30 DIAGNOSIS — R262 Difficulty in walking, not elsewhere classified: Secondary | ICD-10-CM | POA: Diagnosis not present

## 2022-03-30 DIAGNOSIS — M1711 Unilateral primary osteoarthritis, right knee: Secondary | ICD-10-CM | POA: Diagnosis not present

## 2022-04-05 DIAGNOSIS — R262 Difficulty in walking, not elsewhere classified: Secondary | ICD-10-CM | POA: Diagnosis not present

## 2022-04-05 DIAGNOSIS — M25551 Pain in right hip: Secondary | ICD-10-CM | POA: Diagnosis not present

## 2022-04-05 DIAGNOSIS — M25561 Pain in right knee: Secondary | ICD-10-CM | POA: Diagnosis not present

## 2022-04-05 DIAGNOSIS — M1711 Unilateral primary osteoarthritis, right knee: Secondary | ICD-10-CM | POA: Diagnosis not present

## 2022-04-05 DIAGNOSIS — M1611 Unilateral primary osteoarthritis, right hip: Secondary | ICD-10-CM | POA: Diagnosis not present

## 2022-04-06 ENCOUNTER — Ambulatory Visit: Payer: PPO

## 2022-04-11 DIAGNOSIS — M1711 Unilateral primary osteoarthritis, right knee: Secondary | ICD-10-CM | POA: Diagnosis not present

## 2022-04-11 DIAGNOSIS — M25561 Pain in right knee: Secondary | ICD-10-CM | POA: Diagnosis not present

## 2022-04-11 DIAGNOSIS — R262 Difficulty in walking, not elsewhere classified: Secondary | ICD-10-CM | POA: Diagnosis not present

## 2022-04-11 DIAGNOSIS — M25551 Pain in right hip: Secondary | ICD-10-CM | POA: Diagnosis not present

## 2022-04-11 DIAGNOSIS — M1611 Unilateral primary osteoarthritis, right hip: Secondary | ICD-10-CM | POA: Diagnosis not present

## 2022-04-12 ENCOUNTER — Ambulatory Visit
Admission: RE | Admit: 2022-04-12 | Discharge: 2022-04-12 | Disposition: A | Discharge status: Home / Self Care | Payer: PPO | Source: Ambulatory Visit | Attending: Neurology | Admitting: Neurology

## 2022-04-12 DIAGNOSIS — R531 Weakness: Secondary | ICD-10-CM | POA: Diagnosis not present

## 2022-04-12 DIAGNOSIS — R29898 Other symptoms and signs involving the musculoskeletal system: Secondary | ICD-10-CM

## 2022-04-13 DIAGNOSIS — M1711 Unilateral primary osteoarthritis, right knee: Secondary | ICD-10-CM | POA: Diagnosis not present

## 2022-04-13 DIAGNOSIS — M25551 Pain in right hip: Secondary | ICD-10-CM | POA: Diagnosis not present

## 2022-04-13 DIAGNOSIS — M25561 Pain in right knee: Secondary | ICD-10-CM | POA: Diagnosis not present

## 2022-04-13 DIAGNOSIS — M1611 Unilateral primary osteoarthritis, right hip: Secondary | ICD-10-CM | POA: Diagnosis not present

## 2022-04-13 DIAGNOSIS — R262 Difficulty in walking, not elsewhere classified: Secondary | ICD-10-CM | POA: Diagnosis not present

## 2022-04-17 DIAGNOSIS — M25551 Pain in right hip: Secondary | ICD-10-CM | POA: Diagnosis not present

## 2022-04-17 DIAGNOSIS — R262 Difficulty in walking, not elsewhere classified: Secondary | ICD-10-CM | POA: Diagnosis not present

## 2022-04-17 DIAGNOSIS — M1711 Unilateral primary osteoarthritis, right knee: Secondary | ICD-10-CM | POA: Diagnosis not present

## 2022-04-17 DIAGNOSIS — M25561 Pain in right knee: Secondary | ICD-10-CM | POA: Diagnosis not present

## 2022-04-17 DIAGNOSIS — M1611 Unilateral primary osteoarthritis, right hip: Secondary | ICD-10-CM | POA: Diagnosis not present

## 2022-04-19 DIAGNOSIS — M25551 Pain in right hip: Secondary | ICD-10-CM | POA: Diagnosis not present

## 2022-04-19 DIAGNOSIS — M1711 Unilateral primary osteoarthritis, right knee: Secondary | ICD-10-CM | POA: Diagnosis not present

## 2022-04-19 DIAGNOSIS — M1611 Unilateral primary osteoarthritis, right hip: Secondary | ICD-10-CM | POA: Diagnosis not present

## 2022-04-19 DIAGNOSIS — R262 Difficulty in walking, not elsewhere classified: Secondary | ICD-10-CM | POA: Diagnosis not present

## 2022-04-19 DIAGNOSIS — M25561 Pain in right knee: Secondary | ICD-10-CM | POA: Diagnosis not present

## 2022-04-27 DIAGNOSIS — M1611 Unilateral primary osteoarthritis, right hip: Secondary | ICD-10-CM | POA: Diagnosis not present

## 2022-04-27 DIAGNOSIS — M25551 Pain in right hip: Secondary | ICD-10-CM | POA: Diagnosis not present

## 2022-04-27 DIAGNOSIS — M25561 Pain in right knee: Secondary | ICD-10-CM | POA: Diagnosis not present

## 2022-04-27 DIAGNOSIS — M1711 Unilateral primary osteoarthritis, right knee: Secondary | ICD-10-CM | POA: Diagnosis not present

## 2022-04-27 DIAGNOSIS — R262 Difficulty in walking, not elsewhere classified: Secondary | ICD-10-CM | POA: Diagnosis not present

## 2022-04-30 DIAGNOSIS — M25551 Pain in right hip: Secondary | ICD-10-CM | POA: Diagnosis not present

## 2022-04-30 DIAGNOSIS — R262 Difficulty in walking, not elsewhere classified: Secondary | ICD-10-CM | POA: Diagnosis not present

## 2022-04-30 DIAGNOSIS — M25561 Pain in right knee: Secondary | ICD-10-CM | POA: Diagnosis not present

## 2022-04-30 DIAGNOSIS — M1611 Unilateral primary osteoarthritis, right hip: Secondary | ICD-10-CM | POA: Diagnosis not present

## 2022-04-30 DIAGNOSIS — M1711 Unilateral primary osteoarthritis, right knee: Secondary | ICD-10-CM | POA: Diagnosis not present

## 2022-05-03 DIAGNOSIS — S50311A Abrasion of right elbow, initial encounter: Secondary | ICD-10-CM | POA: Diagnosis not present

## 2022-05-03 DIAGNOSIS — Z885 Allergy status to narcotic agent status: Secondary | ICD-10-CM | POA: Diagnosis not present

## 2022-05-03 DIAGNOSIS — M47812 Spondylosis without myelopathy or radiculopathy, cervical region: Secondary | ICD-10-CM | POA: Diagnosis not present

## 2022-05-03 DIAGNOSIS — S59902A Unspecified injury of left elbow, initial encounter: Secondary | ICD-10-CM | POA: Diagnosis not present

## 2022-05-03 DIAGNOSIS — Z7982 Long term (current) use of aspirin: Secondary | ICD-10-CM | POA: Diagnosis not present

## 2022-05-03 DIAGNOSIS — Z881 Allergy status to other antibiotic agents status: Secondary | ICD-10-CM | POA: Diagnosis not present

## 2022-05-03 DIAGNOSIS — S199XXA Unspecified injury of neck, initial encounter: Secondary | ICD-10-CM | POA: Diagnosis not present

## 2022-05-03 DIAGNOSIS — S59901A Unspecified injury of right elbow, initial encounter: Secondary | ICD-10-CM | POA: Diagnosis not present

## 2022-05-03 DIAGNOSIS — S50312A Abrasion of left elbow, initial encounter: Secondary | ICD-10-CM | POA: Diagnosis not present

## 2022-05-03 DIAGNOSIS — S0091XA Abrasion of unspecified part of head, initial encounter: Secondary | ICD-10-CM | POA: Diagnosis not present

## 2022-05-03 DIAGNOSIS — S0990XA Unspecified injury of head, initial encounter: Secondary | ICD-10-CM | POA: Diagnosis not present

## 2022-05-03 DIAGNOSIS — S51011A Laceration without foreign body of right elbow, initial encounter: Secondary | ICD-10-CM | POA: Diagnosis not present

## 2022-05-03 DIAGNOSIS — Z888 Allergy status to other drugs, medicaments and biological substances status: Secondary | ICD-10-CM | POA: Diagnosis not present

## 2022-05-03 DIAGNOSIS — S51012A Laceration without foreign body of left elbow, initial encounter: Secondary | ICD-10-CM | POA: Diagnosis not present

## 2022-05-03 DIAGNOSIS — I10 Essential (primary) hypertension: Secondary | ICD-10-CM | POA: Diagnosis not present

## 2022-05-07 DIAGNOSIS — E538 Deficiency of other specified B group vitamins: Secondary | ICD-10-CM | POA: Diagnosis not present

## 2022-05-07 DIAGNOSIS — E782 Mixed hyperlipidemia: Secondary | ICD-10-CM | POA: Diagnosis not present

## 2022-05-08 DIAGNOSIS — R262 Difficulty in walking, not elsewhere classified: Secondary | ICD-10-CM | POA: Diagnosis not present

## 2022-05-08 DIAGNOSIS — M1711 Unilateral primary osteoarthritis, right knee: Secondary | ICD-10-CM | POA: Diagnosis not present

## 2022-05-08 DIAGNOSIS — M25561 Pain in right knee: Secondary | ICD-10-CM | POA: Diagnosis not present

## 2022-05-08 DIAGNOSIS — M1611 Unilateral primary osteoarthritis, right hip: Secondary | ICD-10-CM | POA: Diagnosis not present

## 2022-05-08 DIAGNOSIS — M25551 Pain in right hip: Secondary | ICD-10-CM | POA: Diagnosis not present

## 2022-05-10 DIAGNOSIS — M1711 Unilateral primary osteoarthritis, right knee: Secondary | ICD-10-CM | POA: Diagnosis not present

## 2022-05-10 DIAGNOSIS — M25561 Pain in right knee: Secondary | ICD-10-CM | POA: Diagnosis not present

## 2022-05-10 DIAGNOSIS — M1611 Unilateral primary osteoarthritis, right hip: Secondary | ICD-10-CM | POA: Diagnosis not present

## 2022-05-10 DIAGNOSIS — M25551 Pain in right hip: Secondary | ICD-10-CM | POA: Diagnosis not present

## 2022-05-10 DIAGNOSIS — R262 Difficulty in walking, not elsewhere classified: Secondary | ICD-10-CM | POA: Diagnosis not present

## 2022-05-15 DIAGNOSIS — R350 Frequency of micturition: Secondary | ICD-10-CM | POA: Diagnosis not present

## 2022-05-15 DIAGNOSIS — M25551 Pain in right hip: Secondary | ICD-10-CM | POA: Diagnosis not present

## 2022-05-15 DIAGNOSIS — R262 Difficulty in walking, not elsewhere classified: Secondary | ICD-10-CM | POA: Diagnosis not present

## 2022-05-15 DIAGNOSIS — M25561 Pain in right knee: Secondary | ICD-10-CM | POA: Diagnosis not present

## 2022-05-15 DIAGNOSIS — M1611 Unilateral primary osteoarthritis, right hip: Secondary | ICD-10-CM | POA: Diagnosis not present

## 2022-05-15 DIAGNOSIS — M1711 Unilateral primary osteoarthritis, right knee: Secondary | ICD-10-CM | POA: Diagnosis not present

## 2022-05-17 DIAGNOSIS — R262 Difficulty in walking, not elsewhere classified: Secondary | ICD-10-CM | POA: Diagnosis not present

## 2022-05-17 DIAGNOSIS — M25551 Pain in right hip: Secondary | ICD-10-CM | POA: Diagnosis not present

## 2022-05-17 DIAGNOSIS — M1711 Unilateral primary osteoarthritis, right knee: Secondary | ICD-10-CM | POA: Diagnosis not present

## 2022-05-17 DIAGNOSIS — M25561 Pain in right knee: Secondary | ICD-10-CM | POA: Diagnosis not present

## 2022-05-17 DIAGNOSIS — M1611 Unilateral primary osteoarthritis, right hip: Secondary | ICD-10-CM | POA: Diagnosis not present

## 2022-05-22 DIAGNOSIS — R262 Difficulty in walking, not elsewhere classified: Secondary | ICD-10-CM | POA: Diagnosis not present

## 2022-05-22 DIAGNOSIS — M1611 Unilateral primary osteoarthritis, right hip: Secondary | ICD-10-CM | POA: Diagnosis not present

## 2022-05-22 DIAGNOSIS — M25551 Pain in right hip: Secondary | ICD-10-CM | POA: Diagnosis not present

## 2022-05-22 DIAGNOSIS — M25561 Pain in right knee: Secondary | ICD-10-CM | POA: Diagnosis not present

## 2022-05-22 DIAGNOSIS — M1711 Unilateral primary osteoarthritis, right knee: Secondary | ICD-10-CM | POA: Diagnosis not present

## 2022-05-24 DIAGNOSIS — M25561 Pain in right knee: Secondary | ICD-10-CM | POA: Diagnosis not present

## 2022-05-24 DIAGNOSIS — M1611 Unilateral primary osteoarthritis, right hip: Secondary | ICD-10-CM | POA: Diagnosis not present

## 2022-05-24 DIAGNOSIS — M1711 Unilateral primary osteoarthritis, right knee: Secondary | ICD-10-CM | POA: Diagnosis not present

## 2022-05-24 DIAGNOSIS — M25551 Pain in right hip: Secondary | ICD-10-CM | POA: Diagnosis not present

## 2022-05-24 DIAGNOSIS — R262 Difficulty in walking, not elsewhere classified: Secondary | ICD-10-CM | POA: Diagnosis not present

## 2022-05-29 DIAGNOSIS — M1611 Unilateral primary osteoarthritis, right hip: Secondary | ICD-10-CM | POA: Diagnosis not present

## 2022-05-29 DIAGNOSIS — M25551 Pain in right hip: Secondary | ICD-10-CM | POA: Diagnosis not present

## 2022-05-29 DIAGNOSIS — M1711 Unilateral primary osteoarthritis, right knee: Secondary | ICD-10-CM | POA: Diagnosis not present

## 2022-05-29 DIAGNOSIS — R262 Difficulty in walking, not elsewhere classified: Secondary | ICD-10-CM | POA: Diagnosis not present

## 2022-05-29 DIAGNOSIS — M25561 Pain in right knee: Secondary | ICD-10-CM | POA: Diagnosis not present

## 2022-05-31 DIAGNOSIS — R262 Difficulty in walking, not elsewhere classified: Secondary | ICD-10-CM | POA: Diagnosis not present

## 2022-05-31 DIAGNOSIS — M25561 Pain in right knee: Secondary | ICD-10-CM | POA: Diagnosis not present

## 2022-05-31 DIAGNOSIS — M1711 Unilateral primary osteoarthritis, right knee: Secondary | ICD-10-CM | POA: Diagnosis not present

## 2022-05-31 DIAGNOSIS — M1611 Unilateral primary osteoarthritis, right hip: Secondary | ICD-10-CM | POA: Diagnosis not present

## 2022-05-31 DIAGNOSIS — M25551 Pain in right hip: Secondary | ICD-10-CM | POA: Diagnosis not present

## 2022-06-01 DIAGNOSIS — R300 Dysuria: Secondary | ICD-10-CM | POA: Diagnosis not present

## 2022-06-01 DIAGNOSIS — R4182 Altered mental status, unspecified: Secondary | ICD-10-CM | POA: Diagnosis not present

## 2022-06-05 DIAGNOSIS — R262 Difficulty in walking, not elsewhere classified: Secondary | ICD-10-CM | POA: Diagnosis not present

## 2022-06-05 DIAGNOSIS — M25551 Pain in right hip: Secondary | ICD-10-CM | POA: Diagnosis not present

## 2022-06-05 DIAGNOSIS — M1711 Unilateral primary osteoarthritis, right knee: Secondary | ICD-10-CM | POA: Diagnosis not present

## 2022-06-05 DIAGNOSIS — M1611 Unilateral primary osteoarthritis, right hip: Secondary | ICD-10-CM | POA: Diagnosis not present

## 2022-06-05 DIAGNOSIS — M25561 Pain in right knee: Secondary | ICD-10-CM | POA: Diagnosis not present

## 2022-06-08 DIAGNOSIS — H34231 Retinal artery branch occlusion, right eye: Secondary | ICD-10-CM | POA: Diagnosis not present

## 2022-06-08 DIAGNOSIS — D3131 Benign neoplasm of right choroid: Secondary | ICD-10-CM | POA: Diagnosis not present

## 2022-06-12 DIAGNOSIS — R262 Difficulty in walking, not elsewhere classified: Secondary | ICD-10-CM | POA: Diagnosis not present

## 2022-06-12 DIAGNOSIS — M1611 Unilateral primary osteoarthritis, right hip: Secondary | ICD-10-CM | POA: Diagnosis not present

## 2022-06-12 DIAGNOSIS — M25551 Pain in right hip: Secondary | ICD-10-CM | POA: Diagnosis not present

## 2022-06-12 DIAGNOSIS — M1711 Unilateral primary osteoarthritis, right knee: Secondary | ICD-10-CM | POA: Diagnosis not present

## 2022-06-12 DIAGNOSIS — M25561 Pain in right knee: Secondary | ICD-10-CM | POA: Diagnosis not present

## 2022-06-15 DIAGNOSIS — R300 Dysuria: Secondary | ICD-10-CM | POA: Diagnosis not present

## 2022-06-16 DIAGNOSIS — F0150 Vascular dementia without behavioral disturbance: Secondary | ICD-10-CM | POA: Diagnosis not present

## 2022-06-16 DIAGNOSIS — G301 Alzheimer's disease with late onset: Secondary | ICD-10-CM | POA: Diagnosis not present

## 2022-06-16 DIAGNOSIS — F419 Anxiety disorder, unspecified: Secondary | ICD-10-CM | POA: Diagnosis not present

## 2022-06-16 DIAGNOSIS — Z885 Allergy status to narcotic agent status: Secondary | ICD-10-CM | POA: Diagnosis not present

## 2022-06-16 DIAGNOSIS — E871 Hypo-osmolality and hyponatremia: Secondary | ICD-10-CM | POA: Diagnosis not present

## 2022-06-16 DIAGNOSIS — R260 Ataxic gait: Secondary | ICD-10-CM | POA: Diagnosis not present

## 2022-06-16 DIAGNOSIS — R509 Fever, unspecified: Secondary | ICD-10-CM | POA: Diagnosis not present

## 2022-06-16 DIAGNOSIS — R4182 Altered mental status, unspecified: Secondary | ICD-10-CM | POA: Diagnosis not present

## 2022-06-16 DIAGNOSIS — F02B0 Dementia in other diseases classified elsewhere, moderate, without behavioral disturbance, psychotic disturbance, mood disturbance, and anxiety: Secondary | ICD-10-CM | POA: Diagnosis not present

## 2022-06-16 DIAGNOSIS — U071 COVID-19: Secondary | ICD-10-CM | POA: Diagnosis not present

## 2022-06-16 DIAGNOSIS — Z888 Allergy status to other drugs, medicaments and biological substances status: Secondary | ICD-10-CM | POA: Diagnosis not present

## 2022-06-16 DIAGNOSIS — G9341 Metabolic encephalopathy: Secondary | ICD-10-CM | POA: Diagnosis not present

## 2022-06-16 DIAGNOSIS — Z87891 Personal history of nicotine dependence: Secondary | ICD-10-CM | POA: Diagnosis not present

## 2022-06-16 DIAGNOSIS — F03C18 Unspecified dementia, severe, with other behavioral disturbance: Secondary | ICD-10-CM | POA: Diagnosis not present

## 2022-06-16 DIAGNOSIS — R410 Disorientation, unspecified: Secondary | ICD-10-CM | POA: Diagnosis not present

## 2022-06-16 DIAGNOSIS — Z79899 Other long term (current) drug therapy: Secondary | ICD-10-CM | POA: Diagnosis not present

## 2022-06-16 DIAGNOSIS — R9089 Other abnormal findings on diagnostic imaging of central nervous system: Secondary | ICD-10-CM | POA: Diagnosis not present

## 2022-06-16 DIAGNOSIS — R059 Cough, unspecified: Secondary | ICD-10-CM | POA: Diagnosis not present

## 2022-06-16 DIAGNOSIS — E039 Hypothyroidism, unspecified: Secondary | ICD-10-CM | POA: Diagnosis not present

## 2022-06-16 DIAGNOSIS — F0390 Unspecified dementia without behavioral disturbance: Secondary | ICD-10-CM | POA: Diagnosis not present

## 2022-06-16 DIAGNOSIS — I10 Essential (primary) hypertension: Secondary | ICD-10-CM | POA: Diagnosis not present

## 2022-06-16 DIAGNOSIS — F32A Depression, unspecified: Secondary | ICD-10-CM | POA: Diagnosis not present

## 2022-06-16 DIAGNOSIS — Z881 Allergy status to other antibiotic agents status: Secondary | ICD-10-CM | POA: Diagnosis not present

## 2022-06-17 DIAGNOSIS — E871 Hypo-osmolality and hyponatremia: Secondary | ICD-10-CM | POA: Diagnosis not present

## 2022-06-17 DIAGNOSIS — F03C18 Unspecified dementia, severe, with other behavioral disturbance: Secondary | ICD-10-CM | POA: Diagnosis not present

## 2022-06-17 DIAGNOSIS — R410 Disorientation, unspecified: Secondary | ICD-10-CM | POA: Diagnosis not present

## 2022-06-17 DIAGNOSIS — U071 COVID-19: Secondary | ICD-10-CM | POA: Diagnosis not present

## 2022-06-17 DIAGNOSIS — E039 Hypothyroidism, unspecified: Secondary | ICD-10-CM | POA: Diagnosis not present

## 2022-06-18 DIAGNOSIS — E871 Hypo-osmolality and hyponatremia: Secondary | ICD-10-CM | POA: Diagnosis not present

## 2022-06-18 DIAGNOSIS — F03C18 Unspecified dementia, severe, with other behavioral disturbance: Secondary | ICD-10-CM | POA: Diagnosis not present

## 2022-06-18 DIAGNOSIS — U071 COVID-19: Secondary | ICD-10-CM | POA: Diagnosis not present

## 2022-06-18 DIAGNOSIS — E039 Hypothyroidism, unspecified: Secondary | ICD-10-CM | POA: Diagnosis not present

## 2022-06-18 DIAGNOSIS — R410 Disorientation, unspecified: Secondary | ICD-10-CM | POA: Diagnosis not present

## 2022-06-19 DIAGNOSIS — U071 COVID-19: Secondary | ICD-10-CM | POA: Diagnosis not present

## 2022-06-19 DIAGNOSIS — R410 Disorientation, unspecified: Secondary | ICD-10-CM | POA: Diagnosis not present

## 2022-06-19 DIAGNOSIS — F0390 Unspecified dementia without behavioral disturbance: Secondary | ICD-10-CM | POA: Diagnosis not present

## 2022-06-19 DIAGNOSIS — E871 Hypo-osmolality and hyponatremia: Secondary | ICD-10-CM | POA: Diagnosis not present

## 2022-06-26 IMAGING — CT CT HEAD W/O CM
3 series · 15 of 47 positions shown, 18 images · non-contrast
Comparison: MRI 03/02/2019

CLINICAL DATA: Head trauma hit back of head

EXAM:
CT HEAD WITHOUT CONTRAST
TECHNIQUE: Contiguous axial images were obtained from the base of the skull
through the vertex without intravenous contrast.

[Series 2: head wo · axial · 0.41mm/px · z∈[-2,+123]mm · 9 of 30 slices shown, 12 images]
[im 3/30  brain]
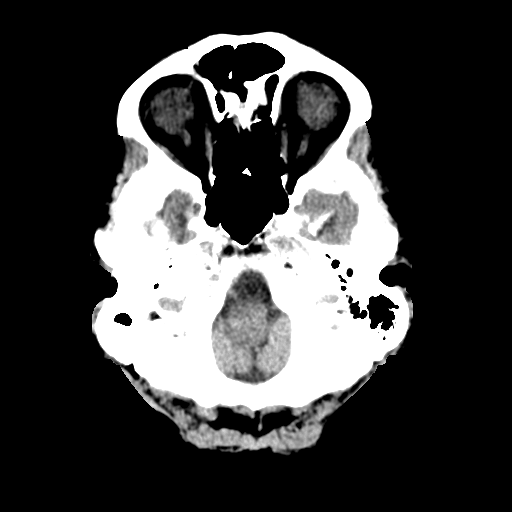
[im 3/30  bone]
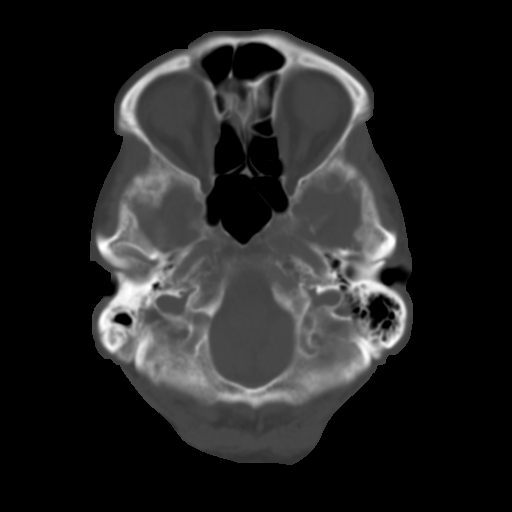
[im 6/30  brain]
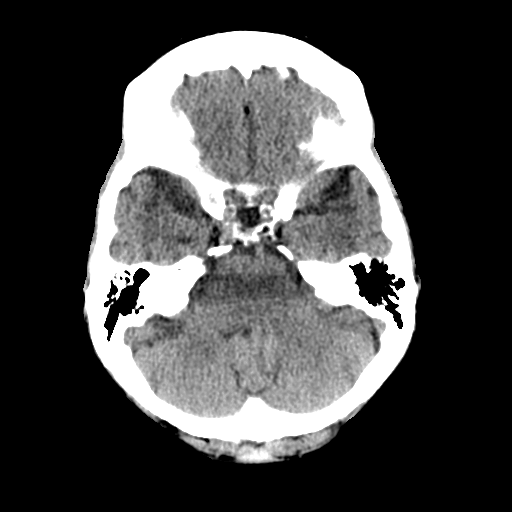
[im 9/30  brain]
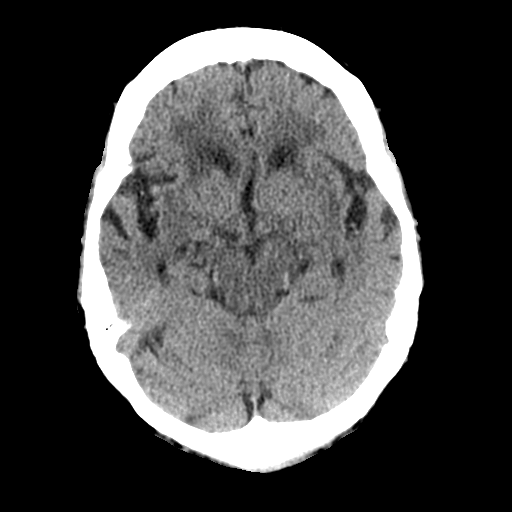
[im 12/30  brain]
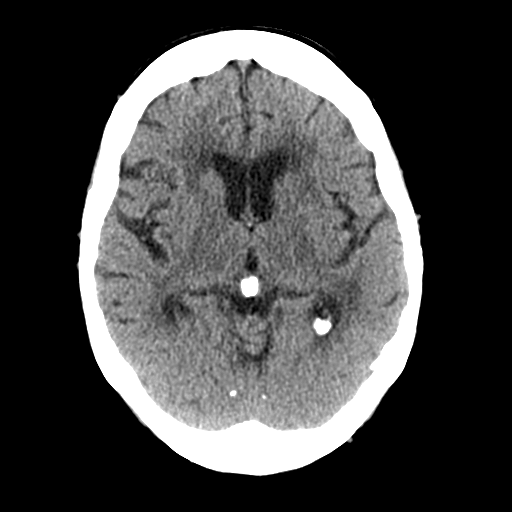
[im 16/30  brain]
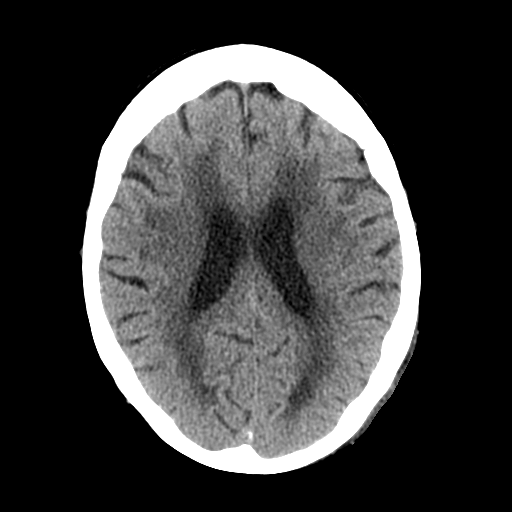
[im 16/30  bone]
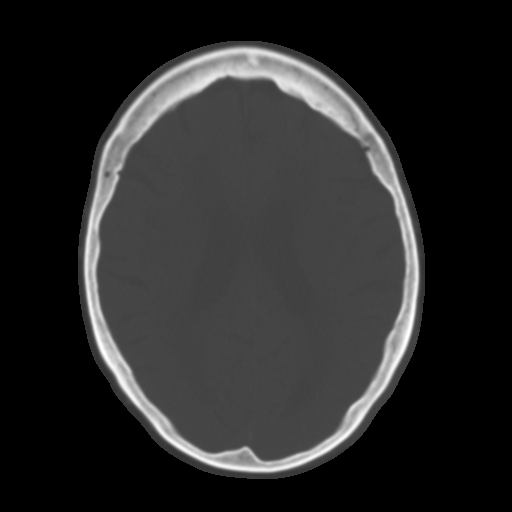
[im 19/30  brain]
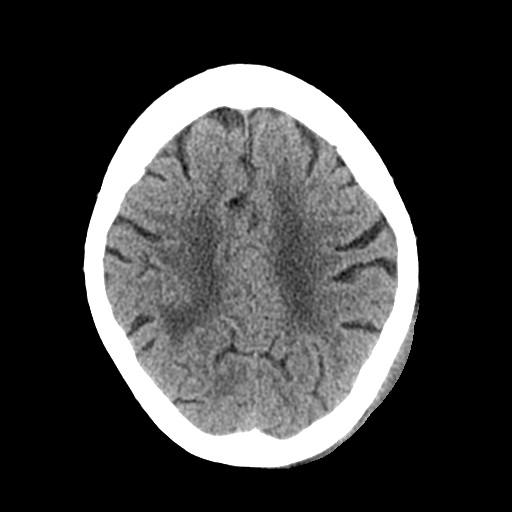
[im 22/30  brain]
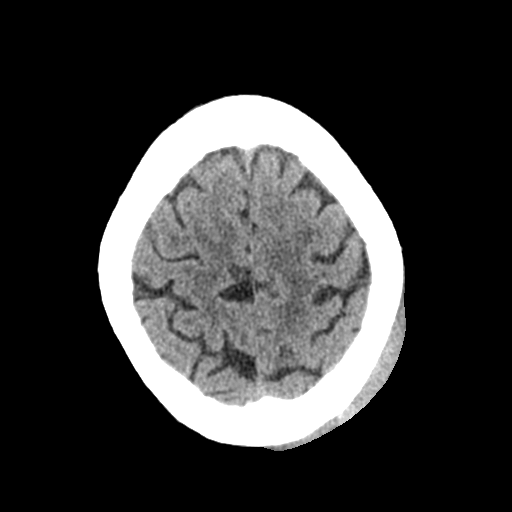
[im 25/30  brain]
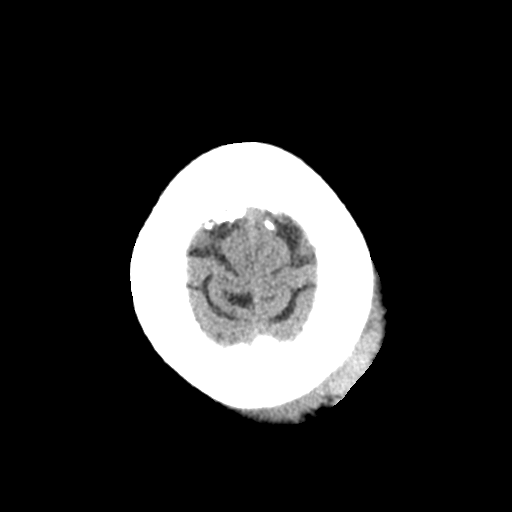
[im 28/30  brain]
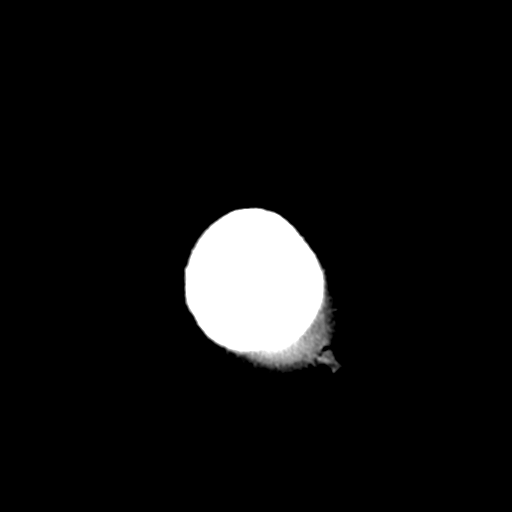
[im 28/30  bone]
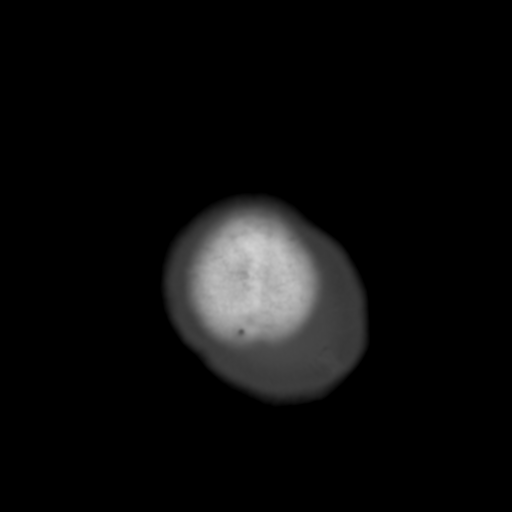

[Series 4: coronal soft tissue · coronal · 0.30mm/px · 3 of 65 slices shown]
[im 22/65  brain]
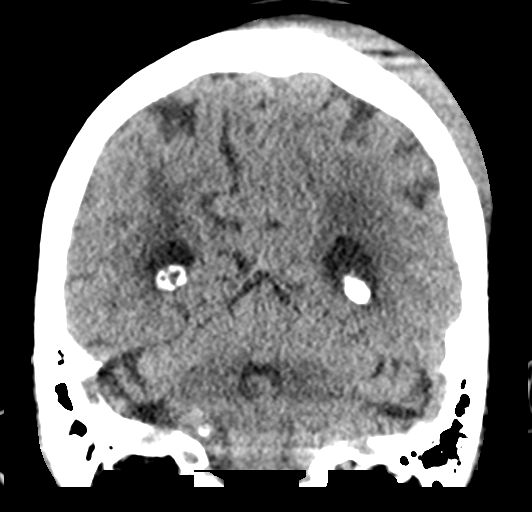
[im 29/65  brain]
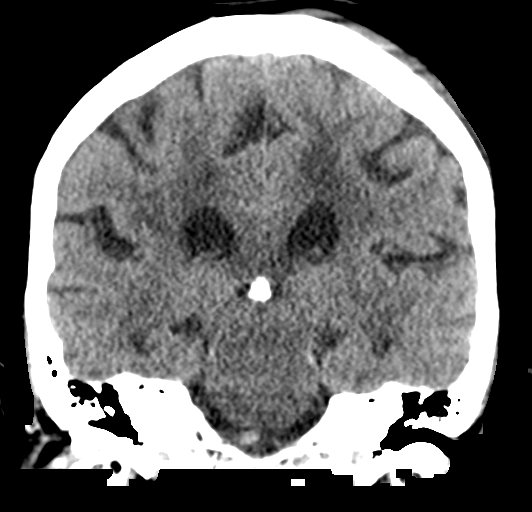
[im 36/65  brain]
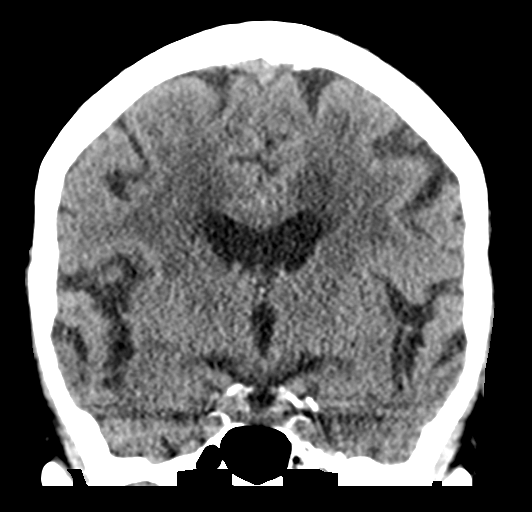

[Series 5: sagittal soft tissue · sagittal · 0.30mm/px · 3 of 50 slices shown]
[im 17/50  brain]
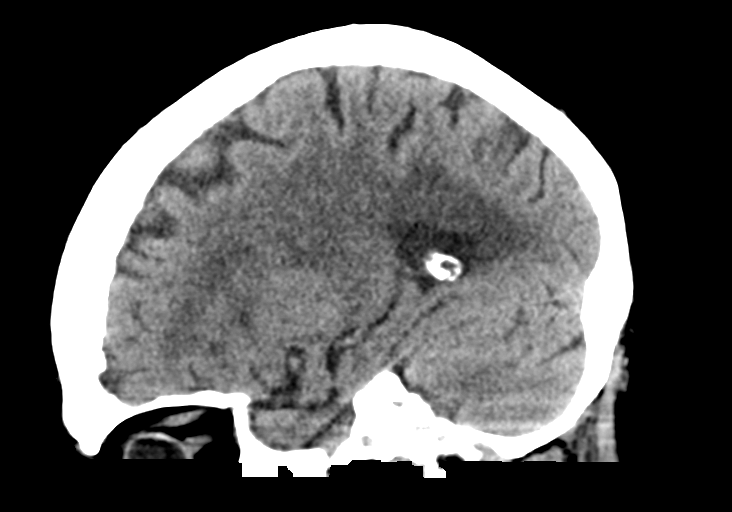
[im 25/50  brain]
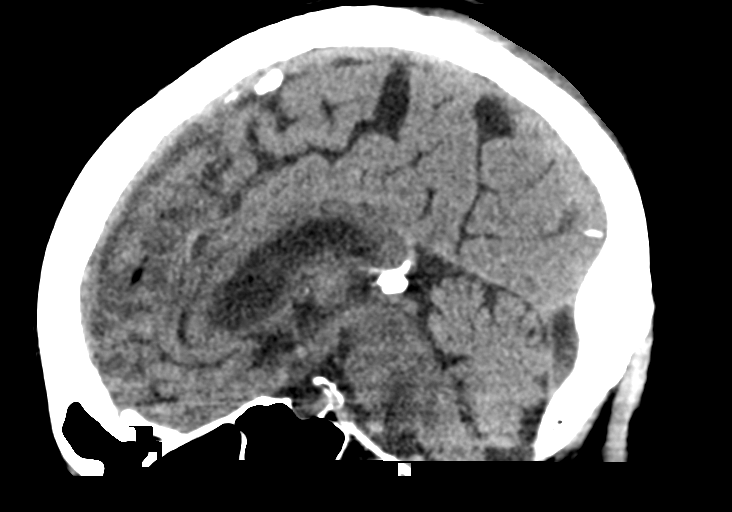
[im 33/50  brain]
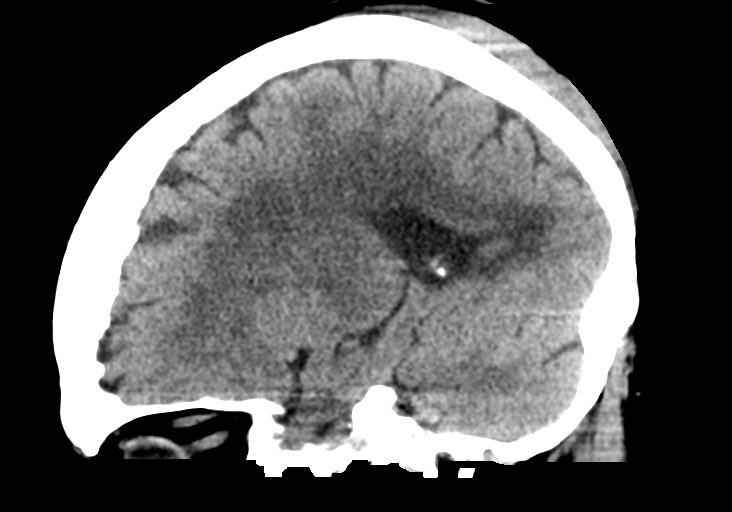

[15 of 47 positions shown; findings below may reference images not displayed]

FINDINGS: Brain: No acute territorial infarction, hemorrhage, or intracranial
mass. Mild atrophy. Advanced hypodensity in the white matter
consistent with chronic small vessel ischemic change. Nonenlarged
ventricles.

Vascular: No hyperdense vessels.  Carotid vascular calcification.

Skull: Normal. Negative for fracture or focal lesion.

Sinuses/Orbits: No acute finding.

Other: Large left posterior parietal scalp hematoma
IMPRESSION: 1. No CT evidence for acute intracranial abnormality.
2. Atrophy and chronic small vessel ischemic changes of the white
matter.
3. Large left posterior parietal scalp hematoma.

## 2022-06-27 DIAGNOSIS — G309 Alzheimer's disease, unspecified: Secondary | ICD-10-CM | POA: Diagnosis not present

## 2022-06-27 DIAGNOSIS — R531 Weakness: Secondary | ICD-10-CM | POA: Diagnosis not present

## 2022-06-27 DIAGNOSIS — F0280 Dementia in other diseases classified elsewhere without behavioral disturbance: Secondary | ICD-10-CM | POA: Diagnosis not present

## 2022-06-27 DIAGNOSIS — E871 Hypo-osmolality and hyponatremia: Secondary | ICD-10-CM | POA: Diagnosis not present

## 2022-06-27 DIAGNOSIS — F0150 Vascular dementia without behavioral disturbance: Secondary | ICD-10-CM | POA: Diagnosis not present

## 2022-08-21 DIAGNOSIS — H34231 Retinal artery branch occlusion, right eye: Secondary | ICD-10-CM | POA: Diagnosis not present

## 2022-08-21 DIAGNOSIS — Z961 Presence of intraocular lens: Secondary | ICD-10-CM | POA: Diagnosis not present

## 2022-08-21 DIAGNOSIS — D3131 Benign neoplasm of right choroid: Secondary | ICD-10-CM | POA: Diagnosis not present

## 2022-09-07 DIAGNOSIS — F0150 Vascular dementia without behavioral disturbance: Secondary | ICD-10-CM | POA: Diagnosis not present

## 2022-09-07 DIAGNOSIS — F0280 Dementia in other diseases classified elsewhere without behavioral disturbance: Secondary | ICD-10-CM | POA: Diagnosis not present

## 2022-09-07 DIAGNOSIS — E538 Deficiency of other specified B group vitamins: Secondary | ICD-10-CM | POA: Diagnosis not present

## 2022-09-07 DIAGNOSIS — G309 Alzheimer's disease, unspecified: Secondary | ICD-10-CM | POA: Diagnosis not present

## 2022-09-07 DIAGNOSIS — E782 Mixed hyperlipidemia: Secondary | ICD-10-CM | POA: Diagnosis not present

## 2022-09-07 DIAGNOSIS — E871 Hypo-osmolality and hyponatremia: Secondary | ICD-10-CM | POA: Diagnosis not present

## 2022-10-04 DIAGNOSIS — R4182 Altered mental status, unspecified: Secondary | ICD-10-CM | POA: Diagnosis not present

## 2022-10-19 DIAGNOSIS — B351 Tinea unguium: Secondary | ICD-10-CM | POA: Diagnosis not present

## 2022-10-19 DIAGNOSIS — L600 Ingrowing nail: Secondary | ICD-10-CM | POA: Diagnosis not present

## 2022-10-19 DIAGNOSIS — M79672 Pain in left foot: Secondary | ICD-10-CM | POA: Diagnosis not present

## 2022-10-19 DIAGNOSIS — M79671 Pain in right foot: Secondary | ICD-10-CM | POA: Diagnosis not present

## 2022-12-03 DIAGNOSIS — R300 Dysuria: Secondary | ICD-10-CM | POA: Diagnosis not present

## 2022-12-04 ENCOUNTER — Ambulatory Visit: Payer: PPO | Admitting: Dermatology

## 2022-12-24 DIAGNOSIS — R350 Frequency of micturition: Secondary | ICD-10-CM | POA: Diagnosis not present

## 2023-01-04 DIAGNOSIS — E538 Deficiency of other specified B group vitamins: Secondary | ICD-10-CM | POA: Diagnosis not present

## 2023-01-04 DIAGNOSIS — M79675 Pain in left toe(s): Secondary | ICD-10-CM | POA: Diagnosis not present

## 2023-01-04 DIAGNOSIS — E782 Mixed hyperlipidemia: Secondary | ICD-10-CM | POA: Diagnosis not present

## 2023-01-04 DIAGNOSIS — B351 Tinea unguium: Secondary | ICD-10-CM | POA: Diagnosis not present

## 2023-01-04 DIAGNOSIS — Z79899 Other long term (current) drug therapy: Secondary | ICD-10-CM | POA: Diagnosis not present

## 2023-01-04 DIAGNOSIS — M79674 Pain in right toe(s): Secondary | ICD-10-CM | POA: Diagnosis not present

## 2023-01-07 DIAGNOSIS — Z79899 Other long term (current) drug therapy: Secondary | ICD-10-CM | POA: Diagnosis not present

## 2023-01-07 DIAGNOSIS — E538 Deficiency of other specified B group vitamins: Secondary | ICD-10-CM | POA: Diagnosis not present

## 2023-01-07 DIAGNOSIS — E782 Mixed hyperlipidemia: Secondary | ICD-10-CM | POA: Diagnosis not present

## 2023-01-08 ENCOUNTER — Observation Stay: Payer: PPO

## 2023-01-08 ENCOUNTER — Inpatient Hospital Stay
Admission: EM | Admit: 2023-01-08 | Discharge: 2023-01-17 | DRG: 065 | Disposition: A | Discharge status: Hospice | Payer: PPO | Source: Skilled Nursing Facility | Attending: Internal Medicine | Admitting: Internal Medicine

## 2023-01-08 ENCOUNTER — Other Ambulatory Visit: Payer: Self-pay

## 2023-01-08 ENCOUNTER — Observation Stay (HOSPITAL_COMMUNITY)
Admit: 2023-01-08 | Discharge: 2023-01-08 | Disposition: A | Discharge status: Home / Self Care | Payer: PPO | Attending: Family Medicine | Admitting: Family Medicine

## 2023-01-08 ENCOUNTER — Emergency Department: Payer: PPO

## 2023-01-08 DIAGNOSIS — Z66 Do not resuscitate: Secondary | ICD-10-CM | POA: Diagnosis present

## 2023-01-08 DIAGNOSIS — Z8249 Family history of ischemic heart disease and other diseases of the circulatory system: Secondary | ICD-10-CM | POA: Diagnosis not present

## 2023-01-08 DIAGNOSIS — F0280 Dementia in other diseases classified elsewhere without behavioral disturbance: Secondary | ICD-10-CM | POA: Diagnosis present

## 2023-01-08 DIAGNOSIS — Z79890 Hormone replacement therapy (postmenopausal): Secondary | ICD-10-CM

## 2023-01-08 DIAGNOSIS — I10 Essential (primary) hypertension: Secondary | ICD-10-CM | POA: Diagnosis present

## 2023-01-08 DIAGNOSIS — I6389 Other cerebral infarction: Secondary | ICD-10-CM | POA: Diagnosis present

## 2023-01-08 DIAGNOSIS — R410 Disorientation, unspecified: Secondary | ICD-10-CM | POA: Diagnosis not present

## 2023-01-08 DIAGNOSIS — Z1152 Encounter for screening for COVID-19: Secondary | ICD-10-CM

## 2023-01-08 DIAGNOSIS — R29702 NIHSS score 2: Secondary | ICD-10-CM | POA: Diagnosis present

## 2023-01-08 DIAGNOSIS — E039 Hypothyroidism, unspecified: Secondary | ICD-10-CM | POA: Diagnosis present

## 2023-01-08 DIAGNOSIS — F0390 Unspecified dementia without behavioral disturbance: Secondary | ICD-10-CM

## 2023-01-08 DIAGNOSIS — Z881 Allergy status to other antibiotic agents status: Secondary | ICD-10-CM

## 2023-01-08 DIAGNOSIS — Z888 Allergy status to other drugs, medicaments and biological substances status: Secondary | ICD-10-CM

## 2023-01-08 DIAGNOSIS — R4781 Slurred speech: Principal | ICD-10-CM

## 2023-01-08 DIAGNOSIS — R1312 Dysphagia, oropharyngeal phase: Secondary | ICD-10-CM | POA: Diagnosis not present

## 2023-01-08 DIAGNOSIS — R1319 Other dysphagia: Secondary | ICD-10-CM | POA: Diagnosis not present

## 2023-01-08 DIAGNOSIS — R4701 Aphasia: Secondary | ICD-10-CM | POA: Diagnosis present

## 2023-01-08 DIAGNOSIS — Z90710 Acquired absence of both cervix and uterus: Secondary | ICD-10-CM | POA: Diagnosis not present

## 2023-01-08 DIAGNOSIS — Z87891 Personal history of nicotine dependence: Secondary | ICD-10-CM

## 2023-01-08 DIAGNOSIS — F03B0 Unspecified dementia, moderate, without behavioral disturbance, psychotic disturbance, mood disturbance, and anxiety: Secondary | ICD-10-CM | POA: Diagnosis not present

## 2023-01-08 DIAGNOSIS — G309 Alzheimer's disease, unspecified: Secondary | ICD-10-CM | POA: Diagnosis present

## 2023-01-08 DIAGNOSIS — K219 Gastro-esophageal reflux disease without esophagitis: Secondary | ICD-10-CM | POA: Diagnosis present

## 2023-01-08 DIAGNOSIS — I639 Cerebral infarction, unspecified: Secondary | ICD-10-CM | POA: Diagnosis not present

## 2023-01-08 DIAGNOSIS — R29810 Facial weakness: Secondary | ICD-10-CM | POA: Diagnosis present

## 2023-01-08 DIAGNOSIS — Z7189 Other specified counseling: Secondary | ICD-10-CM | POA: Diagnosis not present

## 2023-01-08 DIAGNOSIS — R1310 Dysphagia, unspecified: Secondary | ICD-10-CM | POA: Diagnosis present

## 2023-01-08 DIAGNOSIS — G8191 Hemiplegia, unspecified affecting right dominant side: Secondary | ICD-10-CM | POA: Diagnosis present

## 2023-01-08 DIAGNOSIS — I6523 Occlusion and stenosis of bilateral carotid arteries: Secondary | ICD-10-CM | POA: Diagnosis not present

## 2023-01-08 DIAGNOSIS — Z85828 Personal history of other malignant neoplasm of skin: Secondary | ICD-10-CM | POA: Diagnosis not present

## 2023-01-08 DIAGNOSIS — F0150 Vascular dementia without behavioral disturbance: Secondary | ICD-10-CM | POA: Diagnosis present

## 2023-01-08 DIAGNOSIS — Z885 Allergy status to narcotic agent status: Secondary | ICD-10-CM | POA: Diagnosis not present

## 2023-01-08 DIAGNOSIS — E876 Hypokalemia: Secondary | ICD-10-CM | POA: Diagnosis present

## 2023-01-08 DIAGNOSIS — Z515 Encounter for palliative care: Secondary | ICD-10-CM | POA: Diagnosis not present

## 2023-01-08 DIAGNOSIS — E7800 Pure hypercholesterolemia, unspecified: Secondary | ICD-10-CM | POA: Diagnosis present

## 2023-01-08 DIAGNOSIS — G459 Transient cerebral ischemic attack, unspecified: Secondary | ICD-10-CM | POA: Diagnosis present

## 2023-01-08 DIAGNOSIS — I672 Cerebral atherosclerosis: Secondary | ICD-10-CM | POA: Diagnosis present

## 2023-01-08 DIAGNOSIS — G9340 Encephalopathy, unspecified: Secondary | ICD-10-CM | POA: Diagnosis not present

## 2023-01-08 DIAGNOSIS — R471 Dysarthria and anarthria: Secondary | ICD-10-CM | POA: Diagnosis present

## 2023-01-08 DIAGNOSIS — Z4659 Encounter for fitting and adjustment of other gastrointestinal appliance and device: Secondary | ICD-10-CM | POA: Diagnosis not present

## 2023-01-08 DIAGNOSIS — E785 Hyperlipidemia, unspecified: Secondary | ICD-10-CM | POA: Diagnosis not present

## 2023-01-08 DIAGNOSIS — Z79899 Other long term (current) drug therapy: Secondary | ICD-10-CM

## 2023-01-08 DIAGNOSIS — R638 Other symptoms and signs concerning food and fluid intake: Secondary | ICD-10-CM | POA: Diagnosis not present

## 2023-01-08 DIAGNOSIS — Z7401 Bed confinement status: Secondary | ICD-10-CM | POA: Diagnosis not present

## 2023-01-08 DIAGNOSIS — F03B11 Unspecified dementia, moderate, with agitation: Secondary | ICD-10-CM | POA: Diagnosis not present

## 2023-01-08 LAB — CBC
HCT: 39.6 % (ref 36.0–46.0)
Hemoglobin: 12.3 g/dL (ref 12.0–15.0)
MCH: 27.4 pg (ref 26.0–34.0)
MCHC: 31.1 g/dL (ref 30.0–36.0)
MCV: 88.2 fL (ref 80.0–100.0)
Platelets: 227 10*3/uL (ref 150–400)
RBC: 4.49 MIL/uL (ref 3.87–5.11)
RDW: 14.9 % (ref 11.5–15.5)
WBC: 6.6 10*3/uL (ref 4.0–10.5)
nRBC: 0 % (ref 0.0–0.2)

## 2023-01-08 LAB — CBG MONITORING, ED: Glucose-Capillary: 81 mg/dL (ref 70–99)

## 2023-01-08 LAB — COMPREHENSIVE METABOLIC PANEL
ALT: 10 U/L (ref 0–44)
AST: 15 U/L (ref 15–41)
Albumin: 3.2 g/dL — ABNORMAL LOW (ref 3.5–5.0)
Alkaline Phosphatase: 82 U/L (ref 38–126)
Anion gap: 9 (ref 5–15)
BUN: 13 mg/dL (ref 8–23)
CO2: 30 mmol/L (ref 22–32)
Calcium: 8.9 mg/dL (ref 8.9–10.3)
Chloride: 102 mmol/L (ref 98–111)
Creatinine, Ser: 0.78 mg/dL (ref 0.44–1.00)
GFR, Estimated: >60 mL/min (ref >60)
Glucose, Bld: 81 mg/dL (ref 70–99)
Potassium: 3.3 mmol/L — ABNORMAL LOW (ref 3.5–5.1)
Sodium: 141 mmol/L (ref 135–145)
Total Bilirubin: 0.5 mg/dL (ref 0.3–1.2)
Total Protein: 6.9 g/dL (ref 6.5–8.1)

## 2023-01-08 LAB — DIFFERENTIAL
Abs Immature Granulocytes: 0.02 10*3/uL (ref 0.00–0.07)
Basophils Absolute: 0 10*3/uL (ref 0.0–0.1)
Basophils Relative: 1 %
Eosinophils Absolute: 0.1 10*3/uL (ref 0.0–0.5)
Eosinophils Relative: 2 %
Immature Granulocytes: 0 %
Lymphocytes Relative: 25 %
Lymphs Abs: 1.6 10*3/uL (ref 0.7–4.0)
Monocytes Absolute: 0.6 10*3/uL (ref 0.1–1.0)
Monocytes Relative: 9 %
Neutro Abs: 4.2 10*3/uL (ref 1.7–7.7)
Neutrophils Relative %: 63 %

## 2023-01-08 LAB — PROTIME-INR
INR: 1 (ref 0.8–1.2)
Prothrombin Time: 13.4 seconds (ref 11.4–15.2)

## 2023-01-08 LAB — APTT: aPTT: 26 seconds (ref 24–36)

## 2023-01-08 LAB — ETHANOL: Alcohol, Ethyl (B): <10 mg/dL (ref <10)

## 2023-01-08 LAB — GLUCOSE, CAPILLARY: Glucose-Capillary: 94 mg/dL (ref 70–99)

## 2023-01-08 MED ORDER — STROKE: EARLY STAGES OF RECOVERY BOOK: Freq: Once | Status: AC

## 2023-01-08 MED ORDER — ASPIRIN 325 MG PO TBEC: 325.0000 mg | DELAYED_RELEASE_TABLET | Freq: Once | ORAL | Status: DC

## 2023-01-08 MED ORDER — CLOPIDOGREL BISULFATE 75 MG PO TABS
300.0000 mg | ORAL_TABLET | Freq: Once | ORAL | Status: AC
  Administered 2023-01-08: 300 mg via ORAL
  Filled 2023-01-08: qty 4

## 2023-01-08 MED ORDER — SODIUM CHLORIDE 0.9 % IV BOLUS
1000.0000 mL | Freq: Once | INTRAVENOUS | Status: AC
  Administered 2023-01-08: 1000 mL via INTRAVENOUS

## 2023-01-08 MED ORDER — LORAZEPAM 0.5 MG PO TABS
0.5000 mg | ORAL_TABLET | Freq: Once | ORAL | Status: DC | PRN
  Filled 2023-01-08: qty 1

## 2023-01-08 MED ORDER — POTASSIUM CHLORIDE CRYS ER 20 MEQ PO TBCR
40.0000 meq | EXTENDED_RELEASE_TABLET | Freq: Once | ORAL | Status: AC
  Administered 2023-01-08: 40 meq via ORAL
  Filled 2023-01-08: qty 2

## 2023-01-08 MED ORDER — LABETALOL HCL 5 MG/ML IV SOLN
10.0000 mg | INTRAVENOUS | Status: DC | PRN
  Administered 2023-01-08 – 2023-01-11 (×9): 10 mg via INTRAVENOUS
  Filled 2023-01-08 (×10): qty 4

## 2023-01-08 MED ORDER — LEVOTHYROXINE SODIUM 88 MCG PO TABS: 88.0000 ug | ORAL_TABLET | Freq: Every day | ORAL | Status: DC

## 2023-01-08 MED ORDER — RIVASTIGMINE TARTRATE 3 MG PO CAPS
3.0000 mg | ORAL_CAPSULE | Freq: Two times a day (BID) | ORAL | Status: DC
  Administered 2023-01-09: 3 mg via ORAL
  Filled 2023-01-08 (×6): qty 1

## 2023-01-08 MED ORDER — ASPIRIN 81 MG PO TBEC
81.0000 mg | DELAYED_RELEASE_TABLET | Freq: Every day | ORAL | Status: DC
  Administered 2023-01-09: 81 mg via ORAL
  Filled 2023-01-08: qty 1

## 2023-01-08 MED ORDER — DIPHENHYDRAMINE HCL 50 MG/ML IJ SOLN
12.5000 mg | INTRAMUSCULAR | Status: AC | PRN
  Administered 2023-01-08 – 2023-01-10 (×2): 12.5 mg via INTRAVENOUS
  Filled 2023-01-08 (×2): qty 1

## 2023-01-08 MED ORDER — MEMANTINE HCL 5 MG PO TABS: 5.0000 mg | ORAL_TABLET | Freq: Every day | ORAL | Status: DC

## 2023-01-08 MED ORDER — CLOPIDOGREL BISULFATE 75 MG PO TABS
75.0000 mg | ORAL_TABLET | Freq: Every day | ORAL | Status: DC
  Administered 2023-01-09: 75 mg via ORAL
  Filled 2023-01-08: qty 1

## 2023-01-08 MED ORDER — AMLODIPINE BESYLATE 10 MG PO TABS
10.0000 mg | ORAL_TABLET | Freq: Every day | ORAL | Status: DC
  Administered 2023-01-09: 10 mg via ORAL
  Filled 2023-01-08: qty 1

## 2023-01-08 MED ORDER — IOHEXOL 350 MG/ML SOLN
75.0000 mL | Freq: Once | INTRAVENOUS | Status: AC | PRN
  Administered 2023-01-08: 75 mL via INTRAVENOUS

## 2023-01-08 MED ORDER — ASPIRIN 81 MG PO CHEW
324.0000 mg | CHEWABLE_TABLET | Freq: Once | ORAL | Status: AC
  Administered 2023-01-08: 324 mg via ORAL
  Filled 2023-01-08: qty 4

## 2023-01-08 MED ORDER — SODIUM CHLORIDE 0.9% FLUSH
3.0000 mL | Freq: Once | INTRAVENOUS | Status: AC
  Administered 2023-01-08: 3 mL via INTRAVENOUS

## 2023-01-08 MED ORDER — LORAZEPAM 2 MG/ML IJ SOLN
0.5000 mg | Freq: Once | INTRAMUSCULAR | Status: AC
  Administered 2023-01-08: 0.5 mg via INTRAVENOUS
  Filled 2023-01-08: qty 1

## 2023-01-08 MED ORDER — ASPIRIN 81 MG PO CHEW
CHEWABLE_TABLET | ORAL | Status: AC
  Filled 2023-01-08: qty 1

## 2023-01-08 MED ORDER — LORAZEPAM 0.5 MG PO TABS: 0.5000 mg | ORAL_TABLET | Freq: Once | ORAL | Status: DC | PRN

## 2023-01-08 MED ORDER — DIVALPROEX SODIUM 125 MG PO DR TAB
125.0000 mg | DELAYED_RELEASE_TABLET | Freq: Every day | ORAL | Status: DC
  Administered 2023-01-09: 125 mg via ORAL
  Filled 2023-01-08 (×3): qty 1

## 2023-01-08 NOTE — Assessment & Plan Note (Addendum)
New onset slurred speech around 9:30 PM at facility Noted baseline dementia Code stroke called upon arrival to the ER with negative CT head Had a recurrence of symptoms including left-sided facial droop upon my evaluation Repeat code stroke called with CT of the head and neck pending Will plan for formal CVA evaluation including MRI of the brain, CTA of the head neck, TTE, risk stratification labs On baby aspirin chronically S/p plavix load in ER  Add on Plavix Permissive hypertension over the next 24 hours (Treat if SBP > 180. After 24 hours decrease BP by 20% per day to final goal of A999333 systolic) Appreciate neurology input Follow-up further neurology recommendations

## 2023-01-08 NOTE — ED Notes (Signed)
Pt coming from assisted living [facility]

## 2023-01-08 NOTE — Assessment & Plan Note (Signed)
Allow for permissive HTN in setting of CVA eval  Treat if SBP > 180. After 24 hours decrease BP by 20% per day to final goal of A999333 systolic Add on prn labetalol

## 2023-01-08 NOTE — Code Documentation (Signed)
Stroke Response Nurse Documentation Code Documentation  Bethany Hicks is a 89 y.o. female arriving to [facility] via [facility] on 01/08/2023 with past medical hx of HTN, hypothyroidism, basal cell carcinoma, anxiety, and dementia per daughter at bedside. On No antithrombotic. Code stroke was activated by ED.   Patient from [facility] where she was LKW at 0830 and now complaining of slurred speech. Per daughters, patient was last seen as baseline at breakfast this morning around 0830. Patient's caretaker noticed went to check on patient around 66 and patient had acute onset slurred speech and called daughter. Daughter noticed patient was dragging right foot and slurring words and brought patient to [facility] for eval and subsequently sent to ED for eval. Slurred speech resolved by time of Code Stroke activation.  Stroke team at the bedside after code stroke activation. Labs drawn and patient cleared for CT by Dr. Jacelyn Grip. Patient to CT with team. NIHSS 4, see documentation for details and code stroke times. Patient with disoriented, right hemianopia, and right decreased sensation on exam. The following imaging was completed:  CT Head. Patient is not a candidate for IV Thrombolytic due to too mild to treat, per MD. Patient is not a candidate for IR due to exam not consistent with LVO, per MD.   Care Plan: q 30 min NIHSS and vital signs until outside window at 1300 followed by q2 hrs, stroke swallow screen per order.   Bedside handoff with ED RN Jenny Reichmann.    Charise Carwin  Stroke Response RN

## 2023-01-08 NOTE — Consult Note (Signed)
CODE STROKE- PHARMACY COMMUNICATION   Time CODE STROKE called/page received:03/19 11:08  Time response to CODE STROKE was made (in person or via phone): 11:15  Time Stroke Kit retrieved from [facility] (only if needed): Not given  Name of Provider/Nurse contacted:Lindzen  Past Medical History:  Diagnosis Date   Anxiety    Basal cell carcinoma 10/07/2009   L central forehead   Fluid collection (edema) in the arms, legs, hands and feet    History of panic attacks    Hypertension    Hypothyroidism    Prior to Admission medications   Medication Sig Start Date End Date Taking? Authorizing Provider  amLODipine (NORVASC) 10 MG tablet Take 1 tablet (10 mg total) by mouth daily. 01/18/21  Yes Sreenath, Sudheer B, MD  aspirin 81 MG EC tablet Take by mouth.   Yes [provider]  Calcium Polycarbophil (FIBER) 625 MG TABS Take 625 mg by mouth daily.   Yes [provider]  carboxymethylcellulose 1 % ophthalmic solution Apply 1 drop to eye QID.   Yes [provider]  Cholecalciferol (VITAMIN D-1000 MAX ST) 25 MCG (1000 UT) tablet Take 1,000 Units by mouth daily.   Yes [provider]  cyanocobalamin (,VITAMIN B-12,) 1000 MCG/ML injection Inject 1,000 mcg into the muscle every 30 (thirty) days. 11/26/20  Yes [provider]  divalproex (DEPAKOTE) 125 MG DR tablet Take 125 mg by mouth daily.   Yes [provider]  guaifenesin (ROBITUSSIN) 100 MG/5ML syrup Take 5 mLs by mouth every 6 (six) hours as needed for cough or congestion.   Yes [provider]  ibuprofen (ADVIL) 400 MG tablet Take 400 mg by mouth daily as needed for moderate pain, mild pain, headache or fever. 01/29/22  Yes [provider]  ketoconazole (NIZORAL) 2 % shampoo Apply 1 application topically 3 (three) times a week. Let sit 5 minutes before rinsing out 04/28/21  Yes Brendolyn Patty, MD  levothyroxine (SYNTHROID) 88 MCG tablet Take 88 mcg by mouth daily before breakfast.  12/20/21  Yes [provider]  Magnesium Oxide -Mg Supplement 250 MG TABS Take 250 mg by mouth daily.   Yes [provider]  memantine (NAMENDA) 5 MG tablet Take 5 mg by mouth 2 (two) times daily. 08/05/20  Yes [provider]  rivastigmine (EXELON) 3 MG capsule Take 3 mg by mouth 2 (two) times daily. 01/02/21  Yes [provider]  senna-docusate (SENOKOT-S) 8.6-50 MG tablet Take 1 tablet by mouth 2 (two) times daily. Patient taking differently: Take 2 tablets by mouth daily. 01/17/21  Yes Sreenath, Sudheer B, MD  sodium chloride 1 g tablet Take 1 tablet (1 g total) by mouth 3 (three) times daily. 09/17/18  Yes Schuyler Amor, MD  valsartan (DIOVAN) 40 MG tablet Take 40 mg by mouth daily.   Yes [provider]  fluocinonide (LIDEX) 0.05 % external solution Apply 1 application topically as directed. Apply several gtts qd to bid to plaque on left frontal scalp until clear Patient not taking: Reported on 01/08/2023 04/27/21   Brendolyn Patty, MD  mupirocin ointment (BACTROBAN) 2 % Apply to open areas on arms twice daily until healed. Patient not taking: Reported on 01/08/2023 12/06/21   Brendolyn Patty, MD  traMADol (ULTRAM) 50 MG tablet Take 1-2 tablets (50-100 mg total) by mouth every 6 (six) hours as needed for moderate pain. Patient not taking: Reported on 01/08/2023 01/15/21   Reche Dixon, PA-C  cholecalciferol (VITAMIN D) 400 UNITS TABS tablet Take  1,000 Units by mouth daily.  01/08/23  [provider]  hydrochlorothiazide (HYDRODIURIL) 50 MG tablet Take 50 mg by mouth daily. 11/24/21 01/08/23  [provider]  LORazepam (ATIVAN) 0.5 MG tablet Take by mouth. 07/27/21 01/08/23  [provider]  PARoxetine (PAXIL) 10 MG tablet Take 10 mg by mouth 2 (two) times daily. 01/02/21 01/08/23  [provider]  potassium chloride (KLOR-CON) 10 MEQ tablet Take 10 mEq by mouth daily. 11/20/20 01/08/23  [provider]    Oswald Hillock  ,PharmD Clinical Pharmacist  01/08/2023  3:27 PM

## 2023-01-08 NOTE — H&P (Addendum)
History and Physical    Patient: Bethany Hicks [id number] DOB: Feb 24, 1928 DOA: 01/08/2023 DOS: the patient was seen and examined on 01/08/2023 PCP: Rusty Aus, MD  Patient coming from: ALF/ILF  Chief Complaint:  Chief Complaint  Patient presents with   Code Stroke   HPI: Bethany Hicks is a 89 y.o. female with medical history significant of hypertension, hypothyroidism, dementia presenting with CVA.  Patient noted with slurred speech at facility around 930 this morning.  This is well off baseline per report.  Per report, patient was eating breakfast like normal prior to this.  Family was called to which patient's daughter went to evaluate the patient with patient noted slurred speech.  Patient was then taken to the ER by car with improvement in symptoms upon arrival.  Code stroke was called upon arrival.  Initial imaging grossly negative.  Of note patient also had some mild confusion within the past month including urine and labs that were done at primary care's office.  No reports of fevers or chills.  Reports no nausea or vomiting.  Reports remote history of CVA diagnosed years ago as an incidental finding. Presented to the ER afebrile, systolic pressures in the 180s to 190s, satting well on room air.  White count 6.6, hemoglobin 12.3, potassium 3.3, creatinine 0.8.  Initial code stroke CT head grossly stable.  Upon my evaluation at the bedside, patient had new onset left-sided facial droop to which a repeat code stroke was called with neurology reevaluation. Review of Systems: As mentioned in the history of present illness. All other systems reviewed and are negative. Past Medical History:  Diagnosis Date   Anxiety    Basal cell carcinoma 10/07/2009   L central forehead   Fluid collection (edema) in the arms, legs, hands and feet    History of panic attacks    Hypertension    Hypothyroidism    Past Surgical History:  Procedure Laterality Date   ABDOMINAL HYSTERECTOMY     CATARACT  EXTRACTION W/PHACO Left 03/07/2015   Procedure: CATARACT EXTRACTION PHACO AND INTRAOCULAR LENS PLACEMENT ([place]);  Surgeon: Estill Cotta, MD;  Location: [facility] ORS;  Service: Ophthalmology;  Laterality: Left;  [place] 00:57 AP% 24.6 CDE 24.44   CHOLECYSTECTOMY     EYE SURGERY     cataract   ORIF ANKLE FRACTURE Right 01/13/2021   Procedure: OPEN REDUCTION INTERNAL FIXATION (ORIF) ANKLE FRACTURE-Bimalleolar;  Surgeon: Dereck Leep, MD;  Location: [facility] ORS;  Service: Orthopedics;  Laterality: Right;   Social History:  reports that she has quit smoking. She has never used smokeless tobacco. She reports that she does not drink alcohol. No history on file for drug use.  Allergies  Allergen Reactions   Simvastatin Other (See Comments)    Muscle pain   Venlafaxine Other (See Comments)    Hallucinations    Ace Inhibitors Cough   Amlodipine Besylate     REACTION: Fatigue   Calcitonin ([place])    Ciprofloxacin Swelling    Joint swelling.   Doxazosin Mesylate     REACTION: Urine incont.   Hydrocod Poli-Chlorphe Poli Er    Quetiapine Other (See Comments)    Weak and lethargic   Raloxifene     REACTION: Pain   Risedronate Sodium     REACTION: GI   Codeine Anxiety   Singulair [Montelukast] Anxiety    Family History  Problem Relation Age of Onset   Heart attack Brother     Prior to Admission medications  Medication Sig Start Date End Date Taking? Authorizing Provider  amLODipine (NORVASC) 10 MG tablet Take 1 tablet (10 mg total) by mouth daily. 01/18/21  Yes Sreenath, Sudheer B, MD  aspirin 81 MG EC tablet Take by mouth.   Yes [provider]  Calcium Polycarbophil (FIBER) 625 MG TABS Take 625 mg by mouth daily.   Yes [provider]  carboxymethylcellulose 1 % ophthalmic solution Apply 1 drop to eye QID.   Yes [provider]  Cholecalciferol (VITAMIN D-1000 MAX ST) 25 MCG (1000 UT) tablet Take 1,000 Units by mouth daily.   Yes [provider]   cyanocobalamin (,VITAMIN B-12,) 1000 MCG/ML injection Inject 1,000 mcg into the muscle every 30 (thirty) days. 11/26/20  Yes [provider]  divalproex (DEPAKOTE) 125 MG DR tablet Take 125 mg by mouth daily.   Yes [provider]  guaifenesin (ROBITUSSIN) 100 MG/5ML syrup Take 5 mLs by mouth every 6 (six) hours as needed for cough or congestion.   Yes [provider]  ibuprofen (ADVIL) 400 MG tablet Take 400 mg by mouth daily as needed for moderate pain, mild pain, headache or fever. 01/29/22  Yes [provider]  ketoconazole (NIZORAL) 2 % shampoo Apply 1 application topically 3 (three) times a week. Let sit 5 minutes before rinsing out 04/28/21  Yes Brendolyn Patty, MD  levothyroxine (SYNTHROID) 88 MCG tablet Take 88 mcg by mouth daily before breakfast. 12/20/21  Yes [provider]  Magnesium Oxide -Mg Supplement 250 MG TABS Take 250 mg by mouth daily.   Yes [provider]  memantine (NAMENDA) 5 MG tablet Take 5 mg by mouth 2 (two) times daily. 08/05/20  Yes [provider]  rivastigmine (EXELON) 3 MG capsule Take 3 mg by mouth 2 (two) times daily. 01/02/21  Yes [provider]  senna-docusate (SENOKOT-S) 8.6-50 MG tablet Take 1 tablet by mouth 2 (two) times daily. Patient taking differently: Take 2 tablets by mouth daily. 01/17/21  Yes Sreenath, Sudheer B, MD  sodium chloride 1 g tablet Take 1 tablet (1 g total) by mouth 3 (three) times daily. 09/17/18  Yes Schuyler Amor, MD  valsartan (DIOVAN) 40 MG tablet Take 40 mg by mouth daily.   Yes [provider]  fluocinonide (LIDEX) 0.05 % external solution Apply 1 application topically as directed. Apply several gtts qd to bid to plaque on left frontal scalp until clear Patient not taking: Reported on 01/08/2023 04/27/21   Brendolyn Patty, MD  mupirocin ointment (BACTROBAN) 2 % Apply to open areas on arms twice daily until healed. Patient not taking: Reported on 01/08/2023 12/06/21    Brendolyn Patty, MD  traMADol (ULTRAM) 50 MG tablet Take 1-2 tablets (50-100 mg total) by mouth every 6 (six) hours as needed for moderate pain. Patient not taking: Reported on 01/08/2023 01/15/21   Reche Dixon, [place]    Physical Exam: Vitals:   01/08/23 1133 01/08/23 1145 01/08/23 1146 01/08/23 1300  BP:  (!) 195/84  (!) 184/79  Pulse: 66 (!) 58  64  Resp: 16 18  16   Temp: 98 F (36.7 C) 98 F (36.7 C)  98 F (36.7 C)  TempSrc: Oral     SpO2: 95% 97%  100%  Weight:   75.8 kg   Height:   5\' 11"  (1.803 m)    Physical Exam Constitutional:      General: She is not in acute distress.    Appearance: She is normal weight.  HENT:  Head: Normocephalic and atraumatic.     Mouth/Throat:     Mouth: Mucous membranes are moist.  Eyes:     Pupils: Pupils are equal, round, and reactive to light.  Cardiovascular:     Rate and Rhythm: Normal rate and regular rhythm.  Pulmonary:     Effort: Pulmonary effort is normal.  Abdominal:     General: Bowel sounds are normal.  Musculoskeletal:        General: Normal range of motion.  Skin:    General: Skin is warm.  Neurological:     Comments: Positive left-sided facial droop and mild slurred speech Otherwise grossly nonfocal exam  Psychiatric:        Mood and Affect: Mood normal.     Data Reviewed:  There are no new results to review at this time. Lab Results  Component Value Date   WBC 6.6 01/08/2023   HGB 12.3 01/08/2023   HCT 39.6 01/08/2023   MCV 88.2 01/08/2023   PLT 227 [id number]   Last metabolic panel Lab Results  Component Value Date   GLUCOSE 81 01/08/2023   NA 141 01/08/2023   K 3.3 (L) 01/08/2023   CL 102 01/08/2023   CO2 30 01/08/2023   BUN 13 01/08/2023   CREATININE 0.78 01/08/2023   GFRNONAA >60 01/08/2023   CALCIUM 8.9 01/08/2023   PHOS 3.9 10/30/2007   PROT 6.9 01/08/2023   ALBUMIN 3.2 (L) 01/08/2023   BILITOT 0.5 01/08/2023   ALKPHOS 82 01/08/2023   AST 15 01/08/2023   ALT 10 01/08/2023   ANIONGAP  9 01/08/2023   CT HEAD CODE STROKE WO CONTRAST CLINICAL DATA:  Code stroke. Neuro deficit, acute, stroke suspected. Acute onset slurred speech. History of dementia. History of prior stroke with foot drop.  EXAM: CT HEAD WITHOUT CONTRAST  TECHNIQUE: Contiguous axial images were obtained from the base of the skull through the vertex without intravenous contrast.  RADIATION DOSE REDUCTION: This exam was performed according to the departmental dose-optimization program which includes automated exposure control, adjustment of the mA and/or kV according to patient size and/or use of iterative reconstruction technique.  COMPARISON:  Head CT 04/12/2022.  FINDINGS: Brain:  Mild generalized parenchymal atrophy.  Patchy and ill-defined hypoattenuation within the cerebral white matter, nonspecific but compatible with advanced chronic small vessel ischemic disease. Chronic small vessel ischemic changes are also present within the bilateral deep gray nuclei.  There is no acute intracranial hemorrhage.  No demarcated cortical infarct.  No extra-axial fluid collection.  No evidence of an intracranial mass.  No midline shift.  Vascular: No hyperdense vessel.  Atherosclerotic calcifications.  Skull: No fracture or aggressive osseous lesion.  Sinuses/Orbits: No mass or acute finding within the imaged orbits. No significant paranasal sinus disease at the imaged levels.  ASPECTS [facility] Stroke Program Early CT Score)  - Ganglionic level infarction (caudate, lentiform nuclei, internal capsule, insula, M1-M3 cortex): 7  - Supraganglionic infarction (M4-M6 cortex): 3  Total score (0-10 with 10 being normal): 10  No evidence of an acute intracranial abnormality. These results were communicated to Dr. Cheral Marker at 11:32 amon 3/19/2024by text page via the [facility] messaging system.  IMPRESSION: 1.  No evidence of an acute intracranial abnormality. 2. Parenchymal atrophy and chronic  small vessel disease, as described.  Electronically Signed   By: Kellie Simmering D.O.   On: 01/08/2023 11:32   Assessment and Plan: * CVA (cerebral vascular accident) ([place]) New onset slurred speech around 9:30 PM at facility Noted baseline dementia  Code stroke called upon arrival to the ER with negative CT head Had a recurrence of symptoms including left-sided facial droop upon my evaluation Repeat code stroke called with CT of the head and neck pending Will plan for formal CVA evaluation including MRI of the brain, CTA of the head neck, TTE, risk stratification labs On baby aspirin chronically S/p plavix load in ER  Add on Plavix Permissive hypertension over the next 24 hours (Treat if SBP > 180. After 24 hours decrease BP by 20% per day to final goal of A999333 systolic) Appreciate neurology input Follow-up further neurology recommendations  Dementia (HCC) Cont namenda, exelon,    GERD PPI  Essential hypertension Allow for permissive HTN in setting of CVA eval  Treat if SBP > 180. After 24 hours decrease BP by 20% per day to final goal of A999333 systolic Add on prn labetalol    HYPERCHOLESTEROLEMIA, PURE Check lipid panel  Start statin   Hypothyroidism Cont synthroid       Advance Care Planning:   Code Status: Prior   Consults: Neurology   Family Communication: Daughters are at the bedside   Severity of Illness: The appropriate patient status for this patient is OBSERVATION. Observation status is judged to be reasonable and necessary in order to provide the required intensity of service to ensure the patient's safety. The patient's presenting symptoms, physical exam findings, and initial radiographic and laboratory data in the context of their medical condition is felt to place them at decreased risk for further clinical deterioration. Furthermore, it is anticipated that the patient will be medically stable for discharge from the hospital within 2 midnights of  admission.   Author: Deneise Lever, MD 01/08/2023 4:18 PM  For on call review [email or web address].

## 2023-01-08 NOTE — ED Notes (Addendum)
Pt took plavix with applesauce from daughter on spoon. She is agitated and saying she is going to hit her. Dr. Cheral Marker notifed pt is likely going to have problems with staying still for CT and MRI. He is at bedside now.

## 2023-01-08 NOTE — Progress Notes (Signed)
  Chaplain On-Call responded to Code Stroke notification at 1108 hours.  The patient was receiving assessment and treatment by the Medical Team.  ED Staff will call Chaplain if additional support is requested.  Chaplain Pollyann Samples M.Div., [facility]

## 2023-01-08 NOTE — ED Notes (Signed)
Family reported some difficulty with swallowing potassium pills. Speech consult ordered per Dr Ernestina Patches. NPO until Speech has assessed patient.

## 2023-01-08 NOTE — Assessment & Plan Note (Addendum)
Check lipid panel  Start statin

## 2023-01-08 NOTE — Assessment & Plan Note (Signed)
Cont synthroid 

## 2023-01-08 NOTE — ED Notes (Addendum)
Md at bedside discussing POC with pt's family. Speech assessment done by therapist at bedside. Pt to remain NPO.

## 2023-01-08 NOTE — Consult Note (Addendum)
NEURO HOSPITALIST CONSULT NOTE   Requestig physician: Dr. Jori Moll  Reason for Consult: Acute onset of slurred speech  History obtained from:  Daughter and Chart     HPI:                                                                                                                                          Bethany Hicks is an 89 y.o. female with a PMHx of stroke 1.5 years ago (RLE weakness at that time, no residual deficit currently), anxiety, HTN, dementia (not specified which type) and hypothyroidism who presented to the ED today for assessment of acute onset slurred speech this morning. She is a resident at [facility] assisted [facility]. Staff there noted her to be normal today at breakfast, with best estimate for that time being 0830, per family. At about 1000 a caretaker arrived to assess her. When the caretaker woke her up, the patient's speech was slurred and shortly afterwards when her strength was tested, her left hand also seemed weak. No facial droop was noted. BP was taken and was elevated. It was noted that her BPs have been elevated lately. Her daughter then came over to take her to the hospital for evaluation; when she walked her mother to the car, Bethany Hicks right leg seemed to be dragging. Symptoms seemed to have resolved per daughter while en route to the ED. BP 192/95 with CBG 81. A Code Stroke was called based on the description of symptoms while at the facility this morning.   Home medications include ASA for stroke prevention and memantine with rivastigmine for management of her dementia.   Past Medical History:  Diagnosis Date   Anxiety    Basal cell carcinoma 10/07/2009   L central forehead   Fluid collection (edema) in the arms, legs, hands and feet    History of panic attacks    Hypertension    Hypothyroidism     Past Surgical History:  Procedure Laterality Date   ABDOMINAL HYSTERECTOMY     CATARACT EXTRACTION W/PHACO Left 03/07/2015   Procedure:  CATARACT EXTRACTION PHACO AND INTRAOCULAR LENS PLACEMENT ([place]);  Surgeon: Estill Cotta, MD;  Location: [facility];  Service: Ophthalmology;  Laterality: Left;  [place] 00:57 AP% 24.6 CDE 24.44   CHOLECYSTECTOMY     EYE SURGERY     cataract   ORIF ANKLE FRACTURE Right 01/13/2021   Procedure: OPEN REDUCTION INTERNAL FIXATION (ORIF) ANKLE FRACTURE-Bimalleolar;  Surgeon: Dereck Leep, MD;  Location: [facility] OR[facility];  Service: Orthopedics;  Laterality: Right;    Family History  Problem Relation Age of Onset   Heart attack Brother             Social History:  reports that she has quit smoking. She has never used  smokeless tobacco. She reports that she does not drink alcohol. No history on file for drug use.  Allergies  Allergen Reactions   Simvastatin Other (See Comments)    Muscle pain   Venlafaxine Other (See Comments)    Hallucinations    Ace Inhibitors Cough   Amlodipine Besylate     REACTION: Fatigue   Calcitonin ([place])    Ciprofloxacin Swelling    Joint swelling.   Doxazosin Mesylate     REACTION: Urine incont.   Hydrocod Poli-Chlorphe Poli Er    Raloxifene     REACTION: Pain   Risedronate Sodium     REACTION: GI   Codeine Anxiety   Singulair [Montelukast] Anxiety    MEDICATIONS:                                                                                                                     No current facility-administered medications on file prior to encounter.   Current Outpatient Medications on File Prior to Encounter  Medication Sig Dispense Refill   amLODipine (NORVASC) 10 MG tablet Take 1 tablet (10 mg total) by mouth daily.     aspirin 81 MG EC tablet Take by mouth.     cholecalciferol (VITAMIN D) 400 UNITS TABS tablet Take 1,000 Units by mouth daily.     cyanocobalamin (,VITAMIN B-12,) 1000 MCG/ML injection Inject 1,000 mcg into the muscle every 30 (thirty) days.     fluocinonide (LIDEX) 0.05 % external solution Apply 1 application topically as directed. Apply  several gtts qd to bid to plaque on left frontal scalp until clear 60 mL 2   hydrochlorothiazide (HYDRODIURIL) 50 MG tablet Take 50 mg by mouth daily.     ketoconazole (NIZORAL) 2 % shampoo Apply 1 application topically 3 (three) times a week. Let sit 5 minutes before rinsing out 120 mL 6   LORazepam (ATIVAN) 0.5 MG tablet Take by mouth.     memantine (NAMENDA) 5 MG tablet Take 5 mg by mouth 2 (two) times daily.     mupirocin ointment (BACTROBAN) 2 % Apply to open areas on arms twice daily until healed. 22 g 1   PARoxetine (PAXIL) 10 MG tablet Take 10 mg by mouth 2 (two) times daily.     potassium chloride (KLOR-CON) 10 MEQ tablet Take 10 mEq by mouth daily.     rivastigmine (EXELON) 3 MG capsule Take 3 mg by mouth 2 (two) times daily.     senna-docusate (SENOKOT-S) 8.6-50 MG tablet Take 1 tablet by mouth 2 (two) times daily.     sodium chloride 1 g tablet Take 1 tablet (1 g total) by mouth 3 (three) times daily. 6 tablet 0   traMADol (ULTRAM) 50 MG tablet Take 1-2 tablets (50-100 mg total) by mouth every 6 (six) hours as needed for moderate pain. (Patient not taking: Reported on 04/27/2021) 30 tablet 0      ROS:  The patient denies any symptoms currently. Of note, ROS may be confounded by her memory deficit.    Blood pressure (!) 192/95, pulse 66, temperature 98 F (36.7 C), temperature source Oral, resp. rate 16, SpO2 95 %.   General Examination:                                                                                                       Physical Exam  HEENT-  Hall/AT    Lungs- Respirations unlabored Extremities- Warm and well perfused  Neurological Examination Mental Status: Awake and alert. Oriented to the city, the state and the hospital, but cannot recall her age, the day of the week, the month or year. Speech is fluent with intact  comprehension and naming, except for the key item on the NIHSS card, which she identifies as a "nail file". No dysarthria or dysphonia noted.  Cranial Nerves: II: OS: Visual fields intact with no extinction to DSS.  OD: Visual field constriction in nasal and temporal visual fields with preserved central vision.  PERRL. III,IV, VI: No ptosis. EOMI. No nystagmus. V: Temp sensation decreased on the right VII: Smile symmetric VIII: HOH IX,X: No hypophonia or hoarseness XI: Symmetric XII: Midline tongue extension Motor: RUE: 5/5 RLE: 5/5 LUE: 5/5 LLE: 5/5 No pronator drift No tremor or asterixis Sensory: Temp and FT intact x 4. No extinction to DSS. Deep Tendon Reflexes: 2+ and symmetric bilateral biceps, brachioradialis and patellae Cerebellar: No ataxia with FNF or H-S bilaterally Gait: Able to stand with own power. Mildly stooped with small steps.   NIHSS: 2   Lab Results: Basic Metabolic Panel: No results for input(s): "NA", "K", "CL", "CO2", "GLUCOSE", "BUN", "CREATININE", "CALCIUM", "MG", "PHOS" in the last 168 hours.  CBC: Recent Labs  Lab 01/08/23 1112  WBC 6.6  NEUTROABS 4.2  HGB 12.3  HCT 39.6  MCV 88.2  PLT 227    Cardiac Enzymes: No results for input(s): "CKTOTAL", "CKMB", "CKMBINDEX", "TROPONINI" in the last 168 hours.  Lipid Panel: No results for input(s): "CHOL", "TRIG", "HDL", "CHOLHDL", "VLDL", "LDLCALC" in the last 168 hours.  Imaging: CT HEAD CODE STROKE WO CONTRAST  Result Date: 01/08/2023 CLINICAL DATA:  Code stroke. Neuro deficit, acute, stroke suspected. Acute onset slurred speech. History of dementia. History of prior stroke with foot drop. EXAM: CT HEAD WITHOUT CONTRAST TECHNIQUE: Contiguous axial images were obtained from the base of the skull through the vertex without intravenous contrast. RADIATION DOSE REDUCTION: This exam was performed according to the departmental dose-optimization program which includes automated exposure control,  adjustment of the mA and/or kV according to patient size and/or use of iterative reconstruction technique. COMPARISON:  Head CT 04/12/2022. FINDINGS: Brain: Mild generalized parenchymal atrophy. Patchy and ill-defined hypoattenuation within the cerebral white matter, nonspecific but compatible with advanced chronic small vessel ischemic disease. Chronic small vessel ischemic changes are also present within the bilateral deep gray nuclei. There is no acute intracranial hemorrhage. No demarcated cortical infarct. No extra-axial fluid collection. No evidence of an intracranial mass. No midline shift. Vascular: No hyperdense vessel.  Atherosclerotic  calcifications. Skull: No fracture or aggressive osseous lesion. Sinuses/Orbits: No mass or acute finding within the imaged orbits. No significant paranasal sinus disease at the imaged levels. ASPECTS [facility] Stroke Program Early CT Score) - Ganglionic level infarction (caudate, lentiform nuclei, internal capsule, insula, M1-M3 cortex): 7 - Supraganglionic infarction (M4-M6 cortex): 3 Total score (0-10 with 10 being normal): 10 No evidence of an acute intracranial abnormality. These results were communicated to Dr. Cheral Marker at 11:32 amon 3/19/2024by text page via the [facility] messaging system. IMPRESSION: 1.  No evidence of an acute intracranial abnormality. 2. Parenchymal atrophy and chronic small vessel disease, as described. Electronically Signed   By: Kellie Simmering D.O.   On: 01/08/2023 11:32     Assessment: 89 year old female presenting with acute onset of slurred speech and left hand weakness followed by rapid resolution. LKN 0830.  - Exam reveals right facial sensory deficit and poor orientation to time. No aphasia, focal limb weakness or ataxia noted on exam.  - CT head:  No evidence of an acute intracranial abnormality. Parenchymal atrophy and extensive chronic small vessel disease is noted.  - Per family, the patient was recently diagnosed with a UTI.  - EKG:  Sinus rhythm; Borderline prolonged QT interval - Most recent MRI in May of 2020 revealed moderately advanced T2 and FLAIR hyperintensities throughout the white matter, likely representing chronic microvascular ischemic change. No strokes were seen at that time. - Labs: - Mild hypokalemia.  - No leukocytosis - Overall presentation is most concerning for TIA. Possibly also with fatigue due to UTI with slow awakening resulting in slurred speech that then recovered. Hypertensive encephalopathy is possible but felt to be unlikely as BP was still high in the ED after her symptoms resolved.  - TNK not administered. Symptoms too mild to treat.   Recommendations: - TIA/Stroke work up to include MRI brain, CTA of head and neck, TTE and cardiac telemetry.  - BP management with modified permissive HTN protocol given her advanced age: Treat if SBP > 180. After 24 hours decrease BP by 20% per day to final goal of [id number] systolic.  - [id number], fasting lipid panel - PT consult, OT consult, Speech consult - Continue ASA. Add Plavix as she has most likely failed ASA monotherapy.  - Risk factor modification to include daily light exercise such as walking, as tolerated - Frequent neuro checks - NPO until passes stroke swallow screen  Addendum: - Patient with recurrence of slurred speech at 12:48 PM.  - Neurology paged at 1:35 PM and change discussed with Dr. Jori Moll. Patient out of the 4.5 hour TNK time window at that time - Patient re-examined with new right facial droop, slurred speech and right pronator drift relative to initial neurology evaluation. Naming and fluency intact. No sensory extinction, aphasia or neglect noted.  - Although presentation is not consistent with LVO, will order CTA head and neck STAT - Modified Rankin scale is 3, therefore not an optimal thrombectomy candidate - Loading with ASA additional 325 mg plus 300 mg Plavix now. Already has received 325 mg ASA in the ED.   Addendum: -  Patient now agitated and refusing to cooperate with nursing, stating that she wants to leave. She is yelling at some staff and actively moving around in her bed, becoming more agitated when attempting to re-examine. Continues to have right facial droop and dysarthria.   - Discussed risks/benefits of potential off label thrombectomy with family. After careful consideration they do not wish to pursue  this as a possible treatment modality.  - Benadryl 12.5 mg IV ordered for agitation with additional 12.5 mg as needed.  - Will hold off on CTA until patient's agitation resolves     Electronically signed: Dr. Kerney Elbe 01/08/2023, 11:41 AM

## 2023-01-08 NOTE — Evaluation (Cosign Needed Addendum)
Clinical/Bedside Swallow Evaluation Patient Details  Name: Bethany Hicks MRN: [id number] Date of Birth: 1928/01/10  Today's Date: 01/08/2023 Time: SLP Start Time (ACUTE ONLY): 0230 SLP Stop Time (ACUTE ONLY): 0330 SLP Time Calculation (min) (ACUTE ONLY): 60 min  Past Medical History:  Past Medical History:  Diagnosis Date   Anxiety    Basal cell carcinoma 10/07/2009   L central forehead   Fluid collection (edema) in the arms, legs, hands and feet    History of panic attacks    Hypertension    Hypothyroidism    Past Surgical History:  Past Surgical History:  Procedure Laterality Date   ABDOMINAL HYSTERECTOMY     CATARACT EXTRACTION W/PHACO Left 03/07/2015   Procedure: CATARACT EXTRACTION PHACO AND INTRAOCULAR LENS PLACEMENT ([place]);  Surgeon: Estill Cotta, MD;  Location: [facility] OR[facility];  Service: Ophthalmology;  Laterality: Left;  [place] 00:57 AP% 24.6 CDE 24.44   CHOLECYSTECTOMY     EYE SURGERY     cataract   ORIF ANKLE FRACTURE Right 01/13/2021   Procedure: OPEN REDUCTION INTERNAL FIXATION (ORIF) ANKLE FRACTURE-Bimalleolar;  Surgeon: Dereck Leep, MD;  Location: [facility] ORS;  Service: Orthopedics;  Laterality: Right;   HPI:  Per ED Provider note, pt "is a 89 y.o. female past medical history significant for Dementia who presents to the emergency department following an episode of slurred speech.  History is provided by the patient's daughter who is at bedside.  Patient's last known well was at 930 this morning.  Patient is at a facility and has a caregiver that comes in at 10 AM.  Patient was seen acting her normal self at 930 this morning at breakfast.  When the caregiver got there at 10 AM noted slurred speech and she was not acting her normal self.  [name] arrived and stated that her speech was off and significantly slurred, as they drove into the emergency department her speech improved.  States that she has had some mild altered mental status over the past 1 month.  Was  evaluated on Friday with a urine and lab work done at PCP.  Feels that her speech is now back to her normal.  Denies any extremity numbness or weakness.  No facial asymmetry.  No prior history of CVA.  Not on anticoagulation.  No recent falls or trauma.  Patient without complaints at this time.  Denies any dysuria.  Denies any chest pain or shortness of breath."    Assessment / Plan / Recommendation  Clinical Impression   BSE initiated. Pt initially alert, cooperative, and pleasant but grew more agitated, distracted, and uncooperative as the eval progressed. Family present. Will attempt complete BSE at a later time.  Pt on RA; afebrile; WBC WNL. OF NOTE: Pt w/ hx of baseline Dementia. ANY pt w/ cognitive decline is at increased risk of aspiration/aspiration pneumonia -- following aspiration precautions at all po's can help reduce risk of aspiration.  OM exam completed and notable for min reduced R labial ROM w/ retraction and min/mod lingual deviation to right w/ protrusion. Adequate natural dentition. Noted weak volitional cough. Pt able to follow one step commands.  Administered trials of ice chips, nectar thick liquids, and puree. Pt initially fed by ST but transitioned to being fed by daughter d/t increased agitation as eval progressed. Pt's oral phase likely impacted by her agitated state -- noted oral holding and use of mod verbal cues to initiate swallow. Pt's oral phase may have been negatively impacted by distracted behavior d/t agitation  in setting of Dementia. During pt's pharyngeal phase, noted cough x2 w/ nectar thick liquids. Otherwise, pharyngeal phase appeared [facility].  Pt/family educated on diet recommendation, SLP POC, and general compensatory strategies to decrease cognitive load in setting of Dementia. Family appreciative/agreed.  Recommend continue NPO diet w/ exception of applesauce for meds and for comfort. Recommend following aspiration precautions w/ all po's. Recommend supervision  w/ po's. ST will monitor pt in next 1-2 days for improvement and potential for diet upgrade/cognitive linguistic evaluation. RN/MD updated.  SLP Visit Diagnosis: Dysphagia, unspecified (R13.10)    Aspiration Risk  Severe aspiration risk;Moderate aspiration risk    Diet Recommendation  Recommend continue NPO diet w/ exception of applesauce for meds and for comfort.   Medication Administration: Crushed with puree    Other  Recommendations Oral Care Recommendations: Oral care BID;Oral care before and after PO    Recommendations for follow up therapy are one component of a multi-disciplinary discharge planning process, led by the attending physician.  Recommendations may be updated based on patient status, additional functional criteria and insurance authorization.  Follow up Recommendations Follow physician's recommendations for discharge plan and follow up therapies      Assistance Recommended at Discharge  Full  Functional Status Assessment Patient has had a recent decline in their functional status and/or demonstrates limited ability to make significant improvements in function in a reasonable and predictable amount of time  Frequency and Duration min 2x/week  2 weeks       Prognosis Prognosis for improved oropharyngeal function: Fair Barriers to Reach Goals: Cognitive deficits;Behavior;Severity of deficits      Swallow Study   General Date of Onset: 01/08/23 HPI: Per ED Provider note, pt "is a 89 y.o. female past medical history significant for Dementia who presents to the emergency department following an episode of slurred speech.  History is provided by the patient's daughter who is at bedside.  Patient's last known well was at 930 this morning.  Patient is at a facility and has a caregiver that comes in at 10 AM.  Patient was seen acting her normal self at 930 this morning at breakfast.  When the caregiver got there at 10 AM noted slurred speech and she was not acting her normal  self.  [name] arrived and stated that her speech was off and significantly slurred, as they drove into the emergency department her speech improved.  States that she has had some mild altered mental status over the past 1 month.  Was evaluated on Friday with a urine and lab work done at PCP.  Feels that her speech is now back to her normal.  Denies any extremity numbness or weakness.  No facial asymmetry.  No prior history of CVA.  Not on anticoagulation.  No recent falls or trauma.  Patient without complaints at this time.  Denies any dysuria.  Denies any chest pain or shortness of breath." Type of Study: Bedside Swallow Evaluation Previous Swallow Assessment: N/A Diet Prior to this Study: NPO Temperature Spikes Noted: No (WBC 6.6) Respiratory Status: Room air History of Recent Intubation: No Behavior/Cognition: Alert;Cooperative;Agitated;Confused;Uncooperative;Distractible Oral Cavity Assessment: Within Functional Limits Oral Care Completed by SLP: No Oral Cavity - Dentition: Adequate natural dentition Vision:  (N/A) Self-Feeding Abilities: Total assist Patient Positioning: Upright in bed Baseline Vocal Quality: Normal Volitional Cough: Weak Volitional Swallow: Able to elicit    Oral/Motor/Sensory Function Overall Oral Motor/Sensory Function: Moderate impairment Facial ROM: Reduced right Facial Symmetry: Abnormal symmetry right Facial Strength: Reduced right  Lingual ROM: Within Functional Limits Lingual Symmetry: Abnormal symmetry right Lingual Strength: Within Functional Limits Mandible: Within Functional Limits   Ice Chips Ice chips: Within functional limits Presentation: Spoon (x 4)   Thin Liquid Thin Liquid: Not tested    Nectar Thick Nectar Thick Liquid: Impaired (6+ sips) Presentation: Straw Oral Phase Impairments: Poor awareness of bolus Oral phase functional implications: Oral holding Pharyngeal Phase Impairments: Cough - Immediate   Honey Thick Honey Thick  Liquid: Not tested   Puree Puree: Within functional limits Presentation: Spoon (8+ trials)   Solid     Solid: Not tested     Randall Hiss Graduate Clinician [place], Speech Pathology   Randall Hiss 01/08/2023,3:49 PM

## 2023-01-08 NOTE — ED Provider Notes (Addendum)
[facility] Provider Note    Event Date/Time   First MD Initiated Contact with Patient 01/08/23 1148     (approximate)   History   Code Stroke   HPI  Bethany Hicks is a 89 y.o. female past medical history significant for dementia who presents to the emergency department following an episode of slurred speech.  History is provided by the patient's daughter who is at bedside.  Patient's last known well was at 930 this morning.  Patient is at a facility and has a caregiver that comes in at 10 AM.  Patient was seen acting her normal self at 930 this morning at breakfast.  When the caregiver got there at 10 AM noted slurred speech and she was not acting her normal self.  [name] arrived and stated that her speech was off and significantly slurred, as they drove into the emergency department her speech improved.  States that she has had some mild altered mental status over the past 1 month.  Was evaluated on Friday with a urine and lab work done at PCP.  Feels that her speech is now back to her normal.  Denies any extremity numbness or weakness.  No facial asymmetry.  No prior history of CVA.  Not on anticoagulation.  No recent falls or trauma.  Patient without complaints at this time.  Denies any dysuria.  Denies any chest pain or shortness of breath.     Physical Exam   Triage Vital Signs: ED Triage Vitals  Enc Vitals Group     BP 01/08/23 1132 (!) 192/95     Pulse Rate 01/08/23 1133 66     Resp 01/08/23 1133 16     Temp 01/08/23 1133 98 F (36.7 C)     Temp Source 01/08/23 1133 Oral     SpO2 01/08/23 1133 95 %     Weight 01/08/23 1146 167 lb (75.8 kg)     Height 01/08/23 1146 5\' 11"  (1.803 m)     Head Circumference --      Peak Flow --      Pain Score 01/08/23 1146 0     Pain Loc --      Pain Edu? --      Excl. in [place]? --     Most recent vital signs: Vitals:   01/08/23 1133 01/08/23 1145  BP:  (!) 195/84  Pulse: 66 (!) 58  Resp: 16 18   Temp: 98 F (36.7 C) 98 F (36.7 C)  SpO2: 95% 97%    Physical Exam Constitutional:      Appearance: She is well-developed.  HENT:     Head: Atraumatic.  Eyes:     General: Visual field deficit (R eye with VF deficit to R lower (baseline from old stroke according to family)) present.     Conjunctiva/sclera: Conjunctivae normal.  Cardiovascular:     Rate and Rhythm: Regular rhythm.  Pulmonary:     Effort: No respiratory distress.  Abdominal:     General: There is no distension.  Musculoskeletal:        General: Normal range of motion.     Cervical back: Normal range of motion.  Skin:    General: Skin is warm.  Neurological:     General: No focal deficit present.     Mental Status: She is alert. Mental status is at baseline.     Sensory: Sensation is intact.     Motor: Motor function is intact.  Coordination: Coordination is intact.     Comments: Able to state her name and date of birth.  Not oriented to time.  This is at her baseline with her history of dementia.     IMPRESSION / MDM / ASSESSMENT AND PLAN / ED COURSE  I reviewed the triage vital signs and the nursing notes.  Patient presented to the emergency department with slurred speech.  Activated code stroke on arrival with EMS.  Consulted neurology who is at bedside with Dr. Alvy Beal, CT scan of the head without signs of intracranial hemorrhage or infarction.  After coming back to the room patient had resolution of her symptoms.  Not a TNK candidate given that the patient does not have any focal neurologic deficits at this time.  NIH scale of 0.  Differential diagnosis including TIA/CVA, electrolyte abnormality, dysrhythmia, dehydration, UTI  EKG  I, Nathaniel Man, the attending physician, personally viewed and interpreted this ECG.   Rate: Normal  Rhythm: Normal sinus  Axis: Normal  Intervals: Normal  ST&T Change: None  No tachycardic or bradycardic dysrhythmias while on cardiac telemetry.  RADIOLOGY I  independently reviewed imaging, my interpretation of imaging: CT scan of the head shows no signs of intracranial hemorrhage or infarction.  LABS (all labs ordered are listed, but only abnormal results are displayed) Labs interpreted as -    Labs Reviewed  COMPREHENSIVE METABOLIC PANEL - Abnormal; Notable for the following components:      Result Value   Potassium 3.3 (*)    Albumin 3.2 (*)    All other components within normal limits  PROTIME-INR  APTT  CBC  DIFFERENTIAL  ETHANOL  URINALYSIS, W/ REFLEX TO CULTURE (INFECTION SUSPECTED)  I-STAT CREATININE, ED  CBG MONITORING, ED    TREATMENT  Aspirin 324 mg, 40 mill equivalents of potassium, 1 L of IV fluids  MDM On arrival to the emergency department patient was activated code stroke.  After CT scan patient's symptoms of slurred speech have resolved.  Given her resolution of symptoms no longer a TNK candidate.  Patient was evaluated by neurology.  CT scan of the head with no signs of intracranial hemorrhage or infarction.  Lab work overall unremarkable with no significant electrolyte abnormalities.  Mild hypokalemia.  Consulted hospitalist for admission for TIA/CVA workup.  Clinical Course as of 01/08/23 1335  Tue Jan 08, 2023  1334 1:35 PM Called into the room by hospitalist -recurrence of slurred speech and now has a right-sided facial droop.  Repaged neurology for ongoing code stroke. [SM]    Clinical Course User Index [SM] Nathaniel Man, MD   Discussed with Neurology, outside of the window for TNK, will, reevaluate the patient in the emergency department. PROCEDURES:  Critical Care performed: yes  .Critical Care  Performed by: Nathaniel Man, MD Authorized by: Nathaniel Man, MD   Critical care provider statement:    Critical care time (minutes):  30   Critical care time was exclusive of:  Separately billable procedures and treating other patients   Critical care was necessary to treat or prevent imminent or  life-threatening deterioration of the following conditions:  CNS failure or compromise   Critical care was time spent personally by me on the following activities:  Development of treatment plan with patient or surrogate, discussions with consultants, evaluation of patient's response to treatment, examination of patient, ordering and review of laboratory studies, ordering and review of radiographic studies, ordering and performing treatments and interventions, pulse oximetry, re-evaluation of patient's condition and review  of old charts   Patient's presentation is most consistent with acute presentation with potential threat to life or bodily function.   MEDICATIONS ORDERED IN ED: Medications  potassium chloride SA (KLOR-CON M) CR tablet 40 mEq (has no administration in time range)  sodium chloride flush (NS) 0.9 % injection 3 mL (3 mLs Intravenous Given 01/08/23 1159)  sodium chloride 0.9 % bolus 1,000 mL (1,000 mLs Intravenous New Bag/Given 01/08/23 1156)  aspirin chewable tablet 324 mg ( Oral Not Given 01/08/23 1207)    FINAL CLINICAL IMPRESSION(S) / ED DIAGNOSES   Final diagnoses:  Slurred speech  TIA (transient ischemic attack)     Rx / DC Orders   ED Discharge Orders     None        Note:  This document was prepared using Dragon voice recognition software and may include unintentional dictation errors.   Nathaniel Man, MD 01/08/23 1235    Nathaniel Man, MD 01/08/23 [id number]    Nathaniel Man, MD 01/08/23 1430

## 2023-01-08 NOTE — Assessment & Plan Note (Signed)
PPI ?

## 2023-01-08 NOTE — ED Triage Notes (Signed)
Pt came to room 11 after CT w c/o weakness in rt arm, rt side facial weakness and sensitivity per stroke nurse. Previou slurred speech has resolved. Last well known between 9-930 am

## 2023-01-08 NOTE — Assessment & Plan Note (Addendum)
Cont namenda, exelon,

## 2023-01-08 NOTE — ED Notes (Signed)
Pt has a new slurring noted, Dr Jori Moll notified

## 2023-01-09 ENCOUNTER — Encounter: Payer: Self-pay | Admitting: Family Medicine

## 2023-01-09 ENCOUNTER — Other Ambulatory Visit: Payer: Self-pay

## 2023-01-09 DIAGNOSIS — E7800 Pure hypercholesterolemia, unspecified: Secondary | ICD-10-CM

## 2023-01-09 DIAGNOSIS — G8191 Hemiplegia, unspecified affecting right dominant side: Secondary | ICD-10-CM | POA: Diagnosis present

## 2023-01-09 DIAGNOSIS — E876 Hypokalemia: Secondary | ICD-10-CM | POA: Diagnosis present

## 2023-01-09 DIAGNOSIS — Z85828 Personal history of other malignant neoplasm of skin: Secondary | ICD-10-CM | POA: Diagnosis not present

## 2023-01-09 DIAGNOSIS — Z90710 Acquired absence of both cervix and uterus: Secondary | ICD-10-CM | POA: Diagnosis not present

## 2023-01-09 DIAGNOSIS — F03B11 Unspecified dementia, moderate, with agitation: Secondary | ICD-10-CM

## 2023-01-09 DIAGNOSIS — Z1152 Encounter for screening for COVID-19: Secondary | ICD-10-CM | POA: Diagnosis not present

## 2023-01-09 DIAGNOSIS — I6389 Other cerebral infarction: Secondary | ICD-10-CM | POA: Diagnosis present

## 2023-01-09 DIAGNOSIS — Z66 Do not resuscitate: Secondary | ICD-10-CM | POA: Diagnosis present

## 2023-01-09 DIAGNOSIS — Z885 Allergy status to narcotic agent status: Secondary | ICD-10-CM | POA: Diagnosis not present

## 2023-01-09 DIAGNOSIS — R1319 Other dysphagia: Secondary | ICD-10-CM | POA: Diagnosis not present

## 2023-01-09 DIAGNOSIS — R29810 Facial weakness: Secondary | ICD-10-CM | POA: Diagnosis present

## 2023-01-09 DIAGNOSIS — F03B0 Unspecified dementia, moderate, without behavioral disturbance, psychotic disturbance, mood disturbance, and anxiety: Secondary | ICD-10-CM | POA: Diagnosis not present

## 2023-01-09 DIAGNOSIS — K219 Gastro-esophageal reflux disease without esophagitis: Secondary | ICD-10-CM

## 2023-01-09 DIAGNOSIS — G309 Alzheimer's disease, unspecified: Secondary | ICD-10-CM | POA: Diagnosis present

## 2023-01-09 DIAGNOSIS — Z79890 Hormone replacement therapy (postmenopausal): Secondary | ICD-10-CM | POA: Diagnosis not present

## 2023-01-09 DIAGNOSIS — I672 Cerebral atherosclerosis: Secondary | ICD-10-CM | POA: Diagnosis present

## 2023-01-09 DIAGNOSIS — R1312 Dysphagia, oropharyngeal phase: Secondary | ICD-10-CM | POA: Diagnosis not present

## 2023-01-09 DIAGNOSIS — I10 Essential (primary) hypertension: Secondary | ICD-10-CM | POA: Diagnosis present

## 2023-01-09 DIAGNOSIS — Z515 Encounter for palliative care: Secondary | ICD-10-CM | POA: Diagnosis not present

## 2023-01-09 DIAGNOSIS — E039 Hypothyroidism, unspecified: Secondary | ICD-10-CM | POA: Diagnosis present

## 2023-01-09 DIAGNOSIS — R638 Other symptoms and signs concerning food and fluid intake: Secondary | ICD-10-CM | POA: Diagnosis not present

## 2023-01-09 DIAGNOSIS — R4781 Slurred speech: Secondary | ICD-10-CM | POA: Diagnosis not present

## 2023-01-09 DIAGNOSIS — Z87891 Personal history of nicotine dependence: Secondary | ICD-10-CM | POA: Diagnosis not present

## 2023-01-09 DIAGNOSIS — Z8249 Family history of ischemic heart disease and other diseases of the circulatory system: Secondary | ICD-10-CM | POA: Diagnosis not present

## 2023-01-09 DIAGNOSIS — Z881 Allergy status to other antibiotic agents status: Secondary | ICD-10-CM | POA: Diagnosis not present

## 2023-01-09 DIAGNOSIS — Z888 Allergy status to other drugs, medicaments and biological substances status: Secondary | ICD-10-CM | POA: Diagnosis not present

## 2023-01-09 DIAGNOSIS — G459 Transient cerebral ischemic attack, unspecified: Secondary | ICD-10-CM | POA: Diagnosis present

## 2023-01-09 DIAGNOSIS — I639 Cerebral infarction, unspecified: Secondary | ICD-10-CM | POA: Diagnosis not present

## 2023-01-09 DIAGNOSIS — F0280 Dementia in other diseases classified elsewhere without behavioral disturbance: Secondary | ICD-10-CM | POA: Diagnosis present

## 2023-01-09 DIAGNOSIS — R4701 Aphasia: Secondary | ICD-10-CM | POA: Diagnosis present

## 2023-01-09 DIAGNOSIS — Z7189 Other specified counseling: Secondary | ICD-10-CM | POA: Diagnosis not present

## 2023-01-09 DIAGNOSIS — G9340 Encephalopathy, unspecified: Secondary | ICD-10-CM | POA: Diagnosis not present

## 2023-01-09 DIAGNOSIS — F0150 Vascular dementia without behavioral disturbance: Secondary | ICD-10-CM | POA: Diagnosis present

## 2023-01-09 DIAGNOSIS — F0390 Unspecified dementia without behavioral disturbance: Secondary | ICD-10-CM | POA: Diagnosis not present

## 2023-01-09 LAB — LIPID PANEL
Cholesterol: 237 mg/dL — ABNORMAL HIGH (ref 0–200)
HDL: 59 mg/dL (ref >40)
LDL Cholesterol: 157 mg/dL — ABNORMAL HIGH (ref 0–99)
Total CHOL/HDL Ratio: 4 RATIO
Triglycerides: 106 mg/dL (ref <150)
VLDL: 21 mg/dL (ref 0–40)

## 2023-01-09 LAB — COMPREHENSIVE METABOLIC PANEL
ALT: 9 U/L (ref 0–44)
AST: 19 U/L (ref 15–41)
Albumin: 3.3 g/dL — ABNORMAL LOW (ref 3.5–5.0)
Alkaline Phosphatase: 77 U/L (ref 38–126)
Anion gap: 8 (ref 5–15)
BUN: 10 mg/dL (ref 8–23)
CO2: 28 mmol/L (ref 22–32)
Calcium: 8.8 mg/dL — ABNORMAL LOW (ref 8.9–10.3)
Chloride: 103 mmol/L (ref 98–111)
Creatinine, Ser: 0.78 mg/dL (ref 0.44–1.00)
GFR, Estimated: >60 mL/min (ref >60)
Glucose, Bld: 92 mg/dL (ref 70–99)
Potassium: 3.5 mmol/L (ref 3.5–5.1)
Sodium: 139 mmol/L (ref 135–145)
Total Bilirubin: 0.9 mg/dL (ref 0.3–1.2)
Total Protein: 6.9 g/dL (ref 6.5–8.1)

## 2023-01-09 LAB — URINALYSIS, W/ REFLEX TO CULTURE (INFECTION SUSPECTED)
Bacteria, UA: NONE SEEN
Bilirubin Urine: NEGATIVE
Glucose, UA: NEGATIVE mg/dL
Hgb urine dipstick: NEGATIVE
Ketones, ur: NEGATIVE mg/dL
Leukocytes,Ua: NEGATIVE
Nitrite: NEGATIVE
Protein, ur: NEGATIVE mg/dL
Specific Gravity, Urine: 1.015 (ref 1.005–1.030)
pH: 8 (ref 5.0–8.0)

## 2023-01-09 LAB — GLUCOSE, CAPILLARY
Glucose-Capillary: 80 mg/dL (ref 70–99)
Glucose-Capillary: 79 mg/dL (ref 70–99)
Glucose-Capillary: 113 mg/dL — ABNORMAL HIGH (ref 70–99)

## 2023-01-09 LAB — CBC
HCT: 39.9 % (ref 36.0–46.0)
Hemoglobin: 12.5 g/dL (ref 12.0–15.0)
MCH: 27.2 pg (ref 26.0–34.0)
MCHC: 31.3 g/dL (ref 30.0–36.0)
MCV: 86.7 fL (ref 80.0–100.0)
Platelets: 195 10*3/uL (ref 150–400)
RBC: 4.6 MIL/uL (ref 3.87–5.11)
RDW: 14.9 % (ref 11.5–15.5)
WBC: 7.7 10*3/uL (ref 4.0–10.5)
nRBC: 0 % (ref 0.0–0.2)

## 2023-01-09 LAB — ECHOCARDIOGRAM COMPLETE
Area-P 1/2: 2.29 cm2
Est EF: >55
Height: 71 in
S' Lateral: 2 cm
Weight: 2672 oz

## 2023-01-09 LAB — HEMOGLOBIN A1C
Hgb A1c MFr Bld: 5.7 % — ABNORMAL HIGH (ref 4.8–5.6)
Mean Plasma Glucose: 117 mg/dL

## 2023-01-09 MED ORDER — DEXTROSE IN LACTATED RINGERS 5 % IV SOLN: INTRAVENOUS | Status: DC

## 2023-01-09 NOTE — Progress Notes (Addendum)
CTA of head and neck is negative for LVO. There is moderate intracranial atherosclerotic disease without proximal high-grade or correctable stenosis. Mild atheromatous change about the carotid bifurcations without stenosis. No hemodynamically significant stenosis within the neck.  MRI brain: 1.2 cm acute ischemic nonhemorrhagic infarct involving the posterior limb of the left internal capsule. Underlying age-related cerebral atrophy with severe chronic microvascular ischemic disease.  Electronically signed: Dr. Kerney Elbe

## 2023-01-09 NOTE — Progress Notes (Signed)
SLP Cancellation Note  Patient Details Name: Bethany Hicks MRN: [id number] DOB: [birth year]   Cancelled treatment:       Reason Eval/Treat Not Completed: Patient not medically ready;Fatigue/lethargy limiting ability to participate;Patient's level of consciousness (chart reviewed; consulted NSG/MD and met w/ Dtr in room.)  In setting of pt's declined Cognitive status(Baseline Dementia) and sedating medication yesterday for tests(per NSG) causing lethargy and poor arousal, will hold on any cog-linguistic assessment at this time -- suspect pt will need assessment for Dysarthria d/t apparent OM weakness and slurred speech at admit/BSE(see report).  Education provided on above; recommended Family encourage pt to use Slow, Louder speech to increase intelligibility(over-articulate). Dtr agreed. ST services will f/u next 1-2 days. MD updated.      Orinda Kenner, MS, CCC-SLP Speech Language Pathologist Rehab Services; [facility] [phone] (ascom) Adonte Vanriper 01/09/2023, 4:24 PM

## 2023-01-09 NOTE — Hospital Course (Addendum)
Bethany Hicks is a 89 y.o. female with medical history significant of hypertension, hypothyroidism, dementia presenting with CVA.  Patient noted with slurred speech at facility around 930 this morning.  This is well off baseline per report.  Per report, patient was eating breakfast like normal prior to this.  Family was called to which patient's daughter went to evaluate the patient with patient noted slurred speech.  Patient was then taken to the ER by car with improvement in symptoms upon arrival.  03/19: Code stroke was called upon arrival.  Initial imaging grossly negative.  Of note patient also had some mild confusion within the past month including urine and labs that were done at primary care's office.  No reports of fevers or chills.  Reports no nausea or vomiting.  Reports remote history of CVA diagnosed years ago as an incidental finding. Presented to the ER afebrile, systolic pressures in the 180s to 190s, satting well on room air.  White count 6.6, hemoglobin 12.3, potassium 3.3, creatinine 0.8.  Initial code stroke CT head grossly stable.  Upon evaluation by admitting hospitalist, patient had new onset left-sided facial droop to which a repeat code stroke was called with neurology reevaluation. formal CVA evaluation including MRI of the brain, CTA of the head neck, TTE, risk stratification labs. On baby aspirin chronically. S/p plavix load in ER. PT agitation precluded timely CTA evaluation.  03/20: CTA H/N neg LVO, moderate atherosclerotic disease. MRI brain 1.2 cm acute ischemic nonhemorrhagic infarct involving the posterior limb of the left internal capsule. Underlying age-related cerebral atrophy with severe chronic microvascular ischemic disease. PT/OT eval pending. Long discussion today w/ family at bedside re: poor prognosis if patient is not recovering her alertness / po intake. I am concerned for vascular dementia as cause of sudden change, in addition to CVA effects, and if this is the case I  am concerned she is unlikely to recover. Will request additional input from neurology team re: prognostication. Discussed code status with daughter - given how patient is looking now, I hesitate to offer feeding tube of any kind pending neurology opinion, and I hesitate to offer CPR/intubation as I do not feel she would recover from resuscitation measures. Will leave as FULL CODE for now but daughter would like to speak w/ family about this further and we will revisit the topic tomorrow.  03/21: see IPAL note - pt DNR, palliative consult. Repeating CT head today - no new stroke. IR to place NG for nutrition --> unsuccessful. Palliative care discussion w/ family following unsuccessful NG placement, they are opting for hospice since NG unsuccessful and they do not wish to pursue PEG tube. TOC aware, hospice to reach out to daughter(s) tomorrow  03/22: stable. Pt goes back and forth alert/somnolent, difficult to understand speech but seems to be asking for water/food. Palliative and hospice to address w/ family re: comfort feeds. D/c po meds for now except ASA/plavix, BP meds IV, may consider comfort measures and avoid po meds altogether   Consultants:  Neurology   Procedures: None       ASSESSMENT & PLAN:   Principal Problem:   CVA (cerebral vascular accident) ([place]) Active Problems:   Hypothyroidism   HYPERCHOLESTEROLEMIA, PURE   Essential hypertension   GERD   Dementia ([place])  CVA (cerebral vascular accident) DAPT: ASA + Plavix as able  Hospice consult    Dysphagia as effect of CVA vs advancing dementia  NPO NG per IR attempted, not able to pass NG SLP  following  Considering comfort feeds Minimize po meds   Dementia D/c namenda, exelon d/t NPO   GERD D/c po PPI    Essential hypertension Unable to take po meds  Hydralazine IV scheduled Labetalol IV prn severe HTN   HLD D/c statin d/t NPO  Hypothyroidism Cont synthroid if/when able     DVT prophylaxis: lovenox   Pertinent IV fluids/nutrition: npo / consider comfort feeds  Central lines / invasive devices: none   Code Status: DNR   Current Admission Status: inpatient   TOC needs / Dispo plan: pend hospice eval  Barriers to discharge / significant pending items: clinical improvement vs consideration for transition to hospice / comfort measures if not improving or if worsening. Expect may be here through the weekend pending placement/DME/home

## 2023-01-09 NOTE — Evaluation (Signed)
Occupational Therapy Evaluation Patient Details Name: Bethany Hicks MRN: [id number] DOB: [birth year] Today's Date: 01/09/2023   History of Present Illness 89 y.o. female with medical history significant of hypertension, hypothyroidism, dementia presenting with CVA.  Patient noted with slurred speech at facility around 930 this morning.  This is well off baseline per report.  Per report, patient was eating breakfast like normal prior to this.  Family was called to which patient's daughter went to evaluate the patient with patient noted slurred speech.  Patient was then taken to the ER by car with improvement in symptoms upon arrival.  Code stroke was called upon arrival.  Initial imaging grossly negative.  Of note patient also had some mild confusion within the past month including urine and labs that were done at primary care's office.   Clinical Impression   Patient presenting with decreased Ind in self care,balance, functional mobility/transfers, endurance, and safety awareness. [name] present in the room and reports pt is mod I with RW at ALF. She does need assistance with self care tasks for safety and generally knows her family when she sees them. Pt with L gaze preference in room and closing L eye frequently. Pt with significant weakness on R UE and LE. Pt needing total A for bed mobility and R lateral lean requiring mod - max A for static sitting balance. Patient is very lethargic throughout.  Patient will benefit from acute OT to increase overall independence in the areas of ADLs, functional mobility, and safety awareness  in order to safely discharge to next venue of care.      Recommendations for follow up therapy are one component of a multi-disciplinary discharge planning process, led by the attending physician.  Recommendations may be updated based on patient status, additional functional criteria and insurance authorization.   Follow Up Recommendations  Skilled nursing-short term  rehab (<3 hours/day)     Assistance Recommended at Discharge Frequent or constant Supervision/Assistance  Patient can return home with the following Two people to help with bathing/dressing/bathroom;A lot of help with walking and/or transfers;Assist for transportation;Assistance with cooking/housework;Help with stairs or ramp for entrance    Functional Status Assessment  Patient has had a recent decline in their functional status and demonstrates the ability to make significant improvements in function in a reasonable and predictable amount of time.  Equipment Recommendations  Other (comment) (defer to next venue of care)       Precautions / Restrictions Precautions Precautions: Fall      Mobility Bed Mobility Overal bed mobility: Needs Assistance Bed Mobility: Supine to Sit, Sit to Supine     Supine to sit: Total assist Sit to supine: Total assist   General bed mobility comments: Pt gives less than 10% effort towards bed mobility    Transfers                   General transfer comment: not attempted secondary to concerns.      Balance Overall balance assessment: Mild deficits observed, not formally tested                                         ADL either performed or assessed with clinical judgement   ADL  General ADL Comments: total A     Vision   Additional Comments: Unsure, pt closing L eye during session. Unable to follow commands for formal assessment and pt unable to verbalize vision concerns.            Pertinent Vitals/Pain Pain Assessment Pain Assessment: Faces Faces Pain Scale: Hurts little more Pain Location: generalized Pain Descriptors / Indicators: Discomfort Pain Intervention(s): Monitored during session, Repositioned     Hand Dominance Right   Extremity/Trunk Assessment Upper Extremity Assessment Upper Extremity Assessment: RUE  deficits/detail;Generalized weakness RUE Deficits / Details: 2-/5 throughout but difficult to determine further secondary to cognitive deficts. PROM WFLs.           Communication Communication Communication: No difficulties   Cognition Arousal/Alertness: Lethargic Behavior During Therapy: Flat affect Overall Cognitive Status: History of cognitive impairments - at baseline                                 General Comments: Pt with very slurred speech. Follows commands with mod multimodal cuing but very lethargic throughout                [place] expects to be discharged to:: Assisted living                             Home Equipment: Conservation officer, nature (2 wheels)          Prior Functioning/Environment Prior Level of Function : Needs assist               ADLs Comments: Staff assisted with bathing and dressing but pt ambulates with RW        OT Problem List: Decreased strength;Decreased activity tolerance;Decreased safety awareness;Impaired balance (sitting and/or standing);Decreased knowledge of use of DME or AE;Decreased range of motion;Decreased cognition;Impaired sensation;Impaired UE functional use      OT Treatment/Interventions: Self-care/ADL training;Therapeutic exercise;Therapeutic activities;Energy conservation;DME and/or AE instruction;Patient/family education;Balance training    OT Goals(Current goals can be found in the care plan section) Acute Rehab OT Goals Patient Stated Goal: to get better OT Goal Formulation: With family Time For Goal Achievement: 01/23/23 Potential to Achieve Goals: Fair ADL Goals Pt Will Perform Grooming: sitting;with min assist Pt Will Perform Lower Body Dressing: with mod assist;sit to/from stand Pt Will Transfer to Toilet: with mod assist;stand pivot transfer Pt Will Perform Toileting - Clothing Manipulation and hygiene: with mod assist;sit to/from stand  OT Frequency: Min  2X/week       AM-PAC OT "6 Clicks" Daily Activity     Outcome Measure Help from another person eating meals?: Total Help from another person taking care of personal grooming?: Total Help from another person toileting, which includes using toliet, bedpan, or urinal?: Total Help from another person bathing (including washing, rinsing, drying)?: Total Help from another person to put on and taking off regular upper body clothing?: Total Help from another person to put on and taking off regular lower body clothing?: Total 6 Click Score: 6   End of Session Nurse Communication: Mobility status  Activity Tolerance: Patient tolerated treatment well Patient left: in bed;with call bell/phone within reach;with bed alarm set  OT Visit Diagnosis: Unsteadiness on feet (R26.81);Repeated falls (R29.6);Muscle weakness (generalized) (M62.81);Hemiplegia and hemiparesis Hemiplegia - Right/Left: Right Hemiplegia - dominant/non-dominant: Dominant                Time: [id number] OT  Time Calculation (min): 20 min Charges:  OT General Charges $OT Visit: 1 Visit OT Evaluation $OT Eval Moderate Complexity: 1 Mod OT Treatments $Therapeutic Activity: 8-22 mins  Darleen Crocker, MS, OTR/L , CBIS ascom [phone]  01/09/23, 1:31 PM

## 2023-01-09 NOTE — Progress Notes (Signed)
PT Cancellation Note  Patient Details Name: Bethany Hicks MRN: [id number] DOB: 05/05/1928   Cancelled Treatment:    Reason Eval/Treat Not Completed: Patient's level of consciousness.  Pt unable to sustain being awake for >30 seconds at a time.  Nursing arriving to give pt meds as well.  Pt' daughter in room and discussed plan to return in afternoon is pt is more alert/oriented.     Gwenlyn Saran, PT, DPT Physical Therapist - [facility]  01/09/23, 10:18 AM

## 2023-01-09 NOTE — Progress Notes (Signed)
PT Cancellation Note  Patient Details Name: Bethany Hicks MRN: [id number] DOB: 1927/11/04   Cancelled Treatment:    Reason Eval/Treat Not Completed: Patient's level of consciousness.  Attempted to see pt in PM, however pt still lethargic and not responding.  Pt's daughter requested for her to be seen tomorrow if possible, to allow the medications to wear off.  Will re-attempt at a later date and time as medically appropriate.   Gwenlyn Saran, PT, DPT Physical Therapist - [facility]  01/09/23, 3:44 PM

## 2023-01-09 NOTE — Progress Notes (Addendum)
Subjective: Mildly agitated when aroused and asked to participate in exam.   Objective: Current vital signs: BP (!) 178/89 (BP Location: Right Arm)   Pulse 81   Temp 98.3 F (36.8 C) (Oral)   Resp 18   Ht 5\' 11"  (1.803 m)   Wt 75.8 kg   SpO2 94%   BMI 23.29 kg/m  Vital signs in last 24 hours: Temp:  [97.8 F (36.6 C)-98.7 F (37.1 C)] 98.3 F (36.8 C) (03/20 1602) Pulse Rate:  [58-81] 81 (03/20 1602) Resp:  [16-20] 18 (03/20 1602) BP: (156-194)/(85-151) 178/89 (03/20 1602) SpO2:  [94 %-99 %] 94 % (03/20 1602)  Intake/Output from previous day: No intake/output data recorded. Intake/Output this shift: Total I/O In: -  Out: 850 [Urine:850] Nutritional status:  Diet Order             Diet NPO time specified Except for: Other (See Comments)  Diet effective now                  Physical Exam  HEENT-  [place]/AT    Lungs- Respirations unlabored Extremities- Warm and well perfused   Neurological Examination Mental Status: Awake with decreased level of alertness. Poor orientation. Severe dysarthria. Speech is sparse and slow, but fluent. Some naming impairment in the context of decreased attention. Able to follow all simple commands. Poverty of thought (alogia).   Cranial Nerves: II: Not cooperative with visual fields exam. Will fixate on examiner's face during conversation.   III,IV, VI: No ptosis. EOMI. No nystagmus. V: Not cooperative VII: Right facial droop VIII: HOH IX,X: No hypophonia or hoarseness XI: Symmetric XII: Midline tongue extension Motor: Mild right sided weakness of UE and LE LUE: 5/5 LLE: 5/5 Sensory: Temp and FT intact x 4. No extinction to DSS. Deep Tendon Reflexes: 2+ and symmetric bilateral biceps, brachioradialis and patellae Cerebellar: No ataxia with FNF bilaterally Gait: Deferred  Lab Results: Results for orders placed or performed during the hospital encounter of 01/08/23 (from the past 48 hour(s))  CBG monitoring, ED     Status: None    Collection Time: 01/08/23 11:09 AM  Result Value Ref Range   Glucose-Capillary 81 70 - 99 mg/dL    Comment: Glucose reference range applies only to samples taken after fasting for at least 8 hours.  Protime-INR     Status: None   Collection Time: 01/08/23 11:12 AM  Result Value Ref Range   Prothrombin Time 13.4 11.4 - 15.2 seconds   INR 1.0 0.8 - 1.2    Comment: (NOTE) INR goal varies based on device and disease states. Performed at [facility], [address] [place], [place] [address]   APTT     Status: None   Collection Time: 01/08/23 11:12 AM  Result Value Ref Range   aPTT 26 24 - 36 seconds    Comment: Performed at [facility], [address] [place], [place] [address]  CBC     Status: None   Collection Time: 01/08/23 11:12 AM  Result Value Ref Range   WBC 6.6 4.0 - 10.5 K/uL   RBC 4.49 3.87 - 5.11 MIL/uL   Hemoglobin 12.3 12.0 - 15.0 g/dL   HCT 39.6 36.0 - 46.0 %   MCV 88.2 80.0 - 100.0 fL   MCH 27.4 26.0 - 34.0 pg   MCHC 31.1 30.0 - 36.0 g/dL   RDW 14.9 11.5 - 15.5 %   Platelets 227 150 - 400 K/uL   nRBC 0.0 0.0 -  0.2 %    Comment: Performed at [facility], [address] [place], [place] [address]  Differential     Status: None   Collection Time: 01/08/23 11:12 AM  Result Value Ref Range   Neutrophils Relative % 63 %   Neutro Abs 4.2 1.7 - 7.7 K/uL   Lymphocytes Relative 25 %   Lymphs Abs 1.6 0.7 - 4.0 K/uL   Monocytes Relative 9 %   Monocytes Absolute 0.6 0.1 - 1.0 K/uL   Eosinophils Relative 2 %   Eosinophils Absolute 0.1 0.0 - 0.5 K/uL   Basophils Relative 1 %   Basophils Absolute 0.0 0.0 - 0.1 K/uL   Immature Granulocytes 0 %   Abs Immature Granulocytes 0.02 0.00 - 0.07 K/uL    Comment: Performed at [facility], [address] [place], [place] [address]  Comprehensive metabolic panel     Status: Abnormal   Collection Time: 01/08/23 11:12 AM  Result Value Ref Range   Sodium 141 135 - 145 mmol/L    Potassium 3.3 (L) 3.5 - 5.1 mmol/L   Chloride 102 98 - 111 mmol/L   CO2 30 22 - 32 mmol/L   Glucose, Bld 81 70 - 99 mg/dL    Comment: Glucose reference range applies only to samples taken after fasting for at least 8 hours.   BUN 13 8 - 23 mg/dL   Creatinine, Ser 0.78 0.44 - 1.00 mg/dL   Calcium 8.9 8.9 - 10.3 mg/dL   Total Protein 6.9 6.5 - 8.1 g/dL   Albumin 3.2 (L) 3.5 - 5.0 g/dL   AST 15 15 - 41 U/L   ALT 10 0 - 44 U/L   Alkaline Phosphatase 82 38 - 126 U/L   Total Bilirubin 0.5 0.3 - 1.2 mg/dL   GFR, Estimated >60 >60 mL/min    Comment: (NOTE) Calculated using the CKD-EPI Creatinine Equation (2021)    Anion gap 9 5 - 15    Comment: Performed at [facility], [address], [place], [place] [address]  Ethanol     Status: None   Collection Time: 01/08/23 11:12 AM  Result Value Ref Range   Alcohol, Ethyl (B) <10 <10 mg/dL    Comment: (NOTE) Lowest detectable limit for serum alcohol is 10 mg/dL.  For medical purposes only. Performed at [facility], [address] [place], [place] [address]   Hemoglobin A1c     Status: Abnormal   Collection Time: 01/08/23 11:12 AM  Result Value Ref Range   Hgb A1c MFr Bld 5.7 (H) 4.8 - 5.6 %    Comment: (NOTE)         Prediabetes: 5.7 - 6.4         Diabetes: >6.4         Glycemic control for adults with diabetes: <7.0    Mean Plasma Glucose 117 mg/dL    Comment: (NOTE) Performed At: [facility], [place] [id number] Rush Farmer MD YC:[id number]   Glucose, capillary     Status: None   Collection Time: 01/08/23  8:33 PM  Result Value Ref Range   Glucose-Capillary 94 70 - 99 mg/dL    Comment: Glucose reference range applies only to samples taken after fasting for at least 8 hours.  Glucose, capillary     Status: None   Collection Time: 01/09/23  7:32 AM  Result Value Ref Range   Glucose-Capillary 80 70 - 99 mg/dL    Comment: Glucose reference  range applies only to samples  taken after fasting for at least 8 hours.  Lipid panel     Status: Abnormal   Collection Time: 01/09/23  8:20 AM  Result Value Ref Range   Cholesterol 237 (H) 0 - 200 mg/dL   Triglycerides 106 <150 mg/dL   HDL 59 >40 mg/dL   Total CHOL/HDL Ratio 4.0 RATIO   VLDL 21 0 - 40 mg/dL   LDL Cholesterol 157 (H) 0 - 99 mg/dL    Comment:        Total Cholesterol/HDL:CHD Risk Coronary Heart Disease Risk Table                     Men   Women  1/2 Average Risk   3.4   3.3  Average Risk       5.0   4.4  2 X Average Risk   9.6   7.1  3 X Average Risk  23.4   11.0        Use the calculated Patient Ratio above and the CHD Risk Table to determine the patient's CHD Risk.        ATP III CLASSIFICATION (LDL):  <100     mg/dL   Optimal  100-129  mg/dL   Near or Above                    Optimal  130-159  mg/dL   Borderline  160-189  mg/dL   High  >190     mg/dL   Very High Performed at [facility], [address] [place], [place] [address]   CBC     Status: None   Collection Time: 01/09/23  8:20 AM  Result Value Ref Range   WBC 7.7 4.0 - 10.5 K/uL   RBC 4.60 3.87 - 5.11 MIL/uL   Hemoglobin 12.5 12.0 - 15.0 g/dL   HCT 39.9 36.0 - 46.0 %   MCV 86.7 80.0 - 100.0 fL   MCH 27.2 26.0 - 34.0 pg   MCHC 31.3 30.0 - 36.0 g/dL   RDW 14.9 11.5 - 15.5 %   Platelets 195 150 - 400 K/uL   nRBC 0.0 0.0 - 0.2 %    Comment: Performed at [facility], [address] [place],  [address]  Comprehensive metabolic panel     Status: Abnormal   Collection Time: 01/09/23  8:20 AM  Result Value Ref Range   Sodium 139 135 - 145 mmol/L   Potassium 3.5 3.5 - 5.1 mmol/L   Chloride 103 98 - 111 mmol/L   CO2 28 22 - 32 mmol/L   Glucose, Bld 92 70 - 99 mg/dL    Comment: Glucose reference range applies only to samples taken after fasting for at least 8 hours.   BUN 10 8 - 23 mg/dL   Creatinine, Ser 0.78 0.44 - 1.00 mg/dL   Calcium 8.8 (L) 8.9 - 10.3 mg/dL   Total Protein 6.9 6.5 - 8.1  g/dL   Albumin 3.3 (L) 3.5 - 5.0 g/dL   AST 19 15 - 41 U/L   ALT 9 0 - 44 U/L   Alkaline Phosphatase 77 38 - 126 U/L   Total Bilirubin 0.9 0.3 - 1.2 mg/dL   GFR, Estimated >60 >60 mL/min    Comment: (NOTE) Calculated using the CKD-EPI Creatinine Equation (2021)    Anion gap 8 5 - 15    Comment: Performed at [facility], [address] [place], [place] [address]  Glucose, capillary     Status: None   Collection Time: 01/09/23  9:08 AM  Result Value Ref Range   Glucose-Capillary 79 70 - 99 mg/dL    Comment: Glucose reference range applies only to samples taken after fasting for at least 8 hours.  Urinalysis, w/ Reflex to Culture (Infection Suspected) -Urine, Clean Catch     Status: Abnormal   Collection Time: 01/09/23  9:22 AM  Result Value Ref Range   Specimen Source URINE, CLEAN CATCH    Color, Urine STRAW (A) YELLOW   APPearance CLEAR (A) CLEAR   Specific Gravity, Urine 1.015 1.005 - 1.030   pH 8.0 5.0 - 8.0   Glucose, UA NEGATIVE NEGATIVE mg/dL   Hgb urine dipstick NEGATIVE NEGATIVE   Bilirubin Urine NEGATIVE NEGATIVE   Ketones, ur NEGATIVE NEGATIVE mg/dL   Protein, ur NEGATIVE NEGATIVE mg/dL   Nitrite NEGATIVE NEGATIVE   Leukocytes,Ua NEGATIVE NEGATIVE   WBC, UA 0-5 0 - 5 WBC/hpf    Comment:        Reflex urine culture not performed if WBC <=10, OR if Squamous epithelial cells >5. If Squamous epithelial cells >5 suggest recollection.    Bacteria, UA NONE SEEN NONE SEEN   Squamous Epithelial / HPF 0-5 0 - 5 /HPF    Comment: Performed at [facility], [address] [place], [place] [address]    No results found for this or any previous visit (from the past 240 hour(s)).  Lipid Panel Recent Labs    01/09/23 0820  CHOL 237*  TRIG 106  HDL 59  CHOLHDL 4.0  VLDL 21  LDLCALC 157*    Studies/Results: ECHOCARDIOGRAM COMPLETE  Result Date: 01/09/2023    ECHOCARDIOGRAM REPORT   Patient Name:   BRAYLIN BERGMAN    Date of Exam: 01/08/2023 Medical  Rec #:  [id number]     Height:       71.0 in Accession #:    [id number]    Weight:       167.0 lb Date of Birth:  02-Nov-1927     BSA:          1.953 m Patient Age:    63 years      BP:           192/95 mmHg Patient Gender: F             HR:           60 bpm. Exam Location:  [place] Procedure: 2D Echo, Cardiac Doppler and Color Doppler Indications:     G45.9 TIA  History:         Patient has no prior history of Echocardiogram examinations.                  Risk Factors:Hypertension. Hypothyroidism.  Sonographer:     Cresenciano Lick RDCS Referring Phys:  [place] Diagnosing Phys: Nelva Bush MD IMPRESSIONS  1. Left ventricular ejection fraction, by estimation, is >55%. The left ventricle has normal function. Left ventricular endocardial border not optimally defined to evaluate regional wall motion. There is mild left ventricular hypertrophy. Left ventricular diastolic parameters are consistent with Grade I diastolic dysfunction (impaired relaxation).  2. Right ventricular systolic function is normal. The right ventricular size is normal. There is normal pulmonary artery systolic pressure.  3. The mitral valve is normal in structure. Trivial mitral valve regurgitation. No evidence of mitral stenosis.  4. The aortic valve is  tricuspid. There is mild thickening of the aortic valve. Aortic valve regurgitation is mild. Aortic valve sclerosis is present, with no evidence of aortic valve stenosis.  5. There is mild dilatation of the ascending aorta, measuring 37 mm.  6. The inferior vena cava is normal in size with greater than 50% respiratory variability, suggesting right atrial pressure of 3 mmHg. FINDINGS  Left Ventricle: Left ventricular ejection fraction, by estimation, is >55%. The left ventricle has normal function. Left ventricular endocardial border not optimally defined to evaluate regional wall motion. The left ventricular internal cavity size was  normal in size. There is mild left  ventricular hypertrophy. Left ventricular diastolic parameters are consistent with Grade I diastolic dysfunction (impaired relaxation). Right Ventricle: The right ventricular size is normal. No increase in right ventricular wall thickness. Right ventricular systolic function is normal. There is normal pulmonary artery systolic pressure. The tricuspid regurgitant velocity is 1.90 m/s, and  with an assumed right atrial pressure of 3 mmHg, the estimated right ventricular systolic pressure is 0000000 mmHg. Left Atrium: Left atrial size was normal in size. Right Atrium: Right atrial size was normal in size. Pericardium: There is no evidence of pericardial effusion. Mitral Valve: The mitral valve is normal in structure. Trivial mitral valve regurgitation. No evidence of mitral valve stenosis. Tricuspid Valve: The tricuspid valve is grossly normal. Tricuspid valve regurgitation is mild. Aortic Valve: The aortic valve is tricuspid. There is mild thickening of the aortic valve. Aortic valve regurgitation is mild. Aortic valve sclerosis is present, with no evidence of aortic valve stenosis. Pulmonic Valve: The pulmonic valve was not well visualized. Pulmonic valve regurgitation is not visualized. No evidence of pulmonic stenosis. Aorta: The aortic root is normal in size and structure. There is mild dilatation of the ascending aorta, measuring 37 mm. Pulmonary Artery: The pulmonary artery is of normal size. Venous: The inferior vena cava is normal in size with greater than 50% respiratory variability, suggesting right atrial pressure of 3 mmHg. IAS/Shunts: No atrial level shunt detected by color flow Doppler.  LEFT VENTRICLE PLAX 2D LVIDd:         3.70 cm   Diastology LVIDs:         2.00 cm   LV e' medial:    4.90 cm/s LV PW:         1.10 cm   LV E/e' medial:  11.0 LV IVS:        1.10 cm   LV e' lateral:   5.33 cm/s LVOT diam:     1.90 cm   LV E/e' lateral: 10.1 LV SV:         56 LV SV Index:   29 LVOT Area:     2.84 cm  RIGHT  VENTRICLE             IVC RV Basal diam:  3.70 cm     IVC diam: 1.30 cm RV S prime:     17.05 cm/s TAPSE (M-mode): 2.4 cm LEFT ATRIUM             Index        RIGHT ATRIUM           Index LA diam:        3.70 cm 1.89 cm/m   RA Area:     12.00 cm LA Vol (A2C):   47.3 ml 24.22 ml/m  RA Volume:   32.50 ml  16.64 ml/m LA Vol (A4C):   26.2 ml 13.42 ml/m LA Biplane  Vol: 35.3 ml 18.07 ml/m  AORTIC VALVE LVOT Vmax:   84.10 cm/s LVOT Vmean:  55.050 cm/s LVOT VTI:    0.198 m  AORTA Ao Root diam: 3.60 cm Ao Asc diam:  3.70 cm MITRAL VALVE               TRICUSPID VALVE MV Area (PHT): 2.29 cm    TR Peak grad:   14.4 mmHg MV Decel Time: 331 msec    TR Vmax:        190.00 cm/s MV E velocity: 53.75 cm/s MV A velocity: 94.60 cm/s  SHUNTS MV E/A ratio:  0.57        Systemic VTI:  0.20 m                            Systemic Diam: 1.90 cm Nelva Bush MD Electronically signed by Nelva Bush MD Signature Date/Time: 01/09/2023/6:52:46 AM    Final    MR BRAIN WO CONTRAST  Result Date: 01/08/2023 CLINICAL DATA:  Initial evaluation for neuro deficit, stroke suspected. EXAM: MRI HEAD WITHOUT CONTRAST TECHNIQUE: Multiplanar, multiecho pulse sequences of the brain and surrounding structures were obtained without intravenous contrast. COMPARISON:  Prior CTs from earlier the same day. FINDINGS: Brain: Examination degraded by motion artifact. Generalized age-related cerebral atrophy. Extensive patchy and confluent T2/FLAIR hyperintensity involving the periventricular and deep white matter both cerebral hemispheres as well as the pons, consistent with chronic small vessel ischemic disease, severe in nature. Few tiny remote cerebellar infarcts noted. 1.2 cm focus of restricted diffusion involving the posterior limb of the left internal capsule, consistent with a acute ischemic infarct (series 5, image 24). No associated hemorrhage or mass effect. No other evidence for acute or subacute ischemia. Gray-white matter differentiation  otherwise maintained. No acute intracranial hemorrhage. Few small chronic micro hemorrhages noted, likely small vessel related. No mass lesion, midline shift or mass effect. No hydrocephalus or extra-axial fluid collection. Pituitary gland and suprasellar region within normal limits. Vascular: Major intracranial vascular flow voids are maintained. Skull and upper cervical spine: Craniocervical junction within normal limits. Bone marrow signal intensity normal. No scalp soft tissue abnormality. Sinuses/Orbits: Prior bilateral ocular lens replacement. Paranasal sinuses are largely clear. Small bilateral mastoid effusions, of doubtful significance. Visualized nasopharynx unremarkable. Other: None. IMPRESSION: 1. 1.2 cm acute ischemic nonhemorrhagic infarct involving the posterior limb of the left internal capsule. 2. Underlying age-related cerebral atrophy with severe chronic microvascular ischemic disease. Electronically Signed   By: Jeannine Boga M.D.   On: 01/08/2023 23:57   CT ANGIO HEAD NECK W WO CM  Result Date: 01/08/2023 CLINICAL DATA:  Initial evaluation for neuro deficit, stroke suspected. EXAM: CT ANGIOGRAPHY HEAD AND NECK TECHNIQUE: Multidetector CT imaging of the head and neck was performed using the standard protocol during bolus administration of intravenous contrast. Multiplanar CT image reconstructions and MIPs were obtained to evaluate the vascular anatomy. Carotid stenosis measurements (when applicable) are obtained utilizing NASCET criteria, using the distal internal carotid diameter as the denominator. RADIATION DOSE REDUCTION: This exam was performed according to the departmental dose-optimization program which includes automated exposure control, adjustment of the mA and/or kV according to patient size and/or use of iterative reconstruction technique. CONTRAST:  75 mL of Omnipaque 350. COMPARISON:  Prior CT from earlier the same day. FINDINGS: CTA NECK FINDINGS Aortic arch: Visualized  aortic arch normal in caliber with standard 3 vessel morphology. Mild atheromatous change about the arch itself. No stenosis  about the origin the great vessels. Right carotid system: Right common and internal carotid arteries are patent without dissection. Minimal atheromatous change about the right carotid bulb without hemodynamically significant stenosis. Left carotid system: Left common and internal carotid arteries are patent without dissection. Minimal atheromatous change about the left carotid bulb without hemodynamically significant stenosis. Vertebral arteries: Both vertebral arteries arise from the subclavian arteries. No proximal subclavian artery stenosis. Both vertebral arteries widely patent without stenosis, dissection or occlusion. Skeleton: No discrete or worrisome osseous lesions. Mild spondylosis at C4-5 through C6-7. Other neck: No other acute soft tissue abnormality within the neck. Upper chest: Few small pulmonary nodules measuring up to 5 mm are seen clustered within the peripheral left upper lobe (series 4, image 172), indeterminate. Visualized upper chest demonstrates no other acute finding. Review of the MIP images confirms the above findings CTA HEAD FINDINGS Anterior circulation: Mild atheromatous change within the carotid siphons without stenosis. 2 mm outpouching extending inferiorly from the supraclinoid left ICA felt to be most consistent with a small vascular infundibulum. Left A1 segment widely patent. Right A1 hypoplastic and/or absent. Normal anterior communicating artery complex. Atheromatous irregularity throughout the anterior cerebral arteries without proximal high-grade stenosis. Atheromatous change seen within both M1 segments as well with no more than mild multifocal narrowing. No proximal MCA branch occlusion or high-grade stenosis. Distal MCA branches well perfused, although demonstrates small vessel atheromatous irregularity. Posterior circulation: Both V4 segments patent  without stenosis. Left PICA patent. Right PICA origin not well seen. Basilar mildly irregular but patent without high-grade stenosis. Superior cerebellar arteries patent bilaterally. Left PCA supplied via the basilar. Fetal type origin of the right PCA. Both PCAs patent to their distal aspects without significant stenosis. Venous sinuses: Grossly patent allowing for timing the contrast bolus. Anatomic variants: As above.  No aneurysm. Review of the MIP images confirms the above findings IMPRESSION: 1. Negative CTA for large vessel occlusion or other emergent finding. 2. Moderate intracranial atherosclerotic disease without proximal high-grade or correctable stenosis. 3. Mild atheromatous change about the carotid bifurcations without stenosis. No hemodynamically significant stenosis within the neck. 4. Few small pulmonary nodules measuring up to 5 mm clustered within the peripheral left upper lobe, indeterminate. Per Fleischner Society Guidelines, if patient is low risk for malignancy, no routine follow-up imaging is recommended. If patient is high risk for malignancy, a non-contrast Chest CT at 12 months is optional. If performed and the nodule is stable at 12 months, no further follow-up is recommended. These guidelines do not apply to immunocompromised patients and patients with cancer. Follow up in patients with significant comorbidities as clinically warranted. For lung cancer screening, adhere to Lung-RADS guidelines. Reference: Radiology. 2017; 284(1):228-43. Electronically Signed   By: Jeannine Boga M.D.   On: 01/08/2023 20:12   CT HEAD CODE STROKE WO CONTRAST  Result Date: 01/08/2023 CLINICAL DATA:  Code stroke. Neuro deficit, acute, stroke suspected. Acute onset slurred speech. History of dementia. History of prior stroke with foot drop. EXAM: CT HEAD WITHOUT CONTRAST TECHNIQUE: Contiguous axial images were obtained from the base of the skull through the vertex without intravenous contrast.  RADIATION DOSE REDUCTION: This exam was performed according to the departmental dose-optimization program which includes automated exposure control, adjustment of the mA and/or kV according to patient size and/or use of iterative reconstruction technique. COMPARISON:  Head CT 04/12/2022. FINDINGS: Brain: Mild generalized parenchymal atrophy. Patchy and ill-defined hypoattenuation within the cerebral white matter, nonspecific but compatible with advanced chronic small vessel ischemic disease.  Chronic small vessel ischemic changes are also present within the bilateral deep gray nuclei. There is no acute intracranial hemorrhage. No demarcated cortical infarct. No extra-axial fluid collection. No evidence of an intracranial mass. No midline shift. Vascular: No hyperdense vessel.  Atherosclerotic calcifications. Skull: No fracture or aggressive osseous lesion. Sinuses/Orbits: No mass or acute finding within the imaged orbits. No significant paranasal sinus disease at the imaged levels. ASPECTS [facility] Stroke Program Early CT Score) - Ganglionic level infarction (caudate, lentiform nuclei, internal capsule, insula, M1-M3 cortex): 7 - Supraganglionic infarction (M4-M6 cortex): 3 Total score (0-10 with 10 being normal): 10 No evidence of an acute intracranial abnormality. These results were communicated to Dr. Cheral Marker at 11:32 amon 3/19/2024by text page via the [facility] messaging system. IMPRESSION: 1.  No evidence of an acute intracranial abnormality. 2. Parenchymal atrophy and chronic small vessel disease, as described. Electronically Signed   By: Kellie Simmering D.O.   On: 01/08/2023 11:32    Medications: Scheduled:  amLODipine  10 mg Oral Daily   aspirin EC  325 mg Oral Once   aspirin EC  81 mg Oral Daily   clopidogrel  75 mg Oral Daily   divalproex  125 mg Oral Daily   levothyroxine  88 mcg Oral QAC breakfast   memantine  5 mg Oral Daily   rivastigmine  3 mg Oral BID    Assessment: 89 year old female presenting  with acute onset of slurred speech and left hand weakness followed by rapid resolution. LKN 0830.  - Exam today reveals right facial droop, severe dysarthria, mild agitation, disorientation and LUE weakness.   - CT head:  No evidence of an acute intracranial abnormality. Parenchymal atrophy and extensive chronic small vessel disease is noted.  - CTA of head and neck is negative for LVO. There is moderate intracranial atherosclerotic disease without proximal high-grade or correctable stenosis. Mild atheromatous change about the carotid bifurcations without stenosis. No hemodynamically significant stenosis within the neck. - MRI brain: 1.2 cm acute ischemic nonhemorrhagic infarct involving the posterior limb of the left internal capsule. Underlying age-related cerebral atrophy with severe chronic microvascular ischemic disease. - EKG: Sinus rhythm; Borderline prolonged QT interval - TTE: LVEF > 55%. The left ventricle has normal function. No mural thrombus or valvular vegetation mentioned in the report.    Recommendations: - Decrease BP by 20% per day to final goal of A999333 systolic.  - [id number], fasting lipid panel - PT consult, OT consult, Speech consult - Continue ASA and Plavix. She has failed ASA monotherapy.  - Risk factor modification to include daily light exercise such as walking, as tolerated - Frequent neuro checks - NPO until passes stroke swallow screen     LOS: 0 days   @Electronically  signed: Dr. Kerney Elbe 01/09/2023  4:52 PM

## 2023-01-09 NOTE — Progress Notes (Signed)
PROGRESS NOTE    KANIAH PIERCY   [id number] DOB: 24-Dec-1927  DOA: 01/08/2023 Date of Service: 01/09/23 PCP: Rusty Aus, MD     Brief Narrative / Hospital Course:  YVETTE SILCOTT is a 89 y.o. female with medical history significant of hypertension, hypothyroidism, dementia presenting with CVA.  Patient noted with slurred speech at facility around 930 this morning.  This is well off baseline per report.  Per report, patient was eating breakfast like normal prior to this.  Family was called to which patient's daughter went to evaluate the patient with patient noted slurred speech.  Patient was then taken to the ER by car with improvement in symptoms upon arrival.  03/19: Code stroke was called upon arrival.  Initial imaging grossly negative.  Of note patient also had some mild confusion within the past month including urine and labs that were done at primary care's office.  No reports of fevers or chills.  Reports no nausea or vomiting.  Reports remote history of CVA diagnosed years ago as an incidental finding. Presented to the ER afebrile, systolic pressures in the 180s to 190s, satting well on room air.  White count 6.6, hemoglobin 12.3, potassium 3.3, creatinine 0.8.  Initial code stroke CT head grossly stable.  Upon evaluation by admitting hospitalist, patient had new onset left-sided facial droop to which a repeat code stroke was called with neurology reevaluation. formal CVA evaluation including MRI of the brain, CTA of the head neck, TTE, risk stratification labs. On baby aspirin chronically. S/p plavix load in ER. PT agitation precluded timely CTA evaluation.  03/20: CTA H/N neg LVO, moderate atherosclerotic disease. MRI brain 1.2 cm acute ischemic nonhemorrhagic infarct involving the posterior limb of the left internal capsule. Underlying age-related cerebral atrophy with severe chronic microvascular ischemic disease. PT/OT eval pending. Long discussion today w/ family at bedside re: poor  prognosis if patient is not recovering her alertness / po intake. I am concerned for vascular dementia as cause of sudden change, and if this is the case I feel she is unlikely to recover. Will request additional input from neurology team if needed re: prognostication. Discussed code status with daughter - given how patient is looking now, I hesitate to offer feeding tube of any kind and I hesitate to offer CPR/intubation as I do not feel she would recover from resuscitation measures. Will leave as FULL CODE for now but daughter would like to speak w/ family about this further and we will revisit the topic tomorrow.   Consultants:  Neurology   Procedures: None       ASSESSMENT & PLAN:   Principal Problem:   CVA (cerebral vascular accident) ([place]) Active Problems:   Hypothyroidism   HYPERCHOLESTEROLEMIA, PURE   Essential hypertension   GERD   Dementia ([place])  CVA (cerebral vascular accident) DAPT: ASA + Plavix  Permissive hypertension window has passed Neurology consult:    Dementia Cont namenda, exelon   GERD PPI   Essential hypertension decrease BP by 20% per day to final goal of A999333 systolic labetalol    HLD Check lipid panel  Start statin    Hypothyroidism Cont synthroid      DVT prophylaxis: SCD Pertinent IV fluids/nutrition: npo pending more alert  Central lines / invasive devices: none   Code Status: FULL CODE   Current Admission Status: inpatient   TOC needs / Dispo plan: pend PT/OT eval but may need long term care  Barriers to discharge / significant pending items:  clinical improvement vs consideration for transition to hospice / comfort measures if not improving. Strong consideration for palliative care involvement tomorrow pending further d/w family              Subjective / Brief ROS:  Patient alert, aphasic   Family Communication: see above - spoke w/ daughter and caretaker at length     Objective Findings:  Vitals:    01/09/23 0542 01/09/23 0733 01/09/23 0942 01/09/23 1602  BP: (!) 176/94 (!) 193/95 (!) 156/111 (!) 178/89  Pulse: 62 62  81  Resp: 20 16  18   Temp: 98.7 F (37.1 C) 98 F (36.7 C)  98.3 F (36.8 C)  TempSrc:  Oral  Oral  SpO2: 99% 96%  94%  Weight:      Height:        Intake/Output Summary (Last 24 hours) at 01/09/2023 1633 Last data filed at 01/09/2023 1300 Gross per 24 hour  Intake 0 ml  Output 850 ml  Net -850 ml   Filed Weights   01/08/23 1146  Weight: 75.8 kg    Examination:  Physical Exam Constitutional:      General: She is not in acute distress.    Appearance: She is ill-appearing.  HENT:     Mouth/Throat:     Mouth: Mucous membranes are dry.  Cardiovascular:     Rate and Rhythm: Normal rate and regular rhythm.  Pulmonary:     Effort: Pulmonary effort is normal.     Breath sounds: Normal breath sounds.  Abdominal:     General: Abdomen is flat. Bowel sounds are normal.     Palpations: Abdomen is soft.  Musculoskeletal:     Right lower leg: No edema.     Left lower leg: No edema.  Skin:    General: Skin is warm and dry.  Neurological:     Mental Status: She is alert.     Motor: Weakness present.     Comments: She can follow basic commands but is very weak in all extremities but especially on L. Aphasic - very slurred speech is unintelligible           Scheduled Medications:   amLODipine  10 mg Oral Daily   aspirin EC  325 mg Oral Once   aspirin EC  81 mg Oral Daily   clopidogrel  75 mg Oral Daily   divalproex  125 mg Oral Daily   levothyroxine  88 mcg Oral QAC breakfast   memantine  5 mg Oral Daily   rivastigmine  3 mg Oral BID    Continuous Infusions:   PRN Medications:  diphenhydrAMINE, labetalol, LORazepam  Antimicrobials from admission:  Anti-infectives (From admission, onward)    None           Data Reviewed:  I have personally reviewed the following...  CBC: Recent Labs  Lab 01/08/23 1112 01/09/23 0820  WBC 6.6  7.7  NEUTROABS 4.2  --   HGB 12.3 12.5  HCT 39.6 39.9  MCV 88.2 86.7  PLT 227 0000000   Basic Metabolic Panel: Recent Labs  Lab 01/08/23 1112 01/09/23 0820  NA 141 139  K 3.3* 3.5  CL 102 103  CO2 30 28  GLUCOSE 81 92  BUN 13 10  CREATININE 0.78 0.78  CALCIUM 8.9 8.8*   GFR: Estimated Creatinine Clearance: 50.2 mL/min (by C-G formula based on SCr of 0.78 mg/dL). Liver Function Tests: Recent Labs  Lab 01/08/23 1112 01/09/23 0820  AST 15 19  ALT 10 9  ALKPHOS 82 77  BILITOT 0.5 0.9  PROT 6.9 6.9  ALBUMIN 3.2* 3.3*   No results for input(s): "LIPASE", "AMYLASE" in the last 168 hours. No results for input(s): "AMMONIA" in the last 168 hours. Coagulation Profile: Recent Labs  Lab 01/08/23 1112  INR 1.0   Cardiac Enzymes: No results for input(s): "CKTOTAL", "CKMB", "CKMBINDEX", "TROPONINI" in the last 168 hours. BNP (last 3 results) No results for input(s): "PROBNP" in the last 8760 hours. HbA1C: Recent Labs    01/08/23 1112  HGBA1C 5.7*   CBG: Recent Labs  Lab 01/08/23 1109 01/08/23 2033 01/09/23 0732 01/09/23 0908  GLUCAP 81 94 80 79   Lipid Profile: Recent Labs    01/09/23 0820  CHOL 237*  HDL 59  LDLCALC 157*  TRIG 106  CHOLHDL 4.0   Thyroid Function Tests: No results for input(s): "TSH", "T4TOTAL", "FREET4", "T3FREE", "THYROIDAB" in the last 72 hours. Anemia Panel: No results for input(s): "VITAMINB12", "FOLATE", "FERRITIN", "TIBC", "IRON", "RETICCTPCT" in the last 72 hours. Most Recent Urinalysis On File:     Component Value Date/Time   COLORURINE STRAW (A) 01/09/2023 0922   APPEARANCEUR CLEAR (A) 01/09/2023 0922   APPEARANCEUR Clear 05/04/2012 0613   LABSPEC 1.015 01/09/2023 0922   LABSPEC 1.014 05/04/2012 0613   PHURINE 8.0 01/09/2023 0922   GLUCOSEU NEGATIVE 01/09/2023 0922   GLUCOSEU Negative 05/04/2012 0613   HGBUR NEGATIVE 01/09/2023 0922   BILIRUBINUR NEGATIVE 01/09/2023 0922   BILIRUBINUR Negative 05/04/2012 0613    KETONESUR NEGATIVE 01/09/2023 0922   PROTEINUR NEGATIVE 01/09/2023 0922   NITRITE NEGATIVE 01/09/2023 0922   LEUKOCYTESUR NEGATIVE 01/09/2023 0922   LEUKOCYTESUR Negative 05/04/2012 0613   Sepsis Labs: @LABRCNTIP (procalcitonin:4,lacticidven:4) Microbiology: No results found for this or any previous visit (from the past 240 hour(s)).    Radiology Studies last 3 days: ECHOCARDIOGRAM COMPLETE  Result Date: 01/09/2023    ECHOCARDIOGRAM REPORT   Patient Name:   ALYNNE BRADSHAW    Date of Exam: 01/08/2023 Medical Rec #:  [id number]     Height:       71.0 in Accession #:    [id number]    Weight:       167.0 lb Date of Birth:  02-03-1928     BSA:          1.953 m Patient Age:    31 years      BP:           192/95 mmHg Patient Gender: F             HR:           60 bpm. Exam Location:  [address] Procedure: 2D Echo, Cardiac Doppler and Color Doppler Indications:     G45.9 TIA  History:         Patient has no prior history of Echocardiogram examinations.                  Risk Factors:Hypertension. Hypothyroidism.  Sonographer:     Cresenciano Lick RDCS Referring Phys:  [place] Diagnosing Phys: Nelva Bush MD IMPRESSIONS  1. Left ventricular ejection fraction, by estimation, is >55%. The left ventricle has normal function. Left ventricular endocardial border not optimally defined to evaluate regional wall motion. There is mild left ventricular hypertrophy. Left ventricular diastolic parameters are consistent with Grade I diastolic dysfunction (impaired relaxation).  2. Right ventricular systolic function is normal. The right ventricular size is normal. There is normal pulmonary artery  systolic pressure.  3. The mitral valve is normal in structure. Trivial mitral valve regurgitation. No evidence of mitral stenosis.  4. The aortic valve is tricuspid. There is mild thickening of the aortic valve. Aortic valve regurgitation is mild. Aortic valve sclerosis is present, with no evidence of aortic  valve stenosis.  5. There is mild dilatation of the ascending aorta, measuring 37 mm.  6. The inferior vena cava is normal in size with greater than 50% respiratory variability, suggesting right atrial pressure of 3 mmHg. FINDINGS  Left Ventricle: Left ventricular ejection fraction, by estimation, is >55%. The left ventricle has normal function. Left ventricular endocardial border not optimally defined to evaluate regional wall motion. The left ventricular internal cavity size was  normal in size. There is mild left ventricular hypertrophy. Left ventricular diastolic parameters are consistent with Grade I diastolic dysfunction (impaired relaxation). Right Ventricle: The right ventricular size is normal. No increase in right ventricular wall thickness. Right ventricular systolic function is normal. There is normal pulmonary artery systolic pressure. The tricuspid regurgitant velocity is 1.90 m/s, and  with an assumed right atrial pressure of 3 mmHg, the estimated right ventricular systolic pressure is 0000000 mmHg. Left Atrium: Left atrial size was normal in size. Right Atrium: Right atrial size was normal in size. Pericardium: There is no evidence of pericardial effusion. Mitral Valve: The mitral valve is normal in structure. Trivial mitral valve regurgitation. No evidence of mitral valve stenosis. Tricuspid Valve: The tricuspid valve is grossly normal. Tricuspid valve regurgitation is mild. Aortic Valve: The aortic valve is tricuspid. There is mild thickening of the aortic valve. Aortic valve regurgitation is mild. Aortic valve sclerosis is present, with no evidence of aortic valve stenosis. Pulmonic Valve: The pulmonic valve was not well visualized. Pulmonic valve regurgitation is not visualized. No evidence of pulmonic stenosis. Aorta: The aortic root is normal in size and structure. There is mild dilatation of the ascending aorta, measuring 37 mm. Pulmonary Artery: The pulmonary artery is of normal size. Venous:  The inferior vena cava is normal in size with greater than 50% respiratory variability, suggesting right atrial pressure of 3 mmHg. IAS/Shunts: No atrial level shunt detected by color flow Doppler.  LEFT VENTRICLE PLAX 2D LVIDd:         3.70 cm   Diastology LVIDs:         2.00 cm   LV e' medial:    4.90 cm/s LV PW:         1.10 cm   LV E/e' medial:  11.0 LV IVS:        1.10 cm   LV e' lateral:   5.33 cm/s LVOT diam:     1.90 cm   LV E/e' lateral: 10.1 LV SV:         56 LV SV Index:   29 LVOT Area:     2.84 cm  RIGHT VENTRICLE             IVC RV Basal diam:  3.70 cm     IVC diam: 1.30 cm RV S prime:     17.05 cm/s TAPSE (M-mode): 2.4 cm LEFT ATRIUM             Index        RIGHT ATRIUM           Index LA diam:        3.70 cm 1.89 cm/m   RA Area:     12.00 cm LA Vol (A2C):  47.3 ml 24.22 ml/m  RA Volume:   32.50 ml  16.64 ml/m LA Vol (A4C):   26.2 ml 13.42 ml/m LA Biplane Vol: 35.3 ml 18.07 ml/m  AORTIC VALVE LVOT Vmax:   84.10 cm/s LVOT Vmean:  55.050 cm/s LVOT VTI:    0.198 m  AORTA Ao Root diam: 3.60 cm Ao Asc diam:  3.70 cm MITRAL VALVE               TRICUSPID VALVE MV Area (PHT): 2.29 cm    TR Peak grad:   14.4 mmHg MV Decel Time: 331 msec    TR Vmax:        190.00 cm/s MV E velocity: 53.75 cm/s MV A velocity: 94.60 cm/s  SHUNTS MV E/A ratio:  0.57        Systemic VTI:  0.20 m                            Systemic Diam: 1.90 cm Nelva Bush MD Electronically signed by Nelva Bush MD Signature Date/Time: 01/09/2023/6:52:46 AM    Final    MR BRAIN WO CONTRAST  Result Date: 01/08/2023 CLINICAL DATA:  Initial evaluation for neuro deficit, stroke suspected. EXAM: MRI HEAD WITHOUT CONTRAST TECHNIQUE: Multiplanar, multiecho pulse sequences of the brain and surrounding structures were obtained without intravenous contrast. COMPARISON:  Prior CTs from earlier the same day. FINDINGS: Brain: Examination degraded by motion artifact. Generalized age-related cerebral atrophy. Extensive patchy and confluent  T2/FLAIR hyperintensity involving the periventricular and deep white matter both cerebral hemispheres as well as the pons, consistent with chronic small vessel ischemic disease, severe in nature. Few tiny remote cerebellar infarcts noted. 1.2 cm focus of restricted diffusion involving the posterior limb of the left internal capsule, consistent with a acute ischemic infarct (series 5, image 24). No associated hemorrhage or mass effect. No other evidence for acute or subacute ischemia. Gray-white matter differentiation otherwise maintained. No acute intracranial hemorrhage. Few small chronic micro hemorrhages noted, likely small vessel related. No mass lesion, midline shift or mass effect. No hydrocephalus or extra-axial fluid collection. Pituitary gland and suprasellar region within normal limits. Vascular: Major intracranial vascular flow voids are maintained. Skull and upper cervical spine: Craniocervical junction within normal limits. Bone marrow signal intensity normal. No scalp soft tissue abnormality. Sinuses/Orbits: Prior bilateral ocular lens replacement. Paranasal sinuses are largely clear. Small bilateral mastoid effusions, of doubtful significance. Visualized nasopharynx unremarkable. Other: None. IMPRESSION: 1. 1.2 cm acute ischemic nonhemorrhagic infarct involving the posterior limb of the left internal capsule. 2. Underlying age-related cerebral atrophy with severe chronic microvascular ischemic disease. Electronically Signed   By: Jeannine Boga M.D.   On: 01/08/2023 23:57   CT ANGIO HEAD NECK W WO CM  Result Date: 01/08/2023 CLINICAL DATA:  Initial evaluation for neuro deficit, stroke suspected. EXAM: CT ANGIOGRAPHY HEAD AND NECK TECHNIQUE: Multidetector CT imaging of the head and neck was performed using the standard protocol during bolus administration of intravenous contrast. Multiplanar CT image reconstructions and MIPs were obtained to evaluate the vascular anatomy. Carotid stenosis  measurements (when applicable) are obtained utilizing NASCET criteria, using the distal internal carotid diameter as the denominator. RADIATION DOSE REDUCTION: This exam was performed according to the departmental dose-optimization program which includes automated exposure control, adjustment of the mA and/or kV according to patient size and/or use of iterative reconstruction technique. CONTRAST:  75 mL of Omnipaque 350. COMPARISON:  Prior CT from earlier the same day. FINDINGS:  CTA NECK FINDINGS Aortic arch: Visualized aortic arch normal in caliber with standard 3 vessel morphology. Mild atheromatous change about the arch itself. No stenosis about the origin the great vessels. Right carotid system: Right common and internal carotid arteries are patent without dissection. Minimal atheromatous change about the right carotid bulb without hemodynamically significant stenosis. Left carotid system: Left common and internal carotid arteries are patent without dissection. Minimal atheromatous change about the left carotid bulb without hemodynamically significant stenosis. Vertebral arteries: Both vertebral arteries arise from the subclavian arteries. No proximal subclavian artery stenosis. Both vertebral arteries widely patent without stenosis, dissection or occlusion. Skeleton: No discrete or worrisome osseous lesions. Mild spondylosis at C4-5 through C6-7. Other neck: No other acute soft tissue abnormality within the neck. Upper chest: Few small pulmonary nodules measuring up to 5 mm are seen clustered within the peripheral left upper lobe (series 4, image 172), indeterminate. Visualized upper chest demonstrates no other acute finding. Review of the MIP images confirms the above findings CTA HEAD FINDINGS Anterior circulation: Mild atheromatous change within the carotid siphons without stenosis. 2 mm outpouching extending inferiorly from the supraclinoid left ICA felt to be most consistent with a small vascular  infundibulum. Left A1 segment widely patent. Right A1 hypoplastic and/or absent. Normal anterior communicating artery complex. Atheromatous irregularity throughout the anterior cerebral arteries without proximal high-grade stenosis. Atheromatous change seen within both M1 segments as well with no more than mild multifocal narrowing. No proximal MCA branch occlusion or high-grade stenosis. Distal MCA branches well perfused, although demonstrates small vessel atheromatous irregularity. Posterior circulation: Both V4 segments patent without stenosis. Left PICA patent. Right PICA origin not well seen. Basilar mildly irregular but patent without high-grade stenosis. Superior cerebellar arteries patent bilaterally. Left PCA supplied via the basilar. Fetal type origin of the right PCA. Both PCAs patent to their distal aspects without significant stenosis. Venous sinuses: Grossly patent allowing for timing the contrast bolus. Anatomic variants: As above.  No aneurysm. Review of the MIP images confirms the above findings IMPRESSION: 1. Negative CTA for large vessel occlusion or other emergent finding. 2. Moderate intracranial atherosclerotic disease without proximal high-grade or correctable stenosis. 3. Mild atheromatous change about the carotid bifurcations without stenosis. No hemodynamically significant stenosis within the neck. 4. Few small pulmonary nodules measuring up to 5 mm clustered within the peripheral left upper lobe, indeterminate. Per Fleischner Society Guidelines, if patient is low risk for malignancy, no routine follow-up imaging is recommended. If patient is high risk for malignancy, a non-contrast Chest CT at 12 months is optional. If performed and the nodule is stable at 12 months, no further follow-up is recommended. These guidelines do not apply to immunocompromised patients and patients with cancer. Follow up in patients with significant comorbidities as clinically warranted. For lung cancer  screening, adhere to Lung-RADS guidelines. Reference: Radiology. 2017; 284(1):228-43. Electronically Signed   By: Jeannine Boga M.D.   On: 01/08/2023 20:12   CT HEAD CODE STROKE WO CONTRAST  Result Date: 01/08/2023 CLINICAL DATA:  Code stroke. Neuro deficit, acute, stroke suspected. Acute onset slurred speech. History of dementia. History of prior stroke with foot drop. EXAM: CT HEAD WITHOUT CONTRAST TECHNIQUE: Contiguous axial images were obtained from the base of the skull through the vertex without intravenous contrast. RADIATION DOSE REDUCTION: This exam was performed according to the departmental dose-optimization program which includes automated exposure control, adjustment of the mA and/or kV according to patient size and/or use of iterative reconstruction technique. COMPARISON:  Head CT 04/12/2022.  FINDINGS: Brain: Mild generalized parenchymal atrophy. Patchy and ill-defined hypoattenuation within the cerebral white matter, nonspecific but compatible with advanced chronic small vessel ischemic disease. Chronic small vessel ischemic changes are also present within the bilateral deep gray nuclei. There is no acute intracranial hemorrhage. No demarcated cortical infarct. No extra-axial fluid collection. No evidence of an intracranial mass. No midline shift. Vascular: No hyperdense vessel.  Atherosclerotic calcifications. Skull: No fracture or aggressive osseous lesion. Sinuses/Orbits: No mass or acute finding within the imaged orbits. No significant paranasal sinus disease at the imaged levels. ASPECTS [facility] Stroke Program Early CT Score) - Ganglionic level infarction (caudate, lentiform nuclei, internal capsule, insula, M1-M3 cortex): 7 - Supraganglionic infarction (M4-M6 cortex): 3 Total score (0-10 with 10 being normal): 10 No evidence of an acute intracranial abnormality. These results were communicated to Dr. Cheral Marker at 11:32 amon 3/19/2024by text page via the [facility] messaging system.  IMPRESSION: 1.  No evidence of an acute intracranial abnormality. 2. Parenchymal atrophy and chronic small vessel disease, as described. Electronically Signed   By: Kellie Simmering D.O.   On: 01/08/2023 11:32             LOS: 0 days    Time spent 50 min    Emeterio Reeve, DO [facility] 01/09/2023, 4:33 PM    Dictation software may have been used to generate the above note. Typos may occur and escape review in typed/dictated notes. Please contact Dr Sheppard Coil directly for clarity if needed.  Staff may message me via secure chat in [place]  but this may not receive an immediate response,  please page me for urgent matters!  If 7PM-7AM, please contact night coverage [email or web address]

## 2023-01-09 NOTE — Progress Notes (Signed)
SLP Cancellation Note  Patient Details Name: Bethany Hicks MRN: [id number] DOB: [birth year]   Cancelled treatment:       Reason Eval/Treat Not Completed: Fatigue/lethargy limiting ability to participate.  Attempted dysphagia tx session today. Pt laying in bed for duration of visit w/ eyes closed and minimal responsiveness to tactile/verbal stim from family. Pt intermittently stirred in bed, raising arms and pulling on blanket w/ eyes remained closed. Noted occasional unintelligible, mumbled speech. Noted what appeared to be apnea moments (pauses in breathing). MD made aware.  Spoke w/ family in room regarding pt's current status. Noted pt w/ sedating medication in system, likely impacting her alertness also. Family reported increased difficulty understanding her speech today compared to yesterday. Unsure if this is d/t medication, lethargy, Dementia, or impact from CVA at this time (or a combination of all of the above). ST discussed pt's current status, compensatory strategies to address cognitive decline and potential deficits from stroke w/ family and encourage frequent oral care while NPO. MD updated on recommendation to remain NPO except for necessary meds crushed in applesauce when pt is full alert/awake. NSG to monitor.  ST services will continue to f/u in next 1-2 for appropriateness for po trials and ongoing education. Noted MD placed cognitive care consult.  Randall Hiss Graduate Clinician [place], Speech Pathology   Randall Hiss 01/09/2023, 2:29 PM

## 2023-01-10 ENCOUNTER — Inpatient Hospital Stay: Payer: PPO

## 2023-01-10 DIAGNOSIS — Z515 Encounter for palliative care: Secondary | ICD-10-CM | POA: Diagnosis not present

## 2023-01-10 DIAGNOSIS — K219 Gastro-esophageal reflux disease without esophagitis: Secondary | ICD-10-CM | POA: Diagnosis not present

## 2023-01-10 DIAGNOSIS — R4781 Slurred speech: Secondary | ICD-10-CM

## 2023-01-10 DIAGNOSIS — I639 Cerebral infarction, unspecified: Secondary | ICD-10-CM | POA: Diagnosis not present

## 2023-01-10 DIAGNOSIS — E039 Hypothyroidism, unspecified: Secondary | ICD-10-CM | POA: Diagnosis not present

## 2023-01-10 DIAGNOSIS — F03B11 Unspecified dementia, moderate, with agitation: Secondary | ICD-10-CM | POA: Diagnosis not present

## 2023-01-10 MED ORDER — LOSARTAN POTASSIUM 25 MG PO TABS: 25.0000 mg | ORAL_TABLET | Freq: Two times a day (BID) | ORAL | Status: DC

## 2023-01-10 NOTE — Progress Notes (Addendum)
SLP Cancellation Note  Patient Details Name: NEYLA ARISTA MRN: [id number] DOB: 1928/03/21   Cancelled treatment:       Reason Eval/Treat Not Completed: Patient at procedure or test/unavailable (met w/ family in room briefly; [facility]alla[facility] Care arrived.)  Pt out of room having NGT placement. Met w/ family to discuss pt's status/presentation and continuing doing oral and pharyngeal swallow stimulation using oral swabs. Family reported pt has been less arousable yesterday/today, only opening eyes/mumbling briefly to family when given verbal/tactile stimulation.  Palliative Care arrived for meeting. ST services will f/u when pt can fully alert and maintain awareness to task of po trials/swallowing. Family agreed. Pt is at a HIGH risk for aspiration w/ oral intake at this time. Recommend continue NPO status. All meds via NGT at this time. NSG updated.     Orinda Kenner, MS, CCC-SLP Speech Language Pathologist Rehab Services; [place] [phone] (ascom) Shynia Daleo 01/10/2023, 2:23 PM

## 2023-01-10 NOTE — IPAL (Signed)
  Interdisciplinary Goals of Care Family Meeting   Date carried out: 01/10/2023  Location of the meeting: Bedside  Member's involved: Physician, Bedside Registered Nurse, and Family Member or next of kin  Durable Power of Attorney or acting medical decision maker: daughter    Discussion: We discussed goals of care for [employer] .  Family requests DNR status. We discussed that neurology and I agree for trial of feeding tube placement to allow time to recover from CVA but if not recovering would assume swallowing dysfunction d/t dementia and unlikely to improve and would consider comfort care at that time. Family is open to discuss more w/ palliative care   Code status:   Code Status: DNR   Disposition: Continue current acute care  Time spent for the meeting: 30 min     Emeterio Reeve, DO  01/10/2023, 11:28 AM

## 2023-01-10 NOTE — Progress Notes (Signed)
PT Cancellation Note  Patient Details Name: Bethany Hicks MRN: [id number] DOB: 12/26/1927   Cancelled Treatment:    Reason Eval/Treat Not Completed: Patient's level of consciousness.  Chart reviewed and attempted to see the pt.  Pt currently still lethargic and unable to be aroused.  Will likely try in PM to see if she is more alert/oriented.  Feeding tube is scheduled to be trialed.  Pt will be seen at later date/time as medically appropriate.     Gwenlyn Saran, PT, DPT Physical Therapist - [facility]  01/10/23, 12:23 PM

## 2023-01-10 NOTE — Consult Note (Signed)
Consultation Note Date: 01/10/2023 at 1150  Patient Name: Bethany Hicks  DOB: [birth year]  MRN: [id number]  Age / Sex: 89 y.o., female  PCP: Rusty Aus, MD Referring Physician: Emeterio Reeve, DO  Reason for Consultation: Establishing goals of care  HPI/Patient Profile: 89 y.o. female  with past medical history of mixed Alzheimer's and vascular dementia, major depressive disorder (mild), HLD, B12 deficiency, hypothyroidism, and chronic hyperglycemia admitted on from [facility] 01/08/2023 with slurred speech.   CT of the head revealed no acute intracranial abnormality but parenchymal atrophy and extensive chronic small vessel disease was noted.  CTA of head and neck is negative for LVO but had a moderate intracranial atherosclerotic disease.  MRI of the brain revealed a 1.2 cm acute ischemic nonhemorrhagic infarct involving the posterior limb of the left internal capsule.  And also displayed underlying age-related cerebral atrophy with severe chronic microvascular ischemic disease.  TTE revealed LVEF of greater than 55% with left ventricular function WNL.  Patient is being followed by neurology, PT, OT, and SLP.  3/21- Repeat CT on 3/21 revealed no new infacrts or intracranial hemorrhages. IR attempted to place NG without success.   PMT was consulted to discuss goals of care.  Clinical Assessment and Goals of Care: I have reviewed medical records including EPIC notes, labs and imaging, assessed the patient and then met with patient's daughter [name] and her husband [name] to discuss diagnosis prognosis, [place], EOL wishes, disposition and options.  I introduced Palliative Medicine as specialized medical care for people living with serious illness. It focuses on providing relief from the symptoms and stress of a serious illness. The goal is to improve quality of life for both the patient and the  family.  We discussed a brief life review of the patient.  Patient is a widow and mother of 5 children.  [name] describes her as a very compassionate,, calm, and easygoing woman.  [name] is tearful in describing that her mother is her best friend and always a strong support system in her life.  As far as Media planner, [name] shares that she and her sister Curt Bears are joint HCPOAs for patient but that all siblings support and agree with what ever is best for patient.  As far as functional and nutritional status family endorses patient was ambulatory at [facility].  With some prompting and direction, patient was able to feed herself and participate in completing ADLs with assistance.  We discussed patient's current illness and what it means in the larger context of patient's on-going co-morbidities.  We discussed dementia is a progressive, irreversible, and chronic disease that is oftentimes accelerated with acute events and hospitalizations.  Patient was in IR for NG tube placement during majority of my visit.  While she was out of the room, Francie Massing, and I discussed pros and cons as well as poor prognostic indicators of PEG tube placement.    I attempted to elicit values and goals of care important to the patient.  [name] and [name] shared  they would not want the patient to suffer, they want to give her the best quality of life possible, and do not want to do anything that would cause undue harm or pain.  Advance directives, concepts specific to code status, artificial feeding and hydration, and rehospitalization were considered and discussed.  Family continues to support DNR status.  Education offered regarding concept specific to human mortality and the limitations of medical interventions to prolong life when the body begins to fail to thrive. Natural disease trajectory discussed.  During our discussion, patient returned from IR with no NG as IR was unsuccessful in being able to place it in  either nare.  [name] became tearful and shares this is a sign of how to best to move forward with patient.  WE discussed comfort feeds and that patient's prognosis without nutrition/hydration is likely weeks - no more than a month. Family would like to proceed with hospice evaluation for inpatient placement.  Hospice philosophy discussed in detail.  I shared that TOC would offer choice of hospice agencies but family shares they would like for patient to be evaluated for hospice home in [place].  Symptoms assessed.  Patient unable to respond verbally but has no signs of physical discomfort.  No furrowing of the brow, fidgeting, moaning/groaning, or other nonverbal signs of pain/discomfort noted.  No adjustments to [facility] needed at this time.  Above conveyed to attending, SLP, dayshift RN, and TOC.  [name] has requested no further testing such as CTs or MRIs.  She remains in agreement to continue to monitor the patient's lab work and vital signs.  She is also requesting that she have today to process that her mother is reaching end-of-life and to convey this information to her family.  She is asked that hospice reach out to her tomorrow, 3/22.  Goals are set.  Plan is for hospice evaluation.  TOC following closely to facilitate hospice discussion and disposition. DNR remains. Symptom burden is low and no adjustments needed at this time.   PMT will remain available to patient/family and continue to follow/support. We will monitor patient peripherally. Please re-engage with PMT if goals change, at patient/family's request, or if patient's health deteriorates during hospitalization.     PMT contact info given. The family was encouraged to call with questions or concerns.   Primary Decision Maker HCPOA  Physical Exam Vitals reviewed.  Constitutional:      General: She is not in acute distress.    Appearance: She is ill-appearing.  HENT:     Head: Normocephalic.     Mouth/Throat:     Mouth:  Mucous membranes are dry.  Cardiovascular:     Rate and Rhythm: Normal rate.     Pulses: Normal pulses.  Pulmonary:     Effort: Pulmonary effort is normal.  Abdominal:     Palpations: Abdomen is soft.  Musculoskeletal:     Comments: Unable to [facility], generalized weakness  Skin:    General: Skin is warm and dry.     Coloration: Skin is pale.  Neurological:     Comments: nonverbal     Palliative Assessment/Data: 20%     Thank you for this consult. Palliative medicine will continue to follow and assist holistically.   Time Total: 75 minutes Greater than 50%  of this time was spent counseling and coordinating care related to the above assessment and plan.  Signed by: Jordan Hawks, DNP, FNP-BC Palliative Medicine    Please contact Palliative Medicine Team phone at [phone]  for questions and concerns.  For individual provider: See Shea Evans

## 2023-01-10 NOTE — Progress Notes (Addendum)
PROGRESS NOTE    BAYLEY DEXHEIMER   [id number] DOB: [birth year]  DOA: 01/08/2023 Date of Service: 01/10/23 PCP: Rusty Aus, MD     Brief Narrative / Hospital Course:  BLANCHE KIMMINS is a 89 y.o. female with medical history significant of hypertension, hypothyroidism, dementia presenting with CVA.  Patient noted with slurred speech at facility around 930 this morning.  This is well off baseline per report.  Per report, patient was eating breakfast like normal prior to this.  Family was called to which patient's daughter went to evaluate the patient with patient noted slurred speech.  Patient was then taken to the ER by car with improvement in symptoms upon arrival.  03/19: Code stroke was called upon arrival.  Initial imaging grossly negative.  Of note patient also had some mild confusion within the past month including urine and labs that were done at primary care's office.  No reports of fevers or chills.  Reports no nausea or vomiting.  Reports remote history of CVA diagnosed years ago as an incidental finding. Presented to the ER afebrile, systolic pressures in the 180s to 190s, satting well on room air.  White count 6.6, hemoglobin 12.3, potassium 3.3, creatinine 0.8.  Initial code stroke CT head grossly stable.  Upon evaluation by admitting hospitalist, patient had new onset left-sided facial droop to which a repeat code stroke was called with neurology reevaluation. formal CVA evaluation including MRI of the brain, CTA of the head neck, TTE, risk stratification labs. On baby aspirin chronically. S/p plavix load in ER. PT agitation precluded timely CTA evaluation.  03/20: CTA H/N neg LVO, moderate atherosclerotic disease. MRI brain 1.2 cm acute ischemic nonhemorrhagic infarct involving the posterior limb of the left internal capsule. Underlying age-related cerebral atrophy with severe chronic microvascular ischemic disease. PT/OT eval pending. Long discussion today w/ family at bedside re: poor  prognosis if patient is not recovering her alertness / po intake. I am concerned for vascular dementia as cause of sudden change, in addition to CVA effects, and if this is the case I am concerned she is unlikely to recover. Will request additional input from neurology team re: prognostication. Discussed code status with daughter - given how patient is looking now, I hesitate to offer feeding tube of any kind pending neurology opinion, and I hesitate to offer CPR/intubation as I do not feel she would recover from resuscitation measures. Will leave as FULL CODE for now but daughter would like to speak w/ family about this further and we will revisit the topic tomorrow.  03/21: see IPAL note - pt DNR, palliative consult. Repeating CT head today - no new stroke. IR to place NG for nutrition --> unsuccessful. Palliative care discussion w/ family following unsuccessful NG placement, they are opting for hospice since NG unsuccessful and they do not wish to pursue PEG tube. TOC aware, hospice to reach out to daughter(s) tomorrow   Consultants:  Neurology   Procedures: None       ASSESSMENT & PLAN:   Principal Problem:   CVA (cerebral vascular accident) ([place]) Active Problems:   Hypothyroidism   HYPERCHOLESTEROLEMIA, PURE   Essential hypertension   GERD   Dementia ([place])  CVA (cerebral vascular accident) DAPT: ASA + Plavix once taking po  Permissive hypertension window has passed - reduce BP gradually Neurology following  Hospice consult    Dysphagia as effect of CVA vs advancing dementia  NPO NG per IR attempted, not able to pass NG SLP following  Dementia Cont namenda, exelon   GERD PPI   Essential hypertension decrease BP by 20% per day to goal of [id number] systolic Unable to take po meds  Labetalol IV prn severe HTN   HLD lipid panel  Start statin if/when able   Hypothyroidism Cont synthroid if/when able     DVT prophylaxis: SCD Pertinent IV fluids/nutrition: npo   Central lines / invasive devices: none   Code Status: FULL CODE   Current Admission Status: inpatient   TOC needs / Dispo plan: pend PT/OT eval but expect may need long term care  Barriers to discharge / significant pending items: clinical improvement vs consideration for transition to hospice / comfort measures if not improving or if worsening.              Subjective / Brief ROS:  Patient alert, aphasic   Family Communication: see IPAL - spoke w/ daughter and grandchildren    Objective Findings:  Vitals:   01/10/23 0613 01/10/23 0829 01/10/23 1103 01/10/23 1232  BP: (!) 180/83 (!) 194/86 (!) 160/92 (!) 188/98  Pulse: 63 65 67 71  Resp:  17  16  Temp:  98.4 F (36.9 C)  99.3 F (37.4 C)  TempSrc:      SpO2:  94%  96%  Weight:      Height:        Intake/Output Summary (Last 24 hours) at 01/10/2023 1519 Last data filed at 01/10/2023 [id number] Gross per 24 hour  Intake 860.8 ml  Output 1500 ml  Net -639.2 ml   Filed Weights   01/08/23 1146  Weight: 75.8 kg    Examination:  Physical Exam Constitutional:      General: She is not in acute distress.    Appearance: She is ill-appearing.  HENT:     Mouth/Throat:     Mouth: Mucous membranes are dry.  Cardiovascular:     Rate and Rhythm: Normal rate and regular rhythm.  Pulmonary:     Effort: Pulmonary effort is normal.     Breath sounds: Normal breath sounds.  Musculoskeletal:     Right lower leg: No edema.     Left lower leg: No edema.  Skin:    General: Skin is warm and dry.  Neurological:     Mental Status: She is alert.     Motor: Weakness present.     Comments: Aphasic - very slurred speech is unintelligible           Scheduled Medications:   amLODipine  10 mg Oral Daily   aspirin EC  325 mg Oral Once   aspirin EC  81 mg Oral Daily   clopidogrel  75 mg Oral Daily   divalproex  125 mg Oral Daily   levothyroxine  88 mcg Oral QAC breakfast   losartan  25 mg Oral BID   memantine  5 mg  Oral Daily   rivastigmine  3 mg Oral BID    Continuous Infusions:  dextrose 5% lactated ringers 75 mL/hr at 01/10/23 0927    PRN Medications:  labetalol, LORazepam  Antimicrobials from admission:  Anti-infectives (From admission, onward)    None           Data Reviewed:  I have personally reviewed the following...  CBC: Recent Labs  Lab 01/08/23 1112 01/09/23 0820  WBC 6.6 7.7  NEUTROABS 4.2  --   HGB 12.3 12.5  HCT 39.6 39.9  MCV 88.2 86.7  PLT 227 0000000   Basic Metabolic Panel: Recent  Labs  Lab 01/08/23 1112 01/09/23 0820  NA 141 139  K 3.3* 3.5  CL 102 103  CO2 30 28  GLUCOSE 81 92  BUN 13 10  CREATININE 0.78 0.78  CALCIUM 8.9 8.8*   GFR: Estimated Creatinine Clearance: 50.2 mL/min (by C-G formula based on SCr of 0.78 mg/dL). Liver Function Tests: Recent Labs  Lab 01/08/23 1112 01/09/23 0820  AST 15 19  ALT 10 9  ALKPHOS 82 77  BILITOT 0.5 0.9  PROT 6.9 6.9  ALBUMIN 3.2* 3.3*   No results for input(s): "LIPASE", "AMYLASE" in the last 168 hours. No results for input(s): "AMMONIA" in the last 168 hours. Coagulation Profile: Recent Labs  Lab 01/08/23 1112  INR 1.0   Cardiac Enzymes: No results for input(s): "CKTOTAL", "CKMB", "CKMBINDEX", "TROPONINI" in the last 168 hours. BNP (last 3 results) No results for input(s): "PROBNP" in the last 8760 hours. HbA1C: Recent Labs    01/08/23 1112  HGBA1C 5.7*   CBG: Recent Labs  Lab 01/08/23 1109 01/08/23 2033 01/09/23 0732 01/09/23 0908 01/09/23 2042  GLUCAP 81 94 80 79 113*   Lipid Profile: Recent Labs    01/09/23 0820  CHOL 237*  HDL 59  LDLCALC 157*  TRIG 106  CHOLHDL 4.0   Thyroid Function Tests: No results for input(s): "TSH", "T4TOTAL", "FREET4", "T3FREE", "THYROIDAB" in the last 72 hours. Anemia Panel: No results for input(s): "VITAMINB12", "FOLATE", "FERRITIN", "TIBC", "IRON", "RETICCTPCT" in the last 72 hours. Most Recent Urinalysis On File:     Component Value  Date/Time   COLORURINE STRAW (A) 01/09/2023 0922   APPEARANCEUR CLEAR (A) 01/09/2023 0922   APPEARANCEUR Clear 05/04/2012 0613   LABSPEC 1.015 01/09/2023 0922   LABSPEC 1.014 05/04/2012 0613   PHURINE 8.0 01/09/2023 0922   GLUCOSEU NEGATIVE 01/09/2023 0922   GLUCOSEU Negative 05/04/2012 0613   HGBUR NEGATIVE 01/09/2023 0922   BILIRUBINUR NEGATIVE 01/09/2023 0922   BILIRUBINUR Negative 05/04/2012 0613   KETONESUR NEGATIVE 01/09/2023 0922   PROTEINUR NEGATIVE 01/09/2023 0922   NITRITE NEGATIVE 01/09/2023 0922   LEUKOCYTESUR NEGATIVE 01/09/2023 0922   LEUKOCYTESUR Negative 05/04/2012 0613   Sepsis Labs: @LABRCNTIP (procalcitonin:4,lacticidven:4) Microbiology: No results found for this or any previous visit (from the past 240 hour(s)).    Radiology Studies last 3 days: CT HEAD WO CONTRAST (5MM)  Result Date: 01/10/2023 CLINICAL DATA:  Stroke, follow-up EXAM: CT HEAD WITHOUT CONTRAST TECHNIQUE: Contiguous axial images were obtained from the base of the skull through the vertex without intravenous contrast. RADIATION DOSE REDUCTION: This exam was performed according to the departmental dose-optimization program which includes automated exposure control, adjustment of the mA and/or kV according to patient size and/or use of iterative reconstruction technique. COMPARISON:  CT head 01/08/2023, MRI head 01/08/2023 FINDINGS: Brain: Increased hypodensity in the left internal capsule, which correlates with the acute infarct noted on the 01/08/2023 MRI. No other new hypodensity to suggest additional acute infarct. No evidence of acute hemorrhage, mass, mass effect, or midline shift. No hydrocephalus or extra-axial fluid collection. Periventricular white matter changes, likely the sequela of chronic small vessel ischemic disease. Vascular: No hyperdense vessel. Skull: Negative for fracture or focal lesion. Sinuses/Orbits: No acute finding. Other: Fluid in the right mastoid air cells. IMPRESSION: 1.  Increased hypodensity in the left internal capsule, which correlates with the acute infarct noted on the 01/08/2023 MRI. No other new hypodensity to suggest additional acute infarct. 2. No acute intracranial hemorrhage. Electronically Signed   By: Merilyn Baba M.D.   On:  01/10/2023 13:39   ECHOCARDIOGRAM COMPLETE  Result Date: 01/09/2023    ECHOCARDIOGRAM REPORT   Patient Name:   CAMREIGH BOBBITT    Date of Exam: 01/08/2023 Medical Rec #:  [id number]     Height:       71.0 in Accession #:    [id number]    Weight:       167.0 lb Date of Birth:  06/07/1928     BSA:          1.953 m Patient Age:    8 years      BP:           192/95 mmHg Patient Gender: F             HR:           60 bpm. Exam Location:  [address] Procedure: 2D Echo, Cardiac Doppler and Color Doppler Indications:     G45.9 TIA  History:         Patient has no prior history of Echocardiogram examinations.                  Risk Factors:Hypertension. Hypothyroidism.  Sonographer:     Cresenciano Lick RDCS Referring Phys:  [place] Diagnosing Phys: Nelva Bush MD IMPRESSIONS  1. Left ventricular ejection fraction, by estimation, is >55%. The left ventricle has normal function. Left ventricular endocardial border not optimally defined to evaluate regional wall motion. There is mild left ventricular hypertrophy. Left ventricular diastolic parameters are consistent with Grade I diastolic dysfunction (impaired relaxation).  2. Right ventricular systolic function is normal. The right ventricular size is normal. There is normal pulmonary artery systolic pressure.  3. The mitral valve is normal in structure. Trivial mitral valve regurgitation. No evidence of mitral stenosis.  4. The aortic valve is tricuspid. There is mild thickening of the aortic valve. Aortic valve regurgitation is mild. Aortic valve sclerosis is present, with no evidence of aortic valve stenosis.  5. There is mild dilatation of the ascending aorta, measuring 37 mm.  6.  The inferior vena cava is normal in size with greater than 50% respiratory variability, suggesting right atrial pressure of 3 mmHg. FINDINGS  Left Ventricle: Left ventricular ejection fraction, by estimation, is >55%. The left ventricle has normal function. Left ventricular endocardial border not optimally defined to evaluate regional wall motion. The left ventricular internal cavity size was  normal in size. There is mild left ventricular hypertrophy. Left ventricular diastolic parameters are consistent with Grade I diastolic dysfunction (impaired relaxation). Right Ventricle: The right ventricular size is normal. No increase in right ventricular wall thickness. Right ventricular systolic function is normal. There is normal pulmonary artery systolic pressure. The tricuspid regurgitant velocity is 1.90 m/s, and  with an assumed right atrial pressure of 3 mmHg, the estimated right ventricular systolic pressure is 0000000 mmHg. Left Atrium: Left atrial size was normal in size. Right Atrium: Right atrial size was normal in size. Pericardium: There is no evidence of pericardial effusion. Mitral Valve: The mitral valve is normal in structure. Trivial mitral valve regurgitation. No evidence of mitral valve stenosis. Tricuspid Valve: The tricuspid valve is grossly normal. Tricuspid valve regurgitation is mild. Aortic Valve: The aortic valve is tricuspid. There is mild thickening of the aortic valve. Aortic valve regurgitation is mild. Aortic valve sclerosis is present, with no evidence of aortic valve stenosis. Pulmonic Valve: The pulmonic valve was not well visualized. Pulmonic valve regurgitation is not visualized. No evidence of pulmonic  stenosis. Aorta: The aortic root is normal in size and structure. There is mild dilatation of the ascending aorta, measuring 37 mm. Pulmonary Artery: The pulmonary artery is of normal size. Venous: The inferior vena cava is normal in size with greater than 50% respiratory variability,  suggesting right atrial pressure of 3 mmHg. IAS/Shunts: No atrial level shunt detected by color flow Doppler.  LEFT VENTRICLE PLAX 2D LVIDd:         3.70 cm   Diastology LVIDs:         2.00 cm   LV e' medial:    4.90 cm/s LV PW:         1.10 cm   LV E/e' medial:  11.0 LV IVS:        1.10 cm   LV e' lateral:   5.33 cm/s LVOT diam:     1.90 cm   LV E/e' lateral: 10.1 LV SV:         56 LV SV Index:   29 LVOT Area:     2.84 cm  RIGHT VENTRICLE             IVC RV Basal diam:  3.70 cm     IVC diam: 1.30 cm RV S prime:     17.05 cm/s TAPSE (M-mode): 2.4 cm LEFT ATRIUM             Index        RIGHT ATRIUM           Index LA diam:        3.70 cm 1.89 cm/m   RA Area:     12.00 cm LA Vol (A2C):   47.3 ml 24.22 ml/m  RA Volume:   32.50 ml  16.64 ml/m LA Vol (A4C):   26.2 ml 13.42 ml/m LA Biplane Vol: 35.3 ml 18.07 ml/m  AORTIC VALVE LVOT Vmax:   84.10 cm/s LVOT Vmean:  55.050 cm/s LVOT VTI:    0.198 m  AORTA Ao Root diam: 3.60 cm Ao Asc diam:  3.70 cm MITRAL VALVE               TRICUSPID VALVE MV Area (PHT): 2.29 cm    TR Peak grad:   14.4 mmHg MV Decel Time: 331 msec    TR Vmax:        190.00 cm/s MV E velocity: 53.75 cm/s MV A velocity: 94.60 cm/s  SHUNTS MV E/A ratio:  0.57        Systemic VTI:  0.20 m                            Systemic Diam: 1.90 cm Nelva Bush MD Electronically signed by Nelva Bush MD Signature Date/Time: 01/09/2023/6:52:46 AM    Final    MR BRAIN WO CONTRAST  Result Date: 01/08/2023 CLINICAL DATA:  Initial evaluation for neuro deficit, stroke suspected. EXAM: MRI HEAD WITHOUT CONTRAST TECHNIQUE: Multiplanar, multiecho pulse sequences of the brain and surrounding structures were obtained without intravenous contrast. COMPARISON:  Prior CTs from earlier the same day. FINDINGS: Brain: Examination degraded by motion artifact. Generalized age-related cerebral atrophy. Extensive patchy and confluent T2/FLAIR hyperintensity involving the periventricular and deep white matter both cerebral  hemispheres as well as the pons, consistent with chronic small vessel ischemic disease, severe in nature. Few tiny remote cerebellar infarcts noted. 1.2 cm focus of restricted diffusion involving the posterior limb of the left internal capsule, consistent with a acute  ischemic infarct (series 5, image 24). No associated hemorrhage or mass effect. No other evidence for acute or subacute ischemia. Gray-white matter differentiation otherwise maintained. No acute intracranial hemorrhage. Few small chronic micro hemorrhages noted, likely small vessel related. No mass lesion, midline shift or mass effect. No hydrocephalus or extra-axial fluid collection. Pituitary gland and suprasellar region within normal limits. Vascular: Major intracranial vascular flow voids are maintained. Skull and upper cervical spine: Craniocervical junction within normal limits. Bone marrow signal intensity normal. No scalp soft tissue abnormality. Sinuses/Orbits: Prior bilateral ocular lens replacement. Paranasal sinuses are largely clear. Small bilateral mastoid effusions, of doubtful significance. Visualized nasopharynx unremarkable. Other: None. IMPRESSION: 1. 1.2 cm acute ischemic nonhemorrhagic infarct involving the posterior limb of the left internal capsule. 2. Underlying age-related cerebral atrophy with severe chronic microvascular ischemic disease. Electronically Signed   By: Jeannine Boga M.D.   On: 01/08/2023 23:57   CT ANGIO HEAD NECK W WO CM  Result Date: 01/08/2023 CLINICAL DATA:  Initial evaluation for neuro deficit, stroke suspected. EXAM: CT ANGIOGRAPHY HEAD AND NECK TECHNIQUE: Multidetector CT imaging of the head and neck was performed using the standard protocol during bolus administration of intravenous contrast. Multiplanar CT image reconstructions and MIPs were obtained to evaluate the vascular anatomy. Carotid stenosis measurements (when applicable) are obtained utilizing NASCET criteria, using the distal  internal carotid diameter as the denominator. RADIATION DOSE REDUCTION: This exam was performed according to the departmental dose-optimization program which includes automated exposure control, adjustment of the mA and/or kV according to patient size and/or use of iterative reconstruction technique. CONTRAST:  75 mL of Omnipaque 350. COMPARISON:  Prior CT from earlier the same day. FINDINGS: CTA NECK FINDINGS Aortic arch: Visualized aortic arch normal in caliber with standard 3 vessel morphology. Mild atheromatous change about the arch itself. No stenosis about the origin the great vessels. Right carotid system: Right common and internal carotid arteries are patent without dissection. Minimal atheromatous change about the right carotid bulb without hemodynamically significant stenosis. Left carotid system: Left common and internal carotid arteries are patent without dissection. Minimal atheromatous change about the left carotid bulb without hemodynamically significant stenosis. Vertebral arteries: Both vertebral arteries arise from the subclavian arteries. No proximal subclavian artery stenosis. Both vertebral arteries widely patent without stenosis, dissection or occlusion. Skeleton: No discrete or worrisome osseous lesions. Mild spondylosis at C4-5 through C6-7. Other neck: No other acute soft tissue abnormality within the neck. Upper chest: Few small pulmonary nodules measuring up to 5 mm are seen clustered within the peripheral left upper lobe (series 4, image 172), indeterminate. Visualized upper chest demonstrates no other acute finding. Review of the MIP images confirms the above findings CTA HEAD FINDINGS Anterior circulation: Mild atheromatous change within the carotid siphons without stenosis. 2 mm outpouching extending inferiorly from the supraclinoid left ICA felt to be most consistent with a small vascular infundibulum. Left A1 segment widely patent. Right A1 hypoplastic and/or absent. Normal anterior  communicating artery complex. Atheromatous irregularity throughout the anterior cerebral arteries without proximal high-grade stenosis. Atheromatous change seen within both M1 segments as well with no more than mild multifocal narrowing. No proximal MCA branch occlusion or high-grade stenosis. Distal MCA branches well perfused, although demonstrates small vessel atheromatous irregularity. Posterior circulation: Both V4 segments patent without stenosis. Left PICA patent. Right PICA origin not well seen. Basilar mildly irregular but patent without high-grade stenosis. Superior cerebellar arteries patent bilaterally. Left PCA supplied via the basilar. Fetal type origin of the right PCA. Both  PCAs patent to their distal aspects without significant stenosis. Venous sinuses: Grossly patent allowing for timing the contrast bolus. Anatomic variants: As above.  No aneurysm. Review of the MIP images confirms the above findings IMPRESSION: 1. Negative CTA for large vessel occlusion or other emergent finding. 2. Moderate intracranial atherosclerotic disease without proximal high-grade or correctable stenosis. 3. Mild atheromatous change about the carotid bifurcations without stenosis. No hemodynamically significant stenosis within the neck. 4. Few small pulmonary nodules measuring up to 5 mm clustered within the peripheral left upper lobe, indeterminate. Per Fleischner Society Guidelines, if patient is low risk for malignancy, no routine follow-up imaging is recommended. If patient is high risk for malignancy, a non-contrast Chest CT at 12 months is optional. If performed and the nodule is stable at 12 months, no further follow-up is recommended. These guidelines do not apply to immunocompromised patients and patients with cancer. Follow up in patients with significant comorbidities as clinically warranted. For lung cancer screening, adhere to Lung-RADS guidelines. Reference: Radiology. 2017; 284(1):228-43. Electronically  Signed   By: Jeannine Boga M.D.   On: 01/08/2023 20:12   CT HEAD CODE STROKE WO CONTRAST  Result Date: 01/08/2023 CLINICAL DATA:  Code stroke. Neuro deficit, acute, stroke suspected. Acute onset slurred speech. History of dementia. History of prior stroke with foot drop. EXAM: CT HEAD WITHOUT CONTRAST TECHNIQUE: Contiguous axial images were obtained from the base of the skull through the vertex without intravenous contrast. RADIATION DOSE REDUCTION: This exam was performed according to the departmental dose-optimization program which includes automated exposure control, adjustment of the mA and/or kV according to patient size and/or use of iterative reconstruction technique. COMPARISON:  Head CT 04/12/2022. FINDINGS: Brain: Mild generalized parenchymal atrophy. Patchy and ill-defined hypoattenuation within the cerebral white matter, nonspecific but compatible with advanced chronic small vessel ischemic disease. Chronic small vessel ischemic changes are also present within the bilateral deep gray nuclei. There is no acute intracranial hemorrhage. No demarcated cortical infarct. No extra-axial fluid collection. No evidence of an intracranial mass. No midline shift. Vascular: No hyperdense vessel.  Atherosclerotic calcifications. Skull: No fracture or aggressive osseous lesion. Sinuses/Orbits: No mass or acute finding within the imaged orbits. No significant paranasal sinus disease at the imaged levels. ASPECTS [facility] Stroke Program Early CT Score) - Ganglionic level infarction (caudate, lentiform nuclei, internal capsule, insula, M1-M3 cortex): 7 - Supraganglionic infarction (M4-M6 cortex): 3 Total score (0-10 with 10 being normal): 10 No evidence of an acute intracranial abnormality. These results were communicated to Dr. Cheral Marker at 11:32 amon 3/19/2024by text page via the [facility] messaging system. IMPRESSION: 1.  No evidence of an acute intracranial abnormality. 2. Parenchymal atrophy and chronic small  vessel disease, as described. Electronically Signed   By: Kellie Simmering D.O.   On: 01/08/2023 11:32             LOS: 1 day    Time spent 50 min    Emeterio Reeve, DO [facility] 01/10/2023, 3:19 PM    Dictation software may have been used to generate the above note. Typos may occur and escape review in typed/dictated notes. Please contact Dr Sheppard Coil directly for clarity if needed.  Staff may message me via secure chat in [place]  but this may not receive an immediate response,  please page me for urgent matters!  If 7PM-7AM, please contact night coverage [email or web address]

## 2023-01-11 DIAGNOSIS — F03B11 Unspecified dementia, moderate, with agitation: Secondary | ICD-10-CM | POA: Diagnosis not present

## 2023-01-11 DIAGNOSIS — I639 Cerebral infarction, unspecified: Secondary | ICD-10-CM | POA: Diagnosis not present

## 2023-01-11 DIAGNOSIS — R4781 Slurred speech: Secondary | ICD-10-CM | POA: Diagnosis not present

## 2023-01-11 DIAGNOSIS — Z7189 Other specified counseling: Secondary | ICD-10-CM | POA: Diagnosis not present

## 2023-01-11 MED ORDER — HYDRALAZINE HCL 20 MG/ML IJ SOLN
10.0000 mg | Freq: Four times a day (QID) | INTRAMUSCULAR | Status: DC
  Administered 2023-01-11 – 2023-01-15 (×17): 10 mg via INTRAVENOUS
  Filled 2023-01-11 (×18): qty 1

## 2023-01-11 MED ORDER — POLYVINYL ALCOHOL 1.4 % OP SOLN: 1.0000 [drp] | Freq: Four times a day (QID) | OPHTHALMIC | Status: DC | PRN

## 2023-01-11 MED ORDER — BIOTENE DRY MOUTH MT LIQD: 15.0000 mL | OROMUCOSAL | Status: DC | PRN

## 2023-01-11 MED ORDER — ENOXAPARIN SODIUM 40 MG/0.4ML IJ SOSY
40.0000 mg | PREFILLED_SYRINGE | INTRAMUSCULAR | Status: DC
  Administered 2023-01-11 – 2023-01-12 (×2): 40 mg via SUBCUTANEOUS
  Filled 2023-01-11 (×2): qty 0.4

## 2023-01-11 NOTE — TOC Progression Note (Addendum)
Transition of Care [facility]) - Progression Note    Patient Details  Name: Bethany Hicks MRN: [id number] Date of Birth: August 29, 1928  Transition of Care [facility]) CM/SW [place], LCSW Phone Number: 01/11/2023, 9:32 AM  Clinical Narrative:   Sent referral information to Doreatha Lew, RN with [facility]  1:18 pm: Spoke with Vaughan Basta at [facility]. She is going to confirm they can accept patient back with hospice services and call CSW back.  2:23 pm: Ed Weeks from [facility] is going to come assess patient in about 30 minutes. Daughter Curt Bears is aware.  5:07 No call back from [place]. Left her a voicemail. Also left Ed a voicemail.  Expected Discharge Plan and Services                                               Social Determinants of Health (SDOH) Interventions SDOH Screenings   Food Insecurity: Patient Unable To Answer (01/09/2023)  Tobacco Use: Medium Risk (01/09/2023)    Readmission Risk Interventions     No data to display

## 2023-01-11 NOTE — Progress Notes (Signed)
Manufacturing systems engineer Liaison Note  Received request from [place] Scrape, [facility], for  Hospice Home.  Patient does not meet criteria for InPatient Hospice Unit.  Patient does qualify for hospice services at [facility] after discharge from [facility]. Spoke with Gwynneth Albright, patient's daughter,  to initiate education related to hospice philosophy, services, and team approach to care. Patient/family verbalized understanding of information given.   [name] would like to proceed with Hospice at [facility] if they will take her back under hospice services.  Updated [place] Scrape, SW TOC of request and that patient does not meet criteria for InPatient Hospice [facility]).    DME needs discussed. Patient has the following equipment in the home:  [facility], [place] chair, 2 wheeled walker and wheelchair. Patient/family requests the following equipment for delivery: hospital bed and over bed table  Please send signed and completed DNR home with patient/family if applicable.  Please provide prescriptions at discharge as needed to ensure ongoing symptom management.  [facility] information and contact numbers given to [facility] and my card was left in the room.  Continued collaboration with patient/family and [facility] staff is ongoing through final disposition.  Please call with any hospice related questions or concerns. Thank you for the opportunity to participate in this patient's care.  Dimas Aguas, RN Nurse Liaison [phone]

## 2023-01-11 NOTE — Progress Notes (Signed)
Daily Progress Note   Patient Name: Bethany Hicks      Date: 01/11/2023 DOB: Jul 10, 1928  Age: 89 y.o. MRN#: [id number] Attending Physician: Emeterio Reeve, DO Primary Care Physician: Rusty Aus, MD Admit Date: 01/08/2023  Reason for Consultation/Follow-up: Establishing goals of care  HPI/Brief Hospital Review: 89 y.o. female  with past medical history of mixed Alzheimer's and vascular dementia, major depressive disorder, HLD, B12 deficiency, hypothyroidism, and chronic hyperglycemia admitted on from [facility] 01/08/2023 with slurred speech.    CT of the head revealed no acute intracranial abnormality but parenchymal atrophy and extensive chronic small vessel disease was noted.   CTA of head and neck is negative for LVO but had a moderate intracranial atherosclerotic disease.   MRI of the brain revealed a 1.2 cm acute ischemic nonhemorrhagic infarct involving the posterior limb of the left internal capsule.  And also displayed underlying age-related cerebral atrophy with severe chronic microvascular ischemic disease.   TTE revealed LVEF of greater than 55% with left ventricular function WNL.   PMT was consulted to discuss goals of care.  Pending hospice referral per family request to happen today-3/22.  Subjective: Extensive chart review has been completed prior to meeting patient including labs, vital signs, imaging, progress notes, orders, and available advanced directive documents from current and previous encounters.    Visited with Ms. Schrag at her bedside. Caregiver present during time of visit. Ms. Cajina awake and alert, pleasantly confused. Remains dysarthric but able to express she was thirsty. Denies acute pain or discomfort.  Discussed with Doreatha Lew, RN [facility] liaison.  Marcene Brawn spoke with daughter-Kathy, desire to proceed with hospice services. Not eligible for inpatient hospice at this time. [name] wishes for Ms. Landen to return to [facility] under hospice services if possible, TOC assisting with disposition.  Called and spoke with daughter-Kathy. Confirmed families desire to pursue hospice services. Discussed in the interim transitioning to comfort care.  Explained Ms. Turchetta would no longer receive aggressive medical interventions such as continuous vital signs, lab work or radiology testing.  All care would focus on how the patient is looking and feeling. This would include management of any symptoms that may cause discomfort, pain, shortness of breath, cough, nausea, agitation, anxiety, and/or secretions etc. Symptoms would be managed with medications and other non-pharmacological interventions such  as spiritual support if requested, repositioning, music therapy, or therapeutic listening. Daughter-Kathy verbalized understanding and appreciation and agrees with transitioning to full comfort measures.  We also discussed comfort feeding. Allowing Ms. Luhrsen to have soft foods such as mashed potatoes, applesauce, ice cream and other foods with similar consistency. Occasional sips of liquid. Emphasized high risk of aspiration and in the event of aspiration aggressive medical interventions would not be pursued. Daughter-Kathy verbalized understanding and agrees with providing comfort feeds.  Symptom burden remains low at this time. Will monitor closely and adjust orders as appropriate.  Addressed all questions and concerns at this time.  Objective:  Physical Exam Constitutional:      General: She is not in acute distress.    Appearance: She is ill-appearing.  Pulmonary:     Effort: Pulmonary effort is normal. No respiratory distress.  Abdominal:     General: Abdomen is flat. There is no distension.     Tenderness: There is no abdominal tenderness.  Skin:    General:  Skin is warm and dry.  Neurological:     Mental Status: She is alert.     Cranial Nerves: Dysarthria present.     Motor: Weakness present.             Vital Signs: BP (!) 179/78 (BP Location: Right Arm)   Pulse 68   Temp 98.2 F (36.8 C) (Oral)   Resp 18   Ht 5\' 11"  (1.803 m)   Wt 75.8 kg   SpO2 94%   BMI 23.29 kg/m  SpO2: SpO2: 94 % O2 Device: O2 Device: Room Air O2 Flow Rate:     Palliative Care Assessment & Plan   Assessment/Recommendation/Plan  DNR Transition to comfort care with comfort feeds-orders placed Symptoms burden low-medications to be added if symptoms arise Awaiting disposition under hospice services-TOC aware PMT to continue to follow for ongoing needs and support  Care plan was discussed with nursing staff, TOC, [place] liaison and primary team.  Thank you for allowing the Palliative Medicine Team to assist in the care of this patient.  Total time:  25 minutes  Time spent includes: Detailed review of medical records (labs, imaging, vital signs), medically appropriate exam (mental status, respiratory, cardiac, skin), discussed with treatment team, counseling and educating patient, family and staff, documenting clinical information, medication management and coordination of care.  Theodoro Grist, DNP, AGNP-C Palliative Medicine   Please contact Palliative Medicine Team phone at [phone] for questions and concerns.

## 2023-01-11 NOTE — Progress Notes (Signed)
Nutrition Brief Note  Chart reviewed. Pt now transitioning to comfort care. Per chart review, NG tube placement in IR was unsuccessful. Family does not want to proceed with further NGT or PEG placement. Plan to transition to residential hospice.  No further nutrition interventions planned at this time.  Please re-consult as needed.   Loistine Chance, RD, LDN, [place] Registered Dietitian II Certified Diabetes Care and Education Specialist Please refer to [facility] for RD and/or RD on-call/weekend/after hours pager

## 2023-01-11 NOTE — Progress Notes (Signed)
PT Cancellation Note  Patient Details Name: Bethany Hicks MRN: [id number] DOB: Sep 12, 1928   Cancelled Treatment:    Reason Eval/Treat Not Completed: Other (comment). Patient has become comfort care. Signing off at this time.    Emelda Kohlbeck 01/11/2023, 1:19 PM

## 2023-01-11 NOTE — Progress Notes (Signed)
SLP Cancellation Note  Patient Details Name: Bethany Hicks MRN: [id number] DOB: Feb 05, 1928   Cancelled treatment:       Reason Eval/Treat Not Completed:  (chart reviewed; consulted team.) NGT placement was unsuccessful yesterday. Palliative Care discussion w/ family following unsuccessful NG placement revealed they are opting for Hospice since NG unsuccessful and they do Not wish to pursue PEG tube. TOC aware, Hospice to reach out to daughter(s) tomorrow  03/22: per MD note, pt goes back and forth alert/somnolent, difficult to understand speech but seems to be asking for water/food. Palliative and Hospice to address w/ family re: Comfort feeds. D/c po meds for now except ASA/plavix, BP meds IV, may consider comfort measures and avoid po meds altogether.  ST services will sign off at this time w/ MD/NSG to reconsult for any education/needs if they arise while pt is admitted. MD agreed. Recommend continued oral care for hygiene and stimulation of swallowing, comfort.      Orinda Kenner, MS, CCC-SLP Speech Language Pathologist Rehab Services; [place] [phone] (ascom) Kita Neace 01/11/2023, 12:54 PM

## 2023-01-11 NOTE — Progress Notes (Signed)
OT Cancellation Note  Patient Details Name: Bethany Hicks MRN: [id number] DOB: 1928-08-21   Cancelled Treatment:    Reason Eval/Treat Not Completed: Other (comment). Per chart review, pt now on comfort care measures. OT to complete orders at this time.   Darleen Crocker, MS, OTR/L , CBIS ascom [phone]  01/11/23, 1:18 PM

## 2023-01-11 NOTE — Progress Notes (Signed)
PROGRESS NOTE    Bethany Hicks   [id number] DOB: 1928/09/17  DOA: 01/08/2023 Date of Service: 01/11/23 PCP: Rusty Aus, MD     Brief Narrative / Hospital Course:  Bethany Hicks is a 89 y.o. female with medical history significant of hypertension, hypothyroidism, dementia presenting with CVA.  Patient noted with slurred speech at facility around 930 this morning.  This is well off baseline per report.  Per report, patient was eating breakfast like normal prior to this.  Family was called to which patient's daughter went to evaluate the patient with patient noted slurred speech.  Patient was then taken to the ER by car with improvement in symptoms upon arrival.  03/19: Code stroke was called upon arrival.  Initial imaging grossly negative.  Of note patient also had some mild confusion within the past month including urine and labs that were done at primary care's office.  No reports of fevers or chills.  Reports no nausea or vomiting.  Reports remote history of CVA diagnosed years ago as an incidental finding. Presented to the ER afebrile, systolic pressures in the 180s to 190s, satting well on room air.  White count 6.6, hemoglobin 12.3, potassium 3.3, creatinine 0.8.  Initial code stroke CT head grossly stable.  Upon evaluation by admitting hospitalist, patient had new onset left-sided facial droop to which a repeat code stroke was called with neurology reevaluation. formal CVA evaluation including MRI of the brain, CTA of the head neck, TTE, risk stratification labs. On baby aspirin chronically. S/p plavix load in ER. PT agitation precluded timely CTA evaluation.  03/20: CTA H/N neg LVO, moderate atherosclerotic disease. MRI brain 1.2 cm acute ischemic nonhemorrhagic infarct involving the posterior limb of the left internal capsule. Underlying age-related cerebral atrophy with severe chronic microvascular ischemic disease. PT/OT eval pending. Long discussion today w/ family at bedside re: poor  prognosis if patient is not recovering her alertness / po intake. I am concerned for vascular dementia as cause of sudden change, in addition to CVA effects, and if this is the case I am concerned she is unlikely to recover. Will request additional input from neurology team re: prognostication. Discussed code status with daughter - given how patient is looking now, I hesitate to offer feeding tube of any kind pending neurology opinion, and I hesitate to offer CPR/intubation as I do not feel she would recover from resuscitation measures. Will leave as FULL CODE for now but daughter would like to speak w/ family about this further and we will revisit the topic tomorrow.  03/21: see IPAL note - pt DNR, palliative consult. Repeating CT head today - no new stroke. IR to place NG for nutrition --> unsuccessful. Palliative care discussion w/ family following unsuccessful NG placement, they are opting for hospice since NG unsuccessful and they do not wish to pursue PEG tube. TOC aware, hospice to reach out to daughter(s) tomorrow  03/22: stable. Pt goes back and forth alert/somnolent, difficult to understand speech but seems to be asking for water/food. Palliative and hospice to address w/ family re: comfort feeds. D/c po meds for now except ASA/plavix, BP meds IV, may consider comfort measures and avoid po meds altogether   Consultants:  Neurology   Procedures: None       ASSESSMENT & PLAN:   Principal Problem:   CVA (cerebral vascular accident) ([place]) Active Problems:   Hypothyroidism   HYPERCHOLESTEROLEMIA, PURE   Essential hypertension   GERD   Dementia ([place])  CVA (  cerebral vascular accident) DAPT: ASA + Plavix as able  Hospice consult    Dysphagia as effect of CVA vs advancing dementia  NPO NG per IR attempted, not able to pass NG SLP following  Considering comfort feeds Minimize po meds   Dementia D/c namenda, exelon d/t NPO   GERD D/c po PPI    Essential hypertension Unable  to take po meds  Hydralazine IV scheduled Labetalol IV prn severe HTN   HLD D/c statin d/t NPO  Hypothyroidism Cont synthroid if/when able     DVT prophylaxis: lovenox  Pertinent IV fluids/nutrition: npo / consider comfort feeds  Central lines / invasive devices: none   Code Status: DNR   Current Admission Status: inpatient   TOC needs / Dispo plan: pend hospice eval  Barriers to discharge / significant pending items: clinical improvement vs consideration for transition to hospice / comfort measures if not improving or if worsening. Expect may be here through the weekend pending placement/DME/home             Subjective / Brief ROS:  Patient alert, aphasic   Family Communication: caregiver at bedside.     Objective Findings:  Vitals:   01/11/23 0508 01/11/23 0808 01/11/23 0809 01/11/23 0940  BP: (!) 163/93 (!) 197/92 (!) 190/93 (!) 180/78  Pulse: 73 65 68 68  Resp: 16 18    Temp: 98.1 F (36.7 C) 98 F (36.7 C)    TempSrc:      SpO2: 95% 97%    Weight:      Height:        Intake/Output Summary (Last 24 hours) at 01/11/2023 1152 Last data filed at 01/11/2023 0500 Gross per 24 hour  Intake 613.51 ml  Output 800 ml  Net -186.49 ml   Filed Weights   01/08/23 1146  Weight: 75.8 kg    Examination:  Physical Exam Constitutional:      General: She is not in acute distress.    Appearance: She is ill-appearing.  HENT:     Mouth/Throat:     Mouth: Mucous membranes are dry.  Cardiovascular:     Rate and Rhythm: Normal rate and regular rhythm.  Pulmonary:     Effort: Pulmonary effort is normal.     Breath sounds: Normal breath sounds.  Musculoskeletal:     Right lower leg: No edema.     Left lower leg: No edema.  Skin:    General: Skin is warm and dry.  Neurological:     Mental Status: She is alert.     Motor: Weakness present.     Comments: Aphasic - very slurred speech is unintelligible           Scheduled Medications:   aspirin  EC  325 mg Oral Once   aspirin EC  81 mg Oral Daily   clopidogrel  75 mg Oral Daily   enoxaparin (LOVENOX) injection  40 mg Subcutaneous Q24H   hydrALAZINE  10 mg Intravenous Q6H    Continuous Infusions:  dextrose 5% lactated ringers 75 mL/hr at 01/11/23 1135    PRN Medications:  labetalol  Antimicrobials from admission:  Anti-infectives (From admission, onward)    None           Data Reviewed:  I have personally reviewed the following...  CBC: Recent Labs  Lab 01/08/23 1112 01/09/23 0820  WBC 6.6 7.7  NEUTROABS 4.2  --   HGB 12.3 12.5  HCT 39.6 39.9  MCV 88.2 86.7  PLT 227  0000000   Basic Metabolic Panel: Recent Labs  Lab 01/08/23 1112 01/09/23 0820  NA 141 139  K 3.3* 3.5  CL 102 103  CO2 30 28  GLUCOSE 81 92  BUN 13 10  CREATININE 0.78 0.78  CALCIUM 8.9 8.8*   GFR: Estimated Creatinine Clearance: 50.2 mL/min (by C-G formula based on SCr of 0.78 mg/dL). Liver Function Tests: Recent Labs  Lab 01/08/23 1112 01/09/23 0820  AST 15 19  ALT 10 9  ALKPHOS 82 77  BILITOT 0.5 0.9  PROT 6.9 6.9  ALBUMIN 3.2* 3.3*   No results for input(s): "LIPASE", "AMYLASE" in the last 168 hours. No results for input(s): "AMMONIA" in the last 168 hours. Coagulation Profile: Recent Labs  Lab 01/08/23 1112  INR 1.0   Cardiac Enzymes: No results for input(s): "CKTOTAL", "CKMB", "CKMBINDEX", "TROPONINI" in the last 168 hours. BNP (last 3 results) No results for input(s): "PROBNP" in the last 8760 hours. HbA1C: No results for input(s): "HGBA1C" in the last 72 hours.  CBG: Recent Labs  Lab 01/08/23 1109 01/08/23 2033 01/09/23 0732 01/09/23 0908 01/09/23 2042  GLUCAP 81 94 80 79 113*   Lipid Profile: Recent Labs    01/09/23 0820  CHOL 237*  HDL 59  LDLCALC 157*  TRIG 106  CHOLHDL 4.0   Thyroid Function Tests: No results for input(s): "TSH", "T4TOTAL", "FREET4", "T3FREE", "THYROIDAB" in the last 72 hours. Anemia Panel: No results for input(s):  "VITAMINB12", "FOLATE", "FERRITIN", "TIBC", "IRON", "RETICCTPCT" in the last 72 hours. Most Recent Urinalysis On File:     Component Value Date/Time   COLORURINE STRAW (A) 01/09/2023 0922   APPEARANCEUR CLEAR (A) 01/09/2023 0922   APPEARANCEUR Clear 05/04/2012 0613   LABSPEC 1.015 01/09/2023 0922   LABSPEC 1.014 05/04/2012 0613   PHURINE 8.0 01/09/2023 0922   GLUCOSEU NEGATIVE 01/09/2023 0922   GLUCOSEU Negative 05/04/2012 0613   HGBUR NEGATIVE 01/09/2023 0922   BILIRUBINUR NEGATIVE 01/09/2023 0922   BILIRUBINUR Negative 05/04/2012 0613   KETONESUR NEGATIVE 01/09/2023 0922   PROTEINUR NEGATIVE 01/09/2023 0922   NITRITE NEGATIVE 01/09/2023 0922   LEUKOCYTESUR NEGATIVE 01/09/2023 0922   LEUKOCYTESUR Negative 05/04/2012 0613   Sepsis Labs: @LABRCNTIP (procalcitonin:4,lacticidven:4) Microbiology: No results found for this or any previous visit (from the past 240 hour(s)).    Radiology Studies last 3 days: DG [place] Dys Tube Plc W/Fl W/Rad  Result Date: 01/10/2023 CLINICAL DATA:  Provided history: Encounter for nasogastric tube placement. EXAM: NASO G TUBE PLACEMENT WITH FL AND WITH RAD CONTRAST:  None. FLUOROSCOPY: Fluoroscopy Time:  6 seconds Radiation Exposure Index (if provided by the fluoroscopic device): 0.5 mGy. Number of Acquired Spot Images: None. COMPARISON:  None. FINDINGS: Multiple attempts were made to place a nasogastric tube via the right and left nostrils. However, during each attempt significant resistance was encountered, and the tube could not be advanced beyond the level of the nasal passages. IMPRESSION: Unsuccessful nasogastric tube placement. The tube could not be advanced beyond the nasal passages. Electronically Signed   By: Kellie Simmering D.O.   On: 01/10/2023 15:20   CT HEAD WO CONTRAST (5MM)  Result Date: 01/10/2023 CLINICAL DATA:  Stroke, follow-up EXAM: CT HEAD WITHOUT CONTRAST TECHNIQUE: Contiguous axial images were obtained from the base of the skull through the  vertex without intravenous contrast. RADIATION DOSE REDUCTION: This exam was performed according to the departmental dose-optimization program which includes automated exposure control, adjustment of the mA and/or kV according to patient size and/or use of iterative reconstruction technique. COMPARISON:  CT head 01/08/2023, MRI head 01/08/2023 FINDINGS: Brain: Increased hypodensity in the left internal capsule, which correlates with the acute infarct noted on the 01/08/2023 MRI. No other new hypodensity to suggest additional acute infarct. No evidence of acute hemorrhage, mass, mass effect, or midline shift. No hydrocephalus or extra-axial fluid collection. Periventricular white matter changes, likely the sequela of chronic small vessel ischemic disease. Vascular: No hyperdense vessel. Skull: Negative for fracture or focal lesion. Sinuses/Orbits: No acute finding. Other: Fluid in the right mastoid air cells. IMPRESSION: 1. Increased hypodensity in the left internal capsule, which correlates with the acute infarct noted on the 01/08/2023 MRI. No other new hypodensity to suggest additional acute infarct. 2. No acute intracranial hemorrhage. Electronically Signed   By: Merilyn Baba M.D.   On: 01/10/2023 13:39   ECHOCARDIOGRAM COMPLETE  Result Date: 01/09/2023    ECHOCARDIOGRAM REPORT   Patient Name:   Bethany Hicks    Date of Exam: 01/08/2023 Medical Rec #:  [id number]     Height:       71.0 in Accession #:    [id number]    Weight:       167.0 lb Date of Birth:  [birth year]     BSA:          1.953 m Patient Age:    80 years      BP:           192/95 mmHg Patient Gender: F             HR:           60 bpm. Exam Location:  [address] Procedure: 2D Echo, Cardiac Doppler and Color Doppler Indications:     G45.9 TIA  History:         Patient has no prior history of Echocardiogram examinations.                  Risk Factors:Hypertension. Hypothyroidism.  Sonographer:     Cresenciano Lick RDCS Referring Phys:  [place] Diagnosing Phys: Nelva Bush MD IMPRESSIONS  1. Left ventricular ejection fraction, by estimation, is >55%. The left ventricle has normal function. Left ventricular endocardial border not optimally defined to evaluate regional wall motion. There is mild left ventricular hypertrophy. Left ventricular diastolic parameters are consistent with Grade I diastolic dysfunction (impaired relaxation).  2. Right ventricular systolic function is normal. The right ventricular size is normal. There is normal pulmonary artery systolic pressure.  3. The mitral valve is normal in structure. Trivial mitral valve regurgitation. No evidence of mitral stenosis.  4. The aortic valve is tricuspid. There is mild thickening of the aortic valve. Aortic valve regurgitation is mild. Aortic valve sclerosis is present, with no evidence of aortic valve stenosis.  5. There is mild dilatation of the ascending aorta, measuring 37 mm.  6. The inferior vena cava is normal in size with greater than 50% respiratory variability, suggesting right atrial pressure of 3 mmHg. FINDINGS  Left Ventricle: Left ventricular ejection fraction, by estimation, is >55%. The left ventricle has normal function. Left ventricular endocardial border not optimally defined to evaluate regional wall motion. The left ventricular internal cavity size was  normal in size. There is mild left ventricular hypertrophy. Left ventricular diastolic parameters are consistent with Grade I diastolic dysfunction (impaired relaxation). Right Ventricle: The right ventricular size is normal. No increase in right ventricular wall thickness. Right ventricular systolic function is normal. There is normal pulmonary artery systolic pressure. The tricuspid  regurgitant velocity is 1.90 m/s, and  with an assumed right atrial pressure of 3 mmHg, the estimated right ventricular systolic pressure is 0000000 mmHg. Left Atrium: Left atrial size was normal in size. Right Atrium: Right  atrial size was normal in size. Pericardium: There is no evidence of pericardial effusion. Mitral Valve: The mitral valve is normal in structure. Trivial mitral valve regurgitation. No evidence of mitral valve stenosis. Tricuspid Valve: The tricuspid valve is grossly normal. Tricuspid valve regurgitation is mild. Aortic Valve: The aortic valve is tricuspid. There is mild thickening of the aortic valve. Aortic valve regurgitation is mild. Aortic valve sclerosis is present, with no evidence of aortic valve stenosis. Pulmonic Valve: The pulmonic valve was not well visualized. Pulmonic valve regurgitation is not visualized. No evidence of pulmonic stenosis. Aorta: The aortic root is normal in size and structure. There is mild dilatation of the ascending aorta, measuring 37 mm. Pulmonary Artery: The pulmonary artery is of normal size. Venous: The inferior vena cava is normal in size with greater than 50% respiratory variability, suggesting right atrial pressure of 3 mmHg. IAS/Shunts: No atrial level shunt detected by color flow Doppler.  LEFT VENTRICLE PLAX 2D LVIDd:         3.70 cm   Diastology LVIDs:         2.00 cm   LV e' medial:    4.90 cm/s LV PW:         1.10 cm   LV E/e' medial:  11.0 LV IVS:        1.10 cm   LV e' lateral:   5.33 cm/s LVOT diam:     1.90 cm   LV E/e' lateral: 10.1 LV SV:         56 LV SV Index:   29 LVOT Area:     2.84 cm  RIGHT VENTRICLE             IVC RV Basal diam:  3.70 cm     IVC diam: 1.30 cm RV S prime:     17.05 cm/s TAPSE (M-mode): 2.4 cm LEFT ATRIUM             Index        RIGHT ATRIUM           Index LA diam:        3.70 cm 1.89 cm/m   RA Area:     12.00 cm LA Vol (A2C):   47.3 ml 24.22 ml/m  RA Volume:   32.50 ml  16.64 ml/m LA Vol (A4C):   26.2 ml 13.42 ml/m LA Biplane Vol: 35.3 ml 18.07 ml/m  AORTIC VALVE LVOT Vmax:   84.10 cm/s LVOT Vmean:  55.050 cm/s LVOT VTI:    0.198 m  AORTA Ao Root diam: 3.60 cm Ao Asc diam:  3.70 cm MITRAL VALVE               TRICUSPID VALVE MV  Area (PHT): 2.29 cm    TR Peak grad:   14.4 mmHg MV Decel Time: 331 msec    TR Vmax:        190.00 cm/s MV E velocity: 53.75 cm/s MV A velocity: 94.60 cm/s  SHUNTS MV E/A ratio:  0.57        Systemic VTI:  0.20 m                            Systemic Diam: 1.90 cm [place] Gave  End MD Electronically signed by Nelva Bush MD Signature Date/Time: 01/09/2023/6:52:46 AM    Final    MR BRAIN WO CONTRAST  Result Date: 01/08/2023 CLINICAL DATA:  Initial evaluation for neuro deficit, stroke suspected. EXAM: MRI HEAD WITHOUT CONTRAST TECHNIQUE: Multiplanar, multiecho pulse sequences of the brain and surrounding structures were obtained without intravenous contrast. COMPARISON:  Prior CTs from earlier the same day. FINDINGS: Brain: Examination degraded by motion artifact. Generalized age-related cerebral atrophy. Extensive patchy and confluent T2/FLAIR hyperintensity involving the periventricular and deep white matter both cerebral hemispheres as well as the pons, consistent with chronic small vessel ischemic disease, severe in nature. Few tiny remote cerebellar infarcts noted. 1.2 cm focus of restricted diffusion involving the posterior limb of the left internal capsule, consistent with a acute ischemic infarct (series 5, image 24). No associated hemorrhage or mass effect. No other evidence for acute or subacute ischemia. Gray-white matter differentiation otherwise maintained. No acute intracranial hemorrhage. Few small chronic micro hemorrhages noted, likely small vessel related. No mass lesion, midline shift or mass effect. No hydrocephalus or extra-axial fluid collection. Pituitary gland and suprasellar region within normal limits. Vascular: Major intracranial vascular flow voids are maintained. Skull and upper cervical spine: Craniocervical junction within normal limits. Bone marrow signal intensity normal. No scalp soft tissue abnormality. Sinuses/Orbits: Prior bilateral ocular lens replacement. Paranasal sinuses  are largely clear. Small bilateral mastoid effusions, of doubtful significance. Visualized nasopharynx unremarkable. Other: None. IMPRESSION: 1. 1.2 cm acute ischemic nonhemorrhagic infarct involving the posterior limb of the left internal capsule. 2. Underlying age-related cerebral atrophy with severe chronic microvascular ischemic disease. Electronically Signed   By: Jeannine Boga M.D.   On: 01/08/2023 23:57   CT ANGIO HEAD NECK W WO CM  Result Date: 01/08/2023 CLINICAL DATA:  Initial evaluation for neuro deficit, stroke suspected. EXAM: CT ANGIOGRAPHY HEAD AND NECK TECHNIQUE: Multidetector CT imaging of the head and neck was performed using the standard protocol during bolus administration of intravenous contrast. Multiplanar CT image reconstructions and MIPs were obtained to evaluate the vascular anatomy. Carotid stenosis measurements (when applicable) are obtained utilizing NASCET criteria, using the distal internal carotid diameter as the denominator. RADIATION DOSE REDUCTION: This exam was performed according to the departmental dose-optimization program which includes automated exposure control, adjustment of the mA and/or kV according to patient size and/or use of iterative reconstruction technique. CONTRAST:  75 mL of Omnipaque 350. COMPARISON:  Prior CT from earlier the same day. FINDINGS: CTA NECK FINDINGS Aortic arch: Visualized aortic arch normal in caliber with standard 3 vessel morphology. Mild atheromatous change about the arch itself. No stenosis about the origin the great vessels. Right carotid system: Right common and internal carotid arteries are patent without dissection. Minimal atheromatous change about the right carotid bulb without hemodynamically significant stenosis. Left carotid system: Left common and internal carotid arteries are patent without dissection. Minimal atheromatous change about the left carotid bulb without hemodynamically significant stenosis. Vertebral  arteries: Both vertebral arteries arise from the subclavian arteries. No proximal subclavian artery stenosis. Both vertebral arteries widely patent without stenosis, dissection or occlusion. Skeleton: No discrete or worrisome osseous lesions. Mild spondylosis at C4-5 through C6-7. Other neck: No other acute soft tissue abnormality within the neck. Upper chest: Few small pulmonary nodules measuring up to 5 mm are seen clustered within the peripheral left upper lobe (series 4, image 172), indeterminate. Visualized upper chest demonstrates no other acute finding. Review of the MIP images confirms the above findings CTA HEAD FINDINGS Anterior circulation:  Mild atheromatous change within the carotid siphons without stenosis. 2 mm outpouching extending inferiorly from the supraclinoid left ICA felt to be most consistent with a small vascular infundibulum. Left A1 segment widely patent. Right A1 hypoplastic and/or absent. Normal anterior communicating artery complex. Atheromatous irregularity throughout the anterior cerebral arteries without proximal high-grade stenosis. Atheromatous change seen within both M1 segments as well with no more than mild multifocal narrowing. No proximal MCA branch occlusion or high-grade stenosis. Distal MCA branches well perfused, although demonstrates small vessel atheromatous irregularity. Posterior circulation: Both V4 segments patent without stenosis. Left PICA patent. Right PICA origin not well seen. Basilar mildly irregular but patent without high-grade stenosis. Superior cerebellar arteries patent bilaterally. Left PCA supplied via the basilar. Fetal type origin of the right PCA. Both PCAs patent to their distal aspects without significant stenosis. Venous sinuses: Grossly patent allowing for timing the contrast bolus. Anatomic variants: As above.  No aneurysm. Review of the MIP images confirms the above findings IMPRESSION: 1. Negative CTA for large vessel occlusion or other emergent  finding. 2. Moderate intracranial atherosclerotic disease without proximal high-grade or correctable stenosis. 3. Mild atheromatous change about the carotid bifurcations without stenosis. No hemodynamically significant stenosis within the neck. 4. Few small pulmonary nodules measuring up to 5 mm clustered within the peripheral left upper lobe, indeterminate. Per Fleischner Society Guidelines, if patient is low risk for malignancy, no routine follow-up imaging is recommended. If patient is high risk for malignancy, a non-contrast Chest CT at 12 months is optional. If performed and the nodule is stable at 12 months, no further follow-up is recommended. These guidelines do not apply to immunocompromised patients and patients with cancer. Follow up in patients with significant comorbidities as clinically warranted. For lung cancer screening, adhere to Lung-RADS guidelines. Reference: Radiology. 2017; 284(1):228-43. Electronically Signed   By: Jeannine Boga M.D.   On: 01/08/2023 20:12   CT HEAD CODE STROKE WO CONTRAST  Result Date: 01/08/2023 CLINICAL DATA:  Code stroke. Neuro deficit, acute, stroke suspected. Acute onset slurred speech. History of dementia. History of prior stroke with foot drop. EXAM: CT HEAD WITHOUT CONTRAST TECHNIQUE: Contiguous axial images were obtained from the base of the skull through the vertex without intravenous contrast. RADIATION DOSE REDUCTION: This exam was performed according to the departmental dose-optimization program which includes automated exposure control, adjustment of the mA and/or kV according to patient size and/or use of iterative reconstruction technique. COMPARISON:  Head CT 04/12/2022. FINDINGS: Brain: Mild generalized parenchymal atrophy. Patchy and ill-defined hypoattenuation within the cerebral white matter, nonspecific but compatible with advanced chronic small vessel ischemic disease. Chronic small vessel ischemic changes are also present within the  bilateral deep gray nuclei. There is no acute intracranial hemorrhage. No demarcated cortical infarct. No extra-axial fluid collection. No evidence of an intracranial mass. No midline shift. Vascular: No hyperdense vessel.  Atherosclerotic calcifications. Skull: No fracture or aggressive osseous lesion. Sinuses/Orbits: No mass or acute finding within the imaged orbits. No significant paranasal sinus disease at the imaged levels. ASPECTS [facility] Stroke Program Early CT Score) - Ganglionic level infarction (caudate, lentiform nuclei, internal capsule, insula, M1-M3 cortex): 7 - Supraganglionic infarction (M4-M6 cortex): 3 Total score (0-10 with 10 being normal): 10 No evidence of an acute intracranial abnormality. These results were communicated to Dr. Cheral Marker at 11:32 amon 3/19/2024by text page via the [facility] messaging system. IMPRESSION: 1.  No evidence of an acute intracranial abnormality. 2. Parenchymal atrophy and chronic small vessel disease, as described. Electronically Signed  By: Kellie Simmering D.O.   On: 01/08/2023 11:32             LOS: 2 days       Emeterio Reeve, DO [facility] 01/11/2023, 11:52 AM    Dictation software may have been used to generate the above note. Typos may occur and escape review in typed/dictated notes. Please contact Dr Sheppard Coil directly for clarity if needed.  Staff may message me via secure chat in [place]  but this may not receive an immediate response,  please page me for urgent matters!  If 7PM-7AM, please contact night coverage [email or web address]

## 2023-01-12 DIAGNOSIS — Z515 Encounter for palliative care: Secondary | ICD-10-CM | POA: Diagnosis not present

## 2023-01-12 DIAGNOSIS — F03B11 Unspecified dementia, moderate, with agitation: Secondary | ICD-10-CM | POA: Diagnosis not present

## 2023-01-12 DIAGNOSIS — Z7189 Other specified counseling: Secondary | ICD-10-CM | POA: Diagnosis not present

## 2023-01-12 DIAGNOSIS — R4781 Slurred speech: Secondary | ICD-10-CM | POA: Diagnosis not present

## 2023-01-12 DIAGNOSIS — I639 Cerebral infarction, unspecified: Secondary | ICD-10-CM | POA: Diagnosis not present

## 2023-01-12 NOTE — TOC Transition Note (Signed)
Transition of Care [facility]) - CM/SW Discharge Note   Patient Details  Name: Bethany Hicks MRN: [id number] Date of Birth: 1927-12-04  [facility] of [facility]) CM/SW Contact:  Raina Mina, [place] Phone Number: 01/12/2023, 10:54 AM   Clinical Narrative:   CSW spoke with patients daughters Curt Bears and [name]. CSW explained that [facility] not able to take [facility] mother back. Patients daughters asked if CSW can complete a bed search for a long term care facility. CSW asked what would be the payment method as patient is not a candidate for rehab at this time. Patients daughters plan on applying for medicaid on Monday. CSW told patients daughters that paying privately would be 8,000-10,000 monthly. CSW also explained if paying privately or Medicaid isn't an option they could pay someone privately to provide care at home. Patients daughters prefer [facility] for LTC. CSW spoke with Seth Bake at [facility] and they currently have a long waiting list for a medicaid bed. Sharyn Lull with [facility] has not responded at this time.           Patient Goals and CMS Choice      Discharge Placement                         Discharge Plan and Services Additional resources added to the After Visit Summary for                                       Social Determinants of Health (SDOH) Interventions SDOH Screenings   Food Insecurity: Patient Unable To Answer (01/09/2023)  Tobacco Use: Medium Risk (01/09/2023)     Readmission Risk Interventions     No data to display

## 2023-01-12 NOTE — Progress Notes (Signed)
Manufacturing systems engineer Liaison Note  Follow up on new referral for hospice services at discharge.  [facility] is not able to accept patient back at their facility as she will require a higher level of care than they are able to provide.  Raina Mina, SW TOC has spoken to the family, who wish to apply for Medicaid.  Bed search underway.    No plans for discharge today.  Patient is minimally responsive.  Ate 1 bite of applesauce this morning and yesterday.  Patient maybe IPU appropriate if she continues to not eat and further decline in responsiveness.  Hospital Liaison Team will continue to follow through final disposition.  Dimas Aguas, RN Nurse Liaison [phone]

## 2023-01-12 NOTE — TOC Transition Note (Signed)
Transition of Care [facility]) - CM/SW Discharge Note   Patient Details  Name: Bethany Hicks MRN: [id number] Date of Birth: [birth year]  Transition of Care [facility]) CM/SW Contact:  Raina Mina, [place] Phone Number: 01/12/2023, 9:56 AM   Clinical Narrative:  CSW contacted [facility], [phone] who stated that Ed doesn't work on weekends. CSW was given the number to the supervisor Maudie Mercury, [phone] who stated they cannot take patient back in her current condition.           Patient Goals and CMS Choice      Discharge Placement                         Discharge Plan and Services Additional resources added to the After Visit Summary for                                       Social Determinants of Health (SDOH) Interventions SDOH Screenings   Food Insecurity: Patient Unable To Answer (01/09/2023)  Tobacco Use: Medium Risk (01/09/2023)     Readmission Risk Interventions     No data to display

## 2023-01-12 NOTE — Progress Notes (Signed)
Daily Progress Note   Patient Name: ASTER DAUER      Date: 01/12/2023 DOB: 12-27-1927  Age: 89 y.o. MRN#: [id number] Attending Physician: Emeterio Reeve, DO Primary Care Physician: Rusty Aus, MD Admit Date: 01/08/2023  Reason for Consultation/Follow-up: Establishing goals of care  HPI/Brief Hospital Review: 89 y.o. female with past medical history of mixed Alzheimer's and vascular dementia, major depressive disorder, HLD, B12 deficiency, hypothyroidism, and chronic hyperglycemia admitted on from [facility] 01/08/2023 with slurred speech.  MRI brain: 1.2 cm acute ischemic nonhemorrhagic infarct involving posterior limb of the left internal capsule. Underlying age-related cerebral atrophy with severe chronic microvascular ischemic disease.  Palliative Medicine consulted for assisting with goals of care discussions.  Unsuccessful NGT placement 3/21, family declined PEG tube placement, wanting to pursue hospice services  Transitioned to comfort measures 3/22-awaiting hospice disposition  Subjective: Extensive chart review has been completed prior to meeting patient including labs, vital signs, imaging, progress notes, orders, and available advanced directive documents from current and previous encounters.    Visited with Ms. Remines at her bedside. Granddaughter-in-law present at bed during time of visit. Ms. Schrager resting comfortably. Family reports she had a restful evening without acute events. Able to tolerate small amounts of applesauce and a few ice chips yesterday.  No complaints or evidence of acute pain or discomfort.   Noted periods of apnea during sleep, spoke with family about disease progression and natural trajectory towards end of life. No signs of distress. During visit,  Ms. Emick awakened, remains pleasantly confused, denies any needs. Symptom burden remains low.  TOC continues to work on disposition. Plan to d/c under hospice services.  Visited later, caregiver at bedside. Ms. Wynkoop less responsive during afternoon visit. Spoke with Kathy-daughter via telephone call-agreed with discontinuing IV fluids at this time in alignment with comfort care goals. Noted decreased urine output, decline in mentation and minimal PO intake. Symptom burden remains low at this time but will continue to monitor closely and adjust orders as needed.  Addressed and answered all questions and concerns. PMT to continue to follow for ongoing needs and support.   Objective:  Physical Exam Constitutional:      General: She is not in acute distress.    Appearance: She is ill-appearing.  Pulmonary:     Effort: Pulmonary effort  is normal. No respiratory distress.  Skin:    General: Skin is warm and dry.  Neurological:     Motor: Weakness present.             Vital Signs: BP (!) 154/75 (BP Location: Left Arm)   Pulse 86   Temp 98.3 F (36.8 C) (Axillary)   Resp 15   Ht 5\' 11"  (1.803 m)   Wt 75.8 kg   SpO2 95%   BMI 23.29 kg/m  SpO2: SpO2: 95 % O2 Device: O2 Device: Room Air O2 Flow Rate:     Palliative Care Assessment & Plan   Assessment/Recommendation/Plan  DNR-Comfort Measures Discontinued continuous IV infusion Monitor and adjust orders for pain/discomfort, SHOB or increased WOB-recommend starting SL Roxanol given profound dysphagia PMT to continue to follow for ongoing needs and support   Care plan was discussed with nursing staff, [facility] liaison and primary team.  Thank you for allowing the Palliative Medicine Team to assist in the care of this patient.  Total time:  35 minutes  Time spent includes: Detailed review of medical records (vital signs), medically appropriate exam (mental status, respiratory, cardiac, skin), discussed with treatment team,  counseling and educating patient, family and staff, documenting clinical information, medication management and coordination of care.  Theodoro Grist, DNP, AGNP-C Palliative Medicine   Please contact Palliative Medicine Team phone at [phone] for questions and concerns.

## 2023-01-12 NOTE — Progress Notes (Signed)
PROGRESS NOTE    Bethany Hicks   [id number] DOB: 20-May-1928  DOA: 01/08/2023 Date of Service: 01/12/23 PCP: Rusty Aus, MD     Brief Narrative / Hospital Course:  Bethany Hicks is a 89 y.o. female with medical history significant of hypertension, hypothyroidism, dementia presenting with CVA.  Patient noted with slurred speech at facility around 930 this morning.  This is well off baseline per report.  Per report, patient was eating breakfast like normal prior to this.  Family was called to which patient's daughter went to evaluate the patient with patient noted slurred speech.  Patient was then taken to the ER by car with improvement in symptoms upon arrival.  03/19: Code stroke was called upon arrival.  Initial imaging grossly negative.  Of note patient also had some mild confusion within the past month including urine and labs that were done at primary care's office.  No reports of fevers or chills.  Reports no nausea or vomiting.  Reports remote history of CVA diagnosed years ago as an incidental finding. Presented to the ER afebrile, systolic pressures in the 180s to 190s, satting well on room air.  White count 6.6, hemoglobin 12.3, potassium 3.3, creatinine 0.8.  Initial code stroke CT head grossly stable.  Upon evaluation by admitting hospitalist, patient had new onset left-sided facial droop to which a repeat code stroke was called with neurology reevaluation. formal CVA evaluation including MRI of the brain, CTA of the head neck, TTE, risk stratification labs. On baby aspirin chronically. S/p plavix load in ER. PT agitation precluded timely CTA evaluation.  03/20: CTA H/N neg LVO, moderate atherosclerotic disease. MRI brain 1.2 cm acute ischemic nonhemorrhagic infarct involving the posterior limb of the left internal capsule. Underlying age-related cerebral atrophy with severe chronic microvascular ischemic disease. PT/OT eval pending. Long discussion today w/ family at bedside re: poor  prognosis if patient is not recovering her alertness / po intake. I am concerned for vascular dementia as cause of sudden change, in addition to CVA effects, and if this is the case I am concerned she is unlikely to recover. Will request additional input from neurology team re: prognostication. Discussed code status with daughter - given how patient is looking now, I hesitate to offer feeding tube of any kind pending neurology opinion, and I hesitate to offer CPR/intubation as I do not feel she would recover from resuscitation measures. Will leave as FULL CODE for now but daughter would like to speak w/ family about this further and we will revisit the topic tomorrow.  03/21: see IPAL note - pt DNR, palliative consult. Repeating CT head today - no new stroke. IR to place NG for nutrition --> unsuccessful. Palliative care discussion w/ family following unsuccessful NG placement, they are opting for hospice since NG unsuccessful and they do not wish to pursue PEG tube. TOC aware, hospice to reach out to daughter(s) tomorrow  03/22: stable. Pt goes back and forth alert/somnolent, difficult to understand speech but seems to be asking for water/food. Comfort measures and avoid po meds altogether, can eat as desired  03/23: stable, TOC working on placement     Consultants:  Neurology   Procedures: None       ASSESSMENT & PLAN:   Principal Problem:   CVA (cerebral vascular accident) ([place]) Active Problems:   Hypothyroidism   HYPERCHOLESTEROLEMIA, PURE   Essential hypertension   GERD   Dementia ([place])  CVA (cerebral vascular accident) Hospice consulted, not candidate for inpatient  hospice pending eval/recs for hospice care at SNF  ASA/Plavix as able to prevent worsening/recurrent CVA   Dysphagia as effect of CVA vs advancing dementia  NPO --> comfort feeds as desired NG per IR attempted, not able to pass NG SLP following  Minimize po meds   Dementia D/c namenda, exelon d/t NPO    GERD D/c po PPI    Essential hypertension Unable to take po meds  Hydralazine IV scheduled Labetalol IV prn severe HTN   HLD D/c statin d/t NPO  Hypothyroidism Cont synthroid if/when able     DVT prophylaxis: lovenox  Pertinent IV fluids/nutrition: npo / comfort feeds  Central lines / invasive devices: none   Code Status: DNR   Current Admission Status: inpatient   TOC needs / Dispo plan: pend SNF ability to perform hospice services and take her back there, remains inpatient for now on comfort measures  Barriers to discharge / significant pending items: see above re: dispo             Subjective / Brief ROS:  Patient alert, aphasic   Family Communication: caregiver at bedside.     Objective Findings:  Vitals:   01/11/23 2108 01/12/23 0537 01/12/23 0732 01/12/23 0931  BP: (!) 143/82 (!) 158/89 (!) 154/75 (!) 157/68  Pulse: 85 83 86 83  Resp: 20  15 18   Temp: 98.6 F (37 C)  98.3 F (36.8 C) 97.7 F (36.5 C)  TempSrc:   Axillary Oral  SpO2: 97%  95% 94%  Weight:      Height:        Intake/Output Summary (Last 24 hours) at 01/12/2023 1112 Last data filed at 01/12/2023 1059 Gross per 24 hour  Intake --  Output 450 ml  Net -450 ml   Filed Weights   01/08/23 1146  Weight: 75.8 kg    Examination:  Physical Exam Constitutional:      General: She is not in acute distress.    Appearance: She is ill-appearing.  HENT:     Mouth/Throat:     Mouth: Mucous membranes are dry.  Cardiovascular:     Rate and Rhythm: Normal rate and regular rhythm.  Pulmonary:     Effort: Pulmonary effort is normal.     Breath sounds: Normal breath sounds.  Musculoskeletal:     Right lower leg: No edema.     Left lower leg: No edema.  Skin:    General: Skin is warm and dry.  Neurological:     Mental Status: She is alert.     Motor: Weakness present.     Comments: Aphasic - very slurred speech is unintelligible           Scheduled Medications:    aspirin EC  325 mg Oral Once   aspirin EC  81 mg Oral Daily   clopidogrel  75 mg Oral Daily   enoxaparin (LOVENOX) injection  40 mg Subcutaneous Q24H   hydrALAZINE  10 mg Intravenous Q6H    Continuous Infusions:  dextrose 5% lactated ringers 75 mL/hr at 01/11/23 1135    PRN Medications:  antiseptic oral rinse, labetalol, polyvinyl alcohol  Antimicrobials from admission:  Anti-infectives (From admission, onward)    None           Data Reviewed:  I have personally reviewed the following...  CBC: Recent Labs  Lab 01/08/23 1112 01/09/23 0820  WBC 6.6 7.7  NEUTROABS 4.2  --   HGB 12.3 12.5  HCT 39.6 39.9  MCV  88.2 86.7  PLT 227 0000000   Basic Metabolic Panel: Recent Labs  Lab 01/08/23 1112 01/09/23 0820  NA 141 139  K 3.3* 3.5  CL 102 103  CO2 30 28  GLUCOSE 81 92  BUN 13 10  CREATININE 0.78 0.78  CALCIUM 8.9 8.8*   GFR: Estimated Creatinine Clearance: 50.2 mL/min (by C-G formula based on SCr of 0.78 mg/dL). Liver Function Tests: Recent Labs  Lab 01/08/23 1112 01/09/23 0820  AST 15 19  ALT 10 9  ALKPHOS 82 77  BILITOT 0.5 0.9  PROT 6.9 6.9  ALBUMIN 3.2* 3.3*   No results for input(s): "LIPASE", "AMYLASE" in the last 168 hours. No results for input(s): "AMMONIA" in the last 168 hours. Coagulation Profile: Recent Labs  Lab 01/08/23 1112  INR 1.0   Cardiac Enzymes: No results for input(s): "CKTOTAL", "CKMB", "CKMBINDEX", "TROPONINI" in the last 168 hours. BNP (last 3 results) No results for input(s): "PROBNP" in the last 8760 hours. HbA1C: No results for input(s): "HGBA1C" in the last 72 hours.  CBG: Recent Labs  Lab 01/08/23 1109 01/08/23 2033 01/09/23 0732 01/09/23 0908 01/09/23 2042  GLUCAP 81 94 80 79 113*   Lipid Profile: No results for input(s): "CHOL", "HDL", "LDLCALC", "TRIG", "CHOLHDL", "LDLDIRECT" in the last 72 hours.  Thyroid Function Tests: No results for input(s): "TSH", "T4TOTAL", "FREET4", "T3FREE", "THYROIDAB" in  the last 72 hours. Anemia Panel: No results for input(s): "VITAMINB12", "FOLATE", "FERRITIN", "TIBC", "IRON", "RETICCTPCT" in the last 72 hours. Most Recent Urinalysis On File:     Component Value Date/Time   COLORURINE STRAW (A) 01/09/2023 0922   APPEARANCEUR CLEAR (A) 01/09/2023 0922   APPEARANCEUR Clear 05/04/2012 0613   LABSPEC 1.015 01/09/2023 0922   LABSPEC 1.014 05/04/2012 0613   PHURINE 8.0 01/09/2023 0922   GLUCOSEU NEGATIVE 01/09/2023 0922   GLUCOSEU Negative 05/04/2012 0613   HGBUR NEGATIVE 01/09/2023 0922   BILIRUBINUR NEGATIVE 01/09/2023 0922   BILIRUBINUR Negative 05/04/2012 0613   KETONESUR NEGATIVE 01/09/2023 0922   PROTEINUR NEGATIVE 01/09/2023 0922   NITRITE NEGATIVE 01/09/2023 0922   LEUKOCYTESUR NEGATIVE 01/09/2023 0922   LEUKOCYTESUR Negative 05/04/2012 0613   Sepsis Labs: @LABRCNTIP (procalcitonin:4,lacticidven:4) Microbiology: No results found for this or any previous visit (from the past 240 hour(s)).    Radiology Studies last 3 days: DG [place] Dys Tube Plc W/Fl W/Rad  Result Date: 01/10/2023 CLINICAL DATA:  Provided history: Encounter for nasogastric tube placement. EXAM: NASO G TUBE PLACEMENT WITH FL AND WITH RAD CONTRAST:  None. FLUOROSCOPY: Fluoroscopy Time:  6 seconds Radiation Exposure Index (if provided by the fluoroscopic device): 0.5 mGy. Number of Acquired Spot Images: None. COMPARISON:  None. FINDINGS: Multiple attempts were made to place a nasogastric tube via the right and left nostrils. However, during each attempt significant resistance was encountered, and the tube could not be advanced beyond the level of the nasal passages. IMPRESSION: Unsuccessful nasogastric tube placement. The tube could not be advanced beyond the nasal passages. Electronically Signed   By: Kellie Simmering D.O.   On: 01/10/2023 15:20   CT HEAD WO CONTRAST (5MM)  Result Date: 01/10/2023 CLINICAL DATA:  Stroke, follow-up EXAM: CT HEAD WITHOUT CONTRAST TECHNIQUE: Contiguous axial  images were obtained from the base of the skull through the vertex without intravenous contrast. RADIATION DOSE REDUCTION: This exam was performed according to the departmental dose-optimization program which includes automated exposure control, adjustment of the mA and/or kV according to patient size and/or use of iterative reconstruction technique. COMPARISON:  CT head  01/08/2023, MRI head 01/08/2023 FINDINGS: Brain: Increased hypodensity in the left internal capsule, which correlates with the acute infarct noted on the 01/08/2023 MRI. No other new hypodensity to suggest additional acute infarct. No evidence of acute hemorrhage, mass, mass effect, or midline shift. No hydrocephalus or extra-axial fluid collection. Periventricular white matter changes, likely the sequela of chronic small vessel ischemic disease. Vascular: No hyperdense vessel. Skull: Negative for fracture or focal lesion. Sinuses/Orbits: No acute finding. Other: Fluid in the right mastoid air cells. IMPRESSION: 1. Increased hypodensity in the left internal capsule, which correlates with the acute infarct noted on the 01/08/2023 MRI. No other new hypodensity to suggest additional acute infarct. 2. No acute intracranial hemorrhage. Electronically Signed   By: Merilyn Baba M.D.   On: 01/10/2023 13:39   ECHOCARDIOGRAM COMPLETE  Result Date: 01/09/2023    ECHOCARDIOGRAM REPORT   Patient Name:   Bethany Hicks    Date of Exam: 01/08/2023 Medical Rec #:  [id number]     Height:       71.0 in Accession #:    [id number]    Weight:       167.0 lb Date of Birth:  [birth year]     BSA:          1.953 m Patient Age:    63 years      BP:           192/95 mmHg Patient Gender: F             HR:           60 bpm. Exam Location:  [address] Procedure: 2D Echo, Cardiac Doppler and Color Doppler Indications:     G45.9 TIA  History:         Patient has no prior history of Echocardiogram examinations.                  Risk Factors:Hypertension. Hypothyroidism.   Sonographer:     Cresenciano Lick RDCS Referring Phys:  [place] Diagnosing Phys: Nelva Bush MD IMPRESSIONS  1. Left ventricular ejection fraction, by estimation, is >55%. The left ventricle has normal function. Left ventricular endocardial border not optimally defined to evaluate regional wall motion. There is mild left ventricular hypertrophy. Left ventricular diastolic parameters are consistent with Grade I diastolic dysfunction (impaired relaxation).  2. Right ventricular systolic function is normal. The right ventricular size is normal. There is normal pulmonary artery systolic pressure.  3. The mitral valve is normal in structure. Trivial mitral valve regurgitation. No evidence of mitral stenosis.  4. The aortic valve is tricuspid. There is mild thickening of the aortic valve. Aortic valve regurgitation is mild. Aortic valve sclerosis is present, with no evidence of aortic valve stenosis.  5. There is mild dilatation of the ascending aorta, measuring 37 mm.  6. The inferior vena cava is normal in size with greater than 50% respiratory variability, suggesting right atrial pressure of 3 mmHg. FINDINGS  Left Ventricle: Left ventricular ejection fraction, by estimation, is >55%. The left ventricle has normal function. Left ventricular endocardial border not optimally defined to evaluate regional wall motion. The left ventricular internal cavity size was  normal in size. There is mild left ventricular hypertrophy. Left ventricular diastolic parameters are consistent with Grade I diastolic dysfunction (impaired relaxation). Right Ventricle: The right ventricular size is normal. No increase in right ventricular wall thickness. Right ventricular systolic function is normal. There is normal pulmonary artery systolic pressure. The tricuspid regurgitant velocity  is 1.90 m/s, and  with an assumed right atrial pressure of 3 mmHg, the estimated right ventricular systolic pressure is 0000000 mmHg. Left  Atrium: Left atrial size was normal in size. Right Atrium: Right atrial size was normal in size. Pericardium: There is no evidence of pericardial effusion. Mitral Valve: The mitral valve is normal in structure. Trivial mitral valve regurgitation. No evidence of mitral valve stenosis. Tricuspid Valve: The tricuspid valve is grossly normal. Tricuspid valve regurgitation is mild. Aortic Valve: The aortic valve is tricuspid. There is mild thickening of the aortic valve. Aortic valve regurgitation is mild. Aortic valve sclerosis is present, with no evidence of aortic valve stenosis. Pulmonic Valve: The pulmonic valve was not well visualized. Pulmonic valve regurgitation is not visualized. No evidence of pulmonic stenosis. Aorta: The aortic root is normal in size and structure. There is mild dilatation of the ascending aorta, measuring 37 mm. Pulmonary Artery: The pulmonary artery is of normal size. Venous: The inferior vena cava is normal in size with greater than 50% respiratory variability, suggesting right atrial pressure of 3 mmHg. IAS/Shunts: No atrial level shunt detected by color flow Doppler.  LEFT VENTRICLE PLAX 2D LVIDd:         3.70 cm   Diastology LVIDs:         2.00 cm   LV e' medial:    4.90 cm/s LV PW:         1.10 cm   LV E/e' medial:  11.0 LV IVS:        1.10 cm   LV e' lateral:   5.33 cm/s LVOT diam:     1.90 cm   LV E/e' lateral: 10.1 LV SV:         56 LV SV Index:   29 LVOT Area:     2.84 cm  RIGHT VENTRICLE             IVC RV Basal diam:  3.70 cm     IVC diam: 1.30 cm RV S prime:     17.05 cm/s TAPSE (M-mode): 2.4 cm LEFT ATRIUM             Index        RIGHT ATRIUM           Index LA diam:        3.70 cm 1.89 cm/m   RA Area:     12.00 cm LA Vol (A2C):   47.3 ml 24.22 ml/m  RA Volume:   32.50 ml  16.64 ml/m LA Vol (A4C):   26.2 ml 13.42 ml/m LA Biplane Vol: 35.3 ml 18.07 ml/m  AORTIC VALVE LVOT Vmax:   84.10 cm/s LVOT Vmean:  55.050 cm/s LVOT VTI:    0.198 m  AORTA Ao Root diam: 3.60 cm Ao Asc  diam:  3.70 cm MITRAL VALVE               TRICUSPID VALVE MV Area (PHT): 2.29 cm    TR Peak grad:   14.4 mmHg MV Decel Time: 331 msec    TR Vmax:        190.00 cm/s MV E velocity: 53.75 cm/s MV A velocity: 94.60 cm/s  SHUNTS MV E/A ratio:  0.57        Systemic VTI:  0.20 m                            Systemic Diam: 1.90 cm Nelva Bush MD  Electronically signed by Nelva Bush MD Signature Date/Time: 01/09/2023/6:52:46 AM    Final    MR BRAIN WO CONTRAST  Result Date: 01/08/2023 CLINICAL DATA:  Initial evaluation for neuro deficit, stroke suspected. EXAM: MRI HEAD WITHOUT CONTRAST TECHNIQUE: Multiplanar, multiecho pulse sequences of the brain and surrounding structures were obtained without intravenous contrast. COMPARISON:  Prior CTs from earlier the same day. FINDINGS: Brain: Examination degraded by motion artifact. Generalized age-related cerebral atrophy. Extensive patchy and confluent T2/FLAIR hyperintensity involving the periventricular and deep white matter both cerebral hemispheres as well as the pons, consistent with chronic small vessel ischemic disease, severe in nature. Few tiny remote cerebellar infarcts noted. 1.2 cm focus of restricted diffusion involving the posterior limb of the left internal capsule, consistent with a acute ischemic infarct (series 5, image 24). No associated hemorrhage or mass effect. No other evidence for acute or subacute ischemia. Gray-white matter differentiation otherwise maintained. No acute intracranial hemorrhage. Few small chronic micro hemorrhages noted, likely small vessel related. No mass lesion, midline shift or mass effect. No hydrocephalus or extra-axial fluid collection. Pituitary gland and suprasellar region within normal limits. Vascular: Major intracranial vascular flow voids are maintained. Skull and upper cervical spine: Craniocervical junction within normal limits. Bone marrow signal intensity normal. No scalp soft tissue abnormality. Sinuses/Orbits:  Prior bilateral ocular lens replacement. Paranasal sinuses are largely clear. Small bilateral mastoid effusions, of doubtful significance. Visualized nasopharynx unremarkable. Other: None. IMPRESSION: 1. 1.2 cm acute ischemic nonhemorrhagic infarct involving the posterior limb of the left internal capsule. 2. Underlying age-related cerebral atrophy with severe chronic microvascular ischemic disease. Electronically Signed   By: Jeannine Boga M.D.   On: 01/08/2023 23:57   CT ANGIO HEAD NECK W WO CM  Result Date: 01/08/2023 CLINICAL DATA:  Initial evaluation for neuro deficit, stroke suspected. EXAM: CT ANGIOGRAPHY HEAD AND NECK TECHNIQUE: Multidetector CT imaging of the head and neck was performed using the standard protocol during bolus administration of intravenous contrast. Multiplanar CT image reconstructions and MIPs were obtained to evaluate the vascular anatomy. Carotid stenosis measurements (when applicable) are obtained utilizing NASCET criteria, using the distal internal carotid diameter as the denominator. RADIATION DOSE REDUCTION: This exam was performed according to the departmental dose-optimization program which includes automated exposure control, adjustment of the mA and/or kV according to patient size and/or use of iterative reconstruction technique. CONTRAST:  75 mL of Omnipaque 350. COMPARISON:  Prior CT from earlier the same day. FINDINGS: CTA NECK FINDINGS Aortic arch: Visualized aortic arch normal in caliber with standard 3 vessel morphology. Mild atheromatous change about the arch itself. No stenosis about the origin the great vessels. Right carotid system: Right common and internal carotid arteries are patent without dissection. Minimal atheromatous change about the right carotid bulb without hemodynamically significant stenosis. Left carotid system: Left common and internal carotid arteries are patent without dissection. Minimal atheromatous change about the left carotid bulb  without hemodynamically significant stenosis. Vertebral arteries: Both vertebral arteries arise from the subclavian arteries. No proximal subclavian artery stenosis. Both vertebral arteries widely patent without stenosis, dissection or occlusion. Skeleton: No discrete or worrisome osseous lesions. Mild spondylosis at C4-5 through C6-7. Other neck: No other acute soft tissue abnormality within the neck. Upper chest: Few small pulmonary nodules measuring up to 5 mm are seen clustered within the peripheral left upper lobe (series 4, image 172), indeterminate. Visualized upper chest demonstrates no other acute finding. Review of the MIP images confirms the above findings CTA HEAD FINDINGS Anterior circulation: Mild atheromatous  change within the carotid siphons without stenosis. 2 mm outpouching extending inferiorly from the supraclinoid left ICA felt to be most consistent with a small vascular infundibulum. Left A1 segment widely patent. Right A1 hypoplastic and/or absent. Normal anterior communicating artery complex. Atheromatous irregularity throughout the anterior cerebral arteries without proximal high-grade stenosis. Atheromatous change seen within both M1 segments as well with no more than mild multifocal narrowing. No proximal MCA branch occlusion or high-grade stenosis. Distal MCA branches well perfused, although demonstrates small vessel atheromatous irregularity. Posterior circulation: Both V4 segments patent without stenosis. Left PICA patent. Right PICA origin not well seen. Basilar mildly irregular but patent without high-grade stenosis. Superior cerebellar arteries patent bilaterally. Left PCA supplied via the basilar. Fetal type origin of the right PCA. Both PCAs patent to their distal aspects without significant stenosis. Venous sinuses: Grossly patent allowing for timing the contrast bolus. Anatomic variants: As above.  No aneurysm. Review of the MIP images confirms the above findings IMPRESSION: 1.  Negative CTA for large vessel occlusion or other emergent finding. 2. Moderate intracranial atherosclerotic disease without proximal high-grade or correctable stenosis. 3. Mild atheromatous change about the carotid bifurcations without stenosis. No hemodynamically significant stenosis within the neck. 4. Few small pulmonary nodules measuring up to 5 mm clustered within the peripheral left upper lobe, indeterminate. Per Fleischner Society Guidelines, if patient is low risk for malignancy, no routine follow-up imaging is recommended. If patient is high risk for malignancy, a non-contrast Chest CT at 12 months is optional. If performed and the nodule is stable at 12 months, no further follow-up is recommended. These guidelines do not apply to immunocompromised patients and patients with cancer. Follow up in patients with significant comorbidities as clinically warranted. For lung cancer screening, adhere to Lung-RADS guidelines. Reference: Radiology. 2017; 284(1):228-43. Electronically Signed   By: Jeannine Boga M.D.   On: 01/08/2023 20:12   CT HEAD CODE STROKE WO CONTRAST  Result Date: 01/08/2023 CLINICAL DATA:  Code stroke. Neuro deficit, acute, stroke suspected. Acute onset slurred speech. History of dementia. History of prior stroke with foot drop. EXAM: CT HEAD WITHOUT CONTRAST TECHNIQUE: Contiguous axial images were obtained from the base of the skull through the vertex without intravenous contrast. RADIATION DOSE REDUCTION: This exam was performed according to the departmental dose-optimization program which includes automated exposure control, adjustment of the mA and/or kV according to patient size and/or use of iterative reconstruction technique. COMPARISON:  Head CT 04/12/2022. FINDINGS: Brain: Mild generalized parenchymal atrophy. Patchy and ill-defined hypoattenuation within the cerebral white matter, nonspecific but compatible with advanced chronic small vessel ischemic disease. Chronic small  vessel ischemic changes are also present within the bilateral deep gray nuclei. There is no acute intracranial hemorrhage. No demarcated cortical infarct. No extra-axial fluid collection. No evidence of an intracranial mass. No midline shift. Vascular: No hyperdense vessel.  Atherosclerotic calcifications. Skull: No fracture or aggressive osseous lesion. Sinuses/Orbits: No mass or acute finding within the imaged orbits. No significant paranasal sinus disease at the imaged levels. ASPECTS [facility] Stroke Program Early CT Score) - Ganglionic level infarction (caudate, lentiform nuclei, internal capsule, insula, M1-M3 cortex): 7 - Supraganglionic infarction (M4-M6 cortex): 3 Total score (0-10 with 10 being normal): 10 No evidence of an acute intracranial abnormality. These results were communicated to Dr. Cheral Marker at 11:32 amon 3/19/2024by text page via the [facility] messaging system. IMPRESSION: 1.  No evidence of an acute intracranial abnormality. 2. Parenchymal atrophy and chronic small vessel disease, as described. Electronically Signed   By:  Kellie Simmering D.O.   On: 01/08/2023 11:32             LOS: 3 days       Emeterio Reeve, DO [facility] 01/12/2023, 11:12 AM    Dictation software may have been used to generate the above note. Typos may occur and escape review in typed/dictated notes. Please contact Dr Sheppard Coil directly for clarity if needed.  Staff may message me via secure chat in [place]  but this may not receive an immediate response,  please page me for urgent matters!  If 7PM-7AM, please contact night coverage [email or web address]

## 2023-01-13 DIAGNOSIS — Z7189 Other specified counseling: Secondary | ICD-10-CM | POA: Diagnosis not present

## 2023-01-13 DIAGNOSIS — Z515 Encounter for palliative care: Secondary | ICD-10-CM | POA: Diagnosis not present

## 2023-01-13 DIAGNOSIS — I639 Cerebral infarction, unspecified: Secondary | ICD-10-CM | POA: Diagnosis not present

## 2023-01-13 DIAGNOSIS — R4781 Slurred speech: Secondary | ICD-10-CM | POA: Diagnosis not present

## 2023-01-13 NOTE — Progress Notes (Signed)
PROGRESS NOTE    CHANDA MINKUS   [id number] DOB: 1928-09-19  DOA: 01/08/2023 Date of Service: 01/13/23 PCP: Rusty Aus, MD     Brief Narrative / Hospital Course:  Bethany Hicks is a 89 y.o. female with medical history significant of hypertension, hypothyroidism, dementia presenting with CVA.  Patient noted with slurred speech at facility around 930 this morning.  This is well off baseline per report.  Per report, patient was eating breakfast like normal prior to this.  Family was called to which patient's daughter went to evaluate the patient with patient noted slurred speech.  Patient was then taken to the ER by car with improvement in symptoms upon arrival.  03/19: Code stroke was called upon arrival.  Initial imaging grossly negative.  Of note patient also had some mild confusion within the past month including urine and labs that were done at primary care's office.  No reports of fevers or chills.  Reports no nausea or vomiting.  Reports remote history of CVA diagnosed years ago as an incidental finding. Presented to the ER afebrile, systolic pressures in the 180s to 190s, satting well on room air.  White count 6.6, hemoglobin 12.3, potassium 3.3, creatinine 0.8.  Initial code stroke CT head grossly stable.  Upon evaluation by admitting hospitalist, patient had new onset left-sided facial droop to which a repeat code stroke was called with neurology reevaluation. formal CVA evaluation including MRI of the brain, CTA of the head neck, TTE, risk stratification labs. On baby aspirin chronically. S/p plavix load in ER. PT agitation precluded timely CTA evaluation.  03/20: CTA H/N neg LVO, moderate atherosclerotic disease. MRI brain 1.2 cm acute ischemic nonhemorrhagic infarct involving the posterior limb of the left internal capsule. Underlying age-related cerebral atrophy with severe chronic microvascular ischemic disease. PT/OT eval pending. Long discussion today w/ family at bedside re: poor  prognosis if patient is not recovering her alertness / po intake. I am concerned for vascular dementia as cause of sudden change, in addition to CVA effects, and if this is the case I am concerned she is unlikely to recover. Will request additional input from neurology team re: prognostication. Discussed code status with daughter - given how patient is looking now, I hesitate to offer feeding tube of any kind pending neurology opinion, and I hesitate to offer CPR/intubation as I do not feel she would recover from resuscitation measures. Will leave as FULL CODE for now but daughter would like to speak w/ family about this further and we will revisit the topic tomorrow.  03/21: see IPAL note - pt DNR, palliative consult. Repeating CT head today - no new stroke. IR to place NG for nutrition --> unsuccessful. Palliative care discussion w/ family following unsuccessful NG placement, they are opting for hospice since NG unsuccessful and they do not wish to pursue PEG tube. TOC aware, hospice to reach out to daughter(s) tomorrow  03/22: stable. Pt goes back and forth alert/somnolent, difficult to understand speech but seems to be asking for water/food. Comfort measures and avoid po meds altogether, can eat as desired  03/23-03/24: stable, TOC working on placement. Not qualifying for inpatient hospice. [facility] cannot take her back.     Consultants:  Neurology   Procedures: None       ASSESSMENT & PLAN:   Principal Problem:   CVA (cerebral vascular accident) ([place]) Active Problems:   Hypothyroidism   HYPERCHOLESTEROLEMIA, PURE   Essential hypertension   GERD   Dementia ([place])  CVA (cerebral vascular accident) Hospice consulted, not candidate for inpatient hospice pending eval/recs for hospice care at SNF    Dysphagia as effect of CVA vs advancing dementia  NPO --> comfort feeds as desired NG per IR attempted, not able to pass NG SLP following  Minimize po meds   Dementia D/c  namenda, exelon d/t NPO   GERD D/c po PPI    Essential hypertension Unable to take po meds  Hydralazine IV scheduled - control HTN to avoid CP/HA Labetalol IV prn severe HTN   HLD D/c statin d/t NPO on comfort measures   Hypothyroidism D/c thyroid meds d/t NPO on comfort measures     DVT prophylaxis: lovenox  Pertinent IV fluids/nutrition: npo / comfort feeds  Central lines / invasive devices: none   Code Status: DNR   Current Admission Status: inpatient   TOC needs / Dispo plan: pend SNF/LTC vs hospice if deteriorates  Barriers to discharge / significant pending items: see above re: dispo             Subjective / Brief ROS:  Patient somnolent, aphasic   Family Communication: daughter at bedside. All questions answered    Objective Findings:  Vitals:   01/12/23 0931 01/12/23 1215 01/12/23 1721 01/13/23 0817  BP: (!) 157/68 (!) 161/81 (!) 160/73 (!) 185/85  Pulse: 83 79 92 78  Resp: 16   16  Temp: 97.7 F (36.5 C)   (!) 97.3 F (36.3 C)  TempSrc: Oral     SpO2: 94%   95%  Weight:      Height:       No intake or output data in the 24 hours ending 01/13/23 1426  Filed Weights   01/08/23 1146  Weight: 75.8 kg    Examination:  Physical Exam Constitutional:      General: She is not in acute distress.    Appearance: She is ill-appearing.  HENT:     Mouth/Throat:     Mouth: Mucous membranes are dry.  Pulmonary:     Effort: Pulmonary effort is normal.  Musculoskeletal:     Right lower leg: No edema.     Left lower leg: No edema.  Skin:    General: Skin is warm and dry.  Neurological:     Mental Status: She is alert.          Scheduled Medications:   enoxaparin (LOVENOX) injection  40 mg Subcutaneous Q24H   hydrALAZINE  10 mg Intravenous Q6H    Continuous Infusions:    PRN Medications:  antiseptic oral rinse, labetalol, polyvinyl alcohol  Antimicrobials from admission:  Anti-infectives (From admission, onward)    None            Data Reviewed:  I have personally reviewed the following...  CBC: Recent Labs  Lab 01/08/23 1112 01/09/23 0820  WBC 6.6 7.7  NEUTROABS 4.2  --   HGB 12.3 12.5  HCT 39.6 39.9  MCV 88.2 86.7  PLT 227 0000000   Basic Metabolic Panel: Recent Labs  Lab 01/08/23 1112 01/09/23 0820  NA 141 139  K 3.3* 3.5  CL 102 103  CO2 30 28  GLUCOSE 81 92  BUN 13 10  CREATININE 0.78 0.78  CALCIUM 8.9 8.8*   GFR: Estimated Creatinine Clearance: 50.2 mL/min (by C-G formula based on SCr of 0.78 mg/dL). Liver Function Tests: Recent Labs  Lab 01/08/23 1112 01/09/23 0820  AST 15 19  ALT 10 9  ALKPHOS 82 77  BILITOT 0.5  0.9  PROT 6.9 6.9  ALBUMIN 3.2* 3.3*   No results for input(s): "LIPASE", "AMYLASE" in the last 168 hours. No results for input(s): "AMMONIA" in the last 168 hours. Coagulation Profile: Recent Labs  Lab 01/08/23 1112  INR 1.0   Cardiac Enzymes: No results for input(s): "CKTOTAL", "CKMB", "CKMBINDEX", "TROPONINI" in the last 168 hours. BNP (last 3 results) No results for input(s): "PROBNP" in the last 8760 hours. HbA1C: No results for input(s): "HGBA1C" in the last 72 hours.  CBG: Recent Labs  Lab 01/08/23 1109 01/08/23 2033 01/09/23 0732 01/09/23 0908 01/09/23 2042  GLUCAP 81 94 80 79 113*   Lipid Profile: No results for input(s): "CHOL", "HDL", "LDLCALC", "TRIG", "CHOLHDL", "LDLDIRECT" in the last 72 hours.  Thyroid Function Tests: No results for input(s): "TSH", "T4TOTAL", "FREET4", "T3FREE", "THYROIDAB" in the last 72 hours. Anemia Panel: No results for input(s): "VITAMINB12", "FOLATE", "FERRITIN", "TIBC", "IRON", "RETICCTPCT" in the last 72 hours. Most Recent Urinalysis On File:     Component Value Date/Time   COLORURINE STRAW (A) 01/09/2023 0922   APPEARANCEUR CLEAR (A) 01/09/2023 0922   APPEARANCEUR Clear 05/04/2012 0613   LABSPEC 1.015 01/09/2023 0922   LABSPEC 1.014 05/04/2012 0613   PHURINE 8.0 01/09/2023 0922   GLUCOSEU  NEGATIVE 01/09/2023 0922   GLUCOSEU Negative 05/04/2012 0613   HGBUR NEGATIVE 01/09/2023 0922   BILIRUBINUR NEGATIVE 01/09/2023 0922   BILIRUBINUR Negative 05/04/2012 0613   KETONESUR NEGATIVE 01/09/2023 0922   PROTEINUR NEGATIVE 01/09/2023 0922   NITRITE NEGATIVE 01/09/2023 0922   LEUKOCYTESUR NEGATIVE 01/09/2023 0922   LEUKOCYTESUR Negative 05/04/2012 0613   Sepsis Labs: @LABRCNTIP (procalcitonin:4,lacticidven:4) Microbiology: No results found for this or any previous visit (from the past 240 hour(s)).    Radiology Studies last 3 days: DG L[place] Dys Tube Plc W/Fl W/Rad  Result Date: 01/10/2023 CLINICAL DATA:  Provided history: Encounter for nasogastric tube placement. EXAM: NASO G TUBE PLACEMENT WITH FL AND WITH RAD CONTRAST:  None. FLUOROSCOPY: Fluoroscopy Time:  6 seconds Radiation Exposure Index (if provided by the fluoroscopic device): 0.5 mGy. Number of Acquired Spot Images: None. COMPARISON:  None. FINDINGS: Multiple attempts were made to place a nasogastric tube via the right and left nostrils. However, during each attempt significant resistance was encountered, and the tube could not be advanced beyond the level of the nasal passages. IMPRESSION: Unsuccessful nasogastric tube placement. The tube could not be advanced beyond the nasal passages. Electronically Signed   By: Kellie Simmering D.O.   On: 01/10/2023 15:20   CT HEAD WO CONTRAST (5MM)  Result Date: 01/10/2023 CLINICAL DATA:  Stroke, follow-up EXAM: CT HEAD WITHOUT CONTRAST TECHNIQUE: Contiguous axial images were obtained from the base of the skull through the vertex without intravenous contrast. RADIATION DOSE REDUCTION: This exam was performed according to the departmental dose-optimization program which includes automated exposure control, adjustment of the mA and/or kV according to patient size and/or use of iterative reconstruction technique. COMPARISON:  CT head 01/08/2023, MRI head 01/08/2023 FINDINGS: Brain: Increased  hypodensity in the left internal capsule, which correlates with the acute infarct noted on the 01/08/2023 MRI. No other new hypodensity to suggest additional acute infarct. No evidence of acute hemorrhage, mass, mass effect, or midline shift. No hydrocephalus or extra-axial fluid collection. Periventricular white matter changes, likely the sequela of chronic small vessel ischemic disease. Vascular: No hyperdense vessel. Skull: Negative for fracture or focal lesion. Sinuses/Orbits: No acute finding. Other: Fluid in the right mastoid air cells. IMPRESSION: 1. Increased hypodensity in the left internal  capsule, which correlates with the acute infarct noted on the 01/08/2023 MRI. No other new hypodensity to suggest additional acute infarct. 2. No acute intracranial hemorrhage. Electronically Signed   By: Merilyn Baba M.D.   On: 01/10/2023 13:39             LOS: 4 days       Emeterio Reeve, DO [facility] 01/13/2023, 2:26 PM    Dictation software may have been used to generate the above note. Typos may occur and escape review in typed/dictated notes. Please contact Dr Sheppard Coil directly for clarity if needed.  Staff may message me via secure chat in [place]  but this may not receive an immediate response,  please page me for urgent matters!  If 7PM-7AM, please contact night coverage [email or web address]

## 2023-01-13 NOTE — Plan of Care (Signed)
  Problem: Nutrition: Goal: Dietary intake will improve Outcome: Not Applicable  Comfort diet

## 2023-01-13 NOTE — Progress Notes (Signed)
Daily Progress Note   Patient Name: Bethany Hicks      Date: 01/13/2023 DOB: 1928/07/23  Age: 89 y.o. MRN#: [id number] Attending Physician: Emeterio Reeve, DO Primary Care Physician: Rusty Aus, MD Admit Date: 01/08/2023  Reason for Consultation/Follow-up: Establishing goals of care  HPI/Brief Hospital Review: 89 y.o. female with past medical history of mixed Alzheimer's and vascular dementia, major depressive disorder, HLD, B12 deficiency, hypothyroidism, and chronic hyperglycemia admitted [facility]/08/2023 with slurred speech.   MRI brain: 1.2 cm acute ischemic nonhemorrhagic infarct involving posterior limb of the left internal capsule. Underlying age-related cerebral atrophy with severe chronic microvascular ischemic disease.   Palliative Medicine consulted for assisting with goals of care discussions.   Unsuccessful NGT placement 3/21, family declined PEG tube placement, wanting to pursue hospice services   Transitioned to comfort measures 3/22-awaiting hospice disposition  Subjective: Extensive chart review has been completed prior to meeting patient including labs, vital signs, imaging, progress notes, orders, and available advanced directive documents from current and previous encounters.    Visited with Bethany Hicks at her bedside. Daughter-Kathy present during visit. Bethany Hicks awakens and acknowledges presence, attempts to communicate-noted ongoing dysarthria. Daughter-Kathy shares Bethany Hicks had an uneventful evening and she rested well. Symptom burden remains low-denies acute pain or discomfort.  Bethany Hicks shares Bethany Hicks only able to tolerate a few bites of applesauce. Noted minimal urine output documented over last 24 hours. Expect ongoing decline-Kathy verbalizes  awareness and understanding.  At this time, Bethany Hicks does not meet [facility] criteria but suspect with ongoing decline (minimal PO intake and minimal urine output) may become appropriate. TOC continues to work on bed search for LTC. Family plans to apply for [facility] Monday.  PMT to continue to follow for ongoing needs and support. Shared I will not be on service starting tomorrow-will plan to have provider from PMT touch base tomorrow.  Objective:  Physical Exam Constitutional:      General: She is not in acute distress.    Appearance: She is ill-appearing.  Pulmonary:     Effort: Pulmonary effort is normal. No respiratory distress.  Skin:    General: Skin is warm and dry.  Neurological:     Motor: Weakness present.  Vital Signs: BP (!) 185/85 (BP Location: Right Arm)   Hicks 78   Temp (!) 97.3 F (36.3 C)   Resp 16   Ht 5\' 11"  (1.803 m)   Wt 75.8 kg   SpO2 95%   BMI 23.29 kg/m  SpO2: SpO2: 95 % O2 Device: O2 Device: Room Air O2 Flow Rate:     Palliative Care Assessment & Plan   Assessment/Recommendation/Plan  DNR-comfort measures Planned hospice services at discharge-awaiting disposition Expect ongoing decline and may become appropriate for [facility] to continue to follow for ongoing needs and support  Thank you for allowing the Palliative Medicine Team to assist in the care of this patient.  Total time:  25 minutes  Time spent includes: Detailed review of medical records (labs, imaging, vital signs), medically appropriate exam (mental status, respiratory, cardiac, skin), discussed with treatment team, counseling and educating patient, family and staff, documenting clinical information, medication management and coordination of care.  Theodoro Grist, DNP, AGNP-C Palliative Medicine   Please contact Palliative Medicine Team phone at [phone] for questions and concerns.

## 2023-01-14 DIAGNOSIS — R4781 Slurred speech: Secondary | ICD-10-CM | POA: Diagnosis not present

## 2023-01-14 DIAGNOSIS — I639 Cerebral infarction, unspecified: Secondary | ICD-10-CM | POA: Diagnosis not present

## 2023-01-14 DIAGNOSIS — G9340 Encephalopathy, unspecified: Secondary | ICD-10-CM

## 2023-01-14 DIAGNOSIS — F03B0 Unspecified dementia, moderate, without behavioral disturbance, psychotic disturbance, mood disturbance, and anxiety: Secondary | ICD-10-CM | POA: Diagnosis not present

## 2023-01-14 DIAGNOSIS — Z515 Encounter for palliative care: Secondary | ICD-10-CM | POA: Diagnosis not present

## 2023-01-14 DIAGNOSIS — R1312 Dysphagia, oropharyngeal phase: Secondary | ICD-10-CM

## 2023-01-14 DIAGNOSIS — Z7189 Other specified counseling: Secondary | ICD-10-CM | POA: Diagnosis not present

## 2023-01-14 NOTE — Progress Notes (Signed)
PROGRESS NOTE    Bethany Hicks   [id number] DOB: 08-30-1928  DOA: 01/08/2023 Date of Service: 01/14/23 PCP: Rusty Aus, MD     Brief Narrative / Hospital Course:  Bethany Hicks is a 89 y.o. female with medical history significant of hypertension, hypothyroidism, dementia presenting with CVA.  Patient noted with slurred speech at facility around 930 this morning.  This is well off baseline per report.  Per report, patient was eating breakfast like normal prior to this.  Family was called to which patient's daughter went to evaluate the patient with patient noted slurred speech.  Patient was then taken to the ER by car with improvement in symptoms upon arrival.  03/19: Code stroke was called upon arrival.  Initial imaging grossly negative.  Of note patient also had some mild confusion within the past month including urine and labs that were done at primary care's office.  No reports of fevers or chills.  Reports no nausea or vomiting.  Reports remote history of CVA diagnosed years ago as an incidental finding. Presented to the ER afebrile, systolic pressures in the 180s to 190s, satting well on room air.  White count 6.6, hemoglobin 12.3, potassium 3.3, creatinine 0.8.  Initial code stroke CT head grossly stable.  Upon evaluation by admitting hospitalist, patient had new onset left-sided facial droop to which a repeat code stroke was called with neurology reevaluation. formal CVA evaluation including MRI of the brain, CTA of the head neck, TTE, risk stratification labs. On baby aspirin chronically. S/p plavix load in ER. PT agitation precluded timely CTA evaluation.  03/20: CTA H/N neg LVO, moderate atherosclerotic disease. MRI brain 1.2 cm acute ischemic nonhemorrhagic infarct involving the posterior limb of the left internal capsule. Underlying age-related cerebral atrophy with severe chronic microvascular ischemic disease. PT/OT eval pending. Long discussion today w/ family at bedside re: poor  prognosis if patient is not recovering her alertness / po intake. I am concerned for vascular dementia as cause of sudden change, in addition to CVA effects, and if this is the case I am concerned she is unlikely to recover. Will request additional input from neurology team re: prognostication. Discussed code status with daughter - given how patient is looking now, I hesitate to offer feeding tube of any kind pending neurology opinion, and I hesitate to offer CPR/intubation as I do not feel she would recover from resuscitation measures. Will leave as FULL CODE for now but daughter would like to speak w/ family about this further and we will revisit the topic tomorrow.  03/21: see IPAL note - pt DNR, palliative consult. Repeating CT head today - no new stroke. IR to place NG for nutrition --> unsuccessful. Palliative care discussion w/ family following unsuccessful NG placement, they are opting for hospice since NG unsuccessful and they do not wish to pursue PEG tube. TOC aware, hospice to reach out to daughter(s) tomorrow  03/22: stable. Pt goes back and forth alert/somnolent, difficult to understand speech but seems to be asking for water/food. Comfort measures and avoid po meds altogether, can eat as desired  03/23-03/24: stable, TOC working on placement. Not qualifying for inpatient hospice. [facility] cannot take her back.  03/25: more alert today, pleasant, speech still very difficult to understand.     Consultants:  Neurology   Procedures: None       ASSESSMENT & PLAN:   Principal Problem:   CVA (cerebral vascular accident) ([place]) Active Problems:   Hypothyroidism   HYPERCHOLESTEROLEMIA,  PURE   Essential hypertension   GERD   Dementia (HCC)  CVA (cerebral vascular accident) Hospice consulted, not candidate for inpatient hospice pending eval/recs for hospice care at SNF    Dysphagia as effect of CVA vs advancing dementia  NPO --> comfort feeds as desired NG per IR attempted,  not able to pass NG SLP following  Minimize po meds   Dementia D/c namenda, exelon d/t NPO   GERD D/c po PPI    Essential hypertension Unable to take po meds  Hydralazine IV scheduled - control HTN to avoid CP/HA Labetalol IV prn severe HTN   HLD D/c statin d/t NPO on comfort measures   Hypothyroidism D/c thyroid meds d/t NPO on comfort measures     DVT prophylaxis: lovenox  Pertinent IV fluids/nutrition: npo / comfort feeds  Central lines / invasive devices: none   Code Status: DNR   Current Admission Status: inpatient   TOC needs / Dispo plan: pend SNF/LTC vs hospice if deteriorates  Barriers to discharge / significant pending items: see above re: dispo             Subjective / Brief ROS:  Patient more alert today, appears pleasant   Family Communication: support persons at bedside on rounds     Objective Findings:  Vitals:   01/13/23 0817 01/13/23 2236 01/14/23 0508 01/14/23 0752  BP: (!) 185/85 (!) 179/82 (!) 161/85 (!) 179/80  Pulse: 78 73  73  Resp: 16 20  16   Temp: (!) 97.3 F (36.3 C) 98.1 F (36.7 C)  97.6 F (36.4 C)  TempSrc:  Oral    SpO2: 95% 95%  95%  Weight:      Height:        Intake/Output Summary (Last 24 hours) at 01/14/2023 1301 Last data filed at 01/14/2023 0630 Gross per 24 hour  Intake --  Output 450 ml  Net -450 ml    Filed Weights   01/08/23 1146  Weight: 75.8 kg    Examination:  Physical Exam Constitutional:      General: She is not in acute distress.    Appearance: She is ill-appearing.  HENT:     Mouth/Throat:     Mouth: Mucous membranes are dry.  Pulmonary:     Effort: Pulmonary effort is normal.  Musculoskeletal:     Right lower leg: No edema.     Left lower leg: No edema.  Skin:    General: Skin is warm and dry.  Neurological:     Mental Status: She is alert.          Scheduled Medications:   hydrALAZINE  10 mg Intravenous Q6H    Continuous Infusions:    PRN Medications:   antiseptic oral rinse, polyvinyl alcohol  Antimicrobials from admission:  Anti-infectives (From admission, onward)    None           Data Reviewed:  I have personally reviewed the following...  CBC: Recent Labs  Lab 01/08/23 1112 01/09/23 0820  WBC 6.6 7.7  NEUTROABS 4.2  --   HGB 12.3 12.5  HCT 39.6 39.9  MCV 88.2 86.7  PLT 227 0000000   Basic Metabolic Panel: Recent Labs  Lab 01/08/23 1112 01/09/23 0820  NA 141 139  K 3.3* 3.5  CL 102 103  CO2 30 28  GLUCOSE 81 92  BUN 13 10  CREATININE 0.78 0.78  CALCIUM 8.9 8.8*   GFR: Estimated Creatinine Clearance: 50.2 mL/min (by C-G formula based on SCr  of 0.78 mg/dL). Liver Function Tests: Recent Labs  Lab 01/08/23 1112 01/09/23 0820  AST 15 19  ALT 10 9  ALKPHOS 82 77  BILITOT 0.5 0.9  PROT 6.9 6.9  ALBUMIN 3.2* 3.3*   No results for input(s): "LIPASE", "AMYLASE" in the last 168 hours. No results for input(s): "AMMONIA" in the last 168 hours. Coagulation Profile: Recent Labs  Lab 01/08/23 1112  INR 1.0   Cardiac Enzymes: No results for input(s): "CKTOTAL", "CKMB", "CKMBINDEX", "TROPONINI" in the last 168 hours. BNP (last 3 results) No results for input(s): "PROBNP" in the last 8760 hours. HbA1C: No results for input(s): "HGBA1C" in the last 72 hours.  CBG: Recent Labs  Lab 01/08/23 1109 01/08/23 2033 01/09/23 0732 01/09/23 0908 01/09/23 2042  GLUCAP 81 94 80 79 113*   Lipid Profile: No results for input(s): "CHOL", "HDL", "LDLCALC", "TRIG", "CHOLHDL", "LDLDIRECT" in the last 72 hours.  Thyroid Function Tests: No results for input(s): "TSH", "T4TOTAL", "FREET4", "T3FREE", "THYROIDAB" in the last 72 hours. Anemia Panel: No results for input(s): "VITAMINB12", "FOLATE", "FERRITIN", "TIBC", "IRON", "RETICCTPCT" in the last 72 hours. Most Recent Urinalysis On File:     Component Value Date/Time   COLORURINE STRAW (A) 01/09/2023 0922   APPEARANCEUR CLEAR (A) 01/09/2023 0922   APPEARANCEUR  Clear 05/04/2012 0613   LABSPEC 1.015 01/09/2023 0922   LABSPEC 1.014 05/04/2012 0613   PHURINE 8.0 01/09/2023 0922   GLUCOSEU NEGATIVE 01/09/2023 0922   GLUCOSEU Negative 05/04/2012 0613   HGBUR NEGATIVE 01/09/2023 0922   BILIRUBINUR NEGATIVE 01/09/2023 0922   BILIRUBINUR Negative 05/04/2012 0613   KETONESUR NEGATIVE 01/09/2023 0922   PROTEINUR NEGATIVE 01/09/2023 0922   NITRITE NEGATIVE 01/09/2023 0922   LEUKOCYTESUR NEGATIVE 01/09/2023 0922   LEUKOCYTESUR Negative 05/04/2012 0613   Sepsis Labs: @LABRCNTIP (procalcitonin:4,lacticidven:4) Microbiology: No results found for this or any previous visit (from the past 240 hour(s)).    Radiology Studies last 3 days: DG Loyce Dys Tube Plc W/Fl W/Rad  Result Date: 01/10/2023 CLINICAL DATA:  Provided history: Encounter for nasogastric tube placement. EXAM: NASO G TUBE PLACEMENT WITH FL AND WITH RAD CONTRAST:  None. FLUOROSCOPY: Fluoroscopy Time:  6 seconds Radiation Exposure Index (if provided by the fluoroscopic device): 0.5 mGy. Number of Acquired Spot Images: None. COMPARISON:  None. FINDINGS: Multiple attempts were made to place a nasogastric tube via the right and left nostrils. However, during each attempt significant resistance was encountered, and the tube could not be advanced beyond the level of the nasal passages. IMPRESSION: Unsuccessful nasogastric tube placement. The tube could not be advanced beyond the nasal passages. Electronically Signed   By: Kellie Simmering D.O.   On: 01/10/2023 15:20   CT HEAD WO CONTRAST (5MM)  Result Date: 01/10/2023 CLINICAL DATA:  Stroke, follow-up EXAM: CT HEAD WITHOUT CONTRAST TECHNIQUE: Contiguous axial images were obtained from the base of the skull through the vertex without intravenous contrast. RADIATION DOSE REDUCTION: This exam was performed according to the departmental dose-optimization program which includes automated exposure control, adjustment of the mA and/or kV according to patient size and/or  use of iterative reconstruction technique. COMPARISON:  CT head 01/08/2023, MRI head 01/08/2023 FINDINGS: Brain: Increased hypodensity in the left internal capsule, which correlates with the acute infarct noted on the 01/08/2023 MRI. No other new hypodensity to suggest additional acute infarct. No evidence of acute hemorrhage, mass, mass effect, or midline shift. No hydrocephalus or extra-axial fluid collection. Periventricular white matter changes, likely the sequela of chronic small vessel ischemic disease. Vascular: No  hyperdense vessel. Skull: Negative for fracture or focal lesion. Sinuses/Orbits: No acute finding. Other: Fluid in the right mastoid air cells. IMPRESSION: 1. Increased hypodensity in the left internal capsule, which correlates with the acute infarct noted on the 01/08/2023 MRI. No other new hypodensity to suggest additional acute infarct. 2. No acute intracranial hemorrhage. Electronically Signed   By: Merilyn Baba M.D.   On: 01/10/2023 13:39             LOS: 5 days       Emeterio Reeve, DO [facility] 01/14/2023, 1:01 PM    Dictation software may have been used to generate the above note. Typos may occur and escape review in typed/dictated notes. Please contact Dr Sheppard Coil directly for clarity if needed.  Staff may message me via secure chat in [place]  but this may not receive an immediate response,  please page me for urgent matters!  If 7PM-7AM, please contact night coverage [email or web address]

## 2023-01-14 NOTE — Progress Notes (Signed)
Barrister's clerk Note  Follow up on referral for hospice services upon discharge from [facility].  Met at bedside today with patient, her daughter, Alyson Locket and her pastor.    Patient sitting up in the bed, awake and very alert.  I walked in the room and she said, "Hey, I know you!".  Patient very talkative and speech is more comprehensible today.  Patient continues to eat only sips and bites.    I spoke with [name] about IPU appropriateness/criteria and currently, patient does not meet criteria for IPU.  Bed search continues.  Notified [name] that we would continue to follow through the hospitalization and if anything changes in her mom's status, we would re-assess.  Continued collaboration with patient/family and hospital staff ongoing through final disposition.  Please call with any hospice related questions or concerns.  Thank you for allowing participation with this patient's care.  Dimas Aguas, RN Nurse Liaison [phone]

## 2023-01-14 NOTE — Progress Notes (Signed)
Manufacturing systems engineer Bethany Hicks  Follow up on hospice referral at discharge.  Patient is not able to go back to [facility].  Family plans to apply for Medicaid tomorrow.  Patient still eating sips and bites.  Had a couple bites of applesauce today.  Continues to have decreased urine output.  Patient does not meet criteria for IPU.  [name] is on pleasure feeds.  Decline is expected since she is unable to take in enough to sustain life of a long period of time.  Hospital Bethany team will continue to follow through final disposition.  Thank you for allowing participation in this patient's care.  Dimas Aguas, RN Nurse Bethany [phone]

## 2023-01-14 NOTE — Progress Notes (Signed)
Patient ID: NAVID MUSACCHIO, female   DOB: 1928/08/04, 89 y.o.   MRN: [id number]    Progress Note from the Palliative Medicine Team at [facility]   Patient Name: Bethany Hicks        Date: 01/14/2023 DOB: 05-01-1928  Age: 89 y.o. MRN#: [id number] Attending Physician: Emeterio Reeve, DO Primary Care Physician: Rusty Aus, MD Admit Date: 01/08/2023   Reason for Consultation/Follow-up:   Establishing Goals of care   HPI/ Brief hospital review  89 y.o. female with past medical history of mixed Alzheimer's and vascular dementia, major depressive disorder, HLD, B12 deficiency, hypothyroidism, and chronic hyperglycemia admitted on from [facility] 01/08/2023 with slurred speech.   MRI brain: 1.2 cm acute ischemic nonhemorrhagic infarct involving posterior limb of the left internal capsule. Underlying age-related cerebral atrophy with severe chronic microvascular ischemic disease.   Palliative Medicine consulted for assisting with goals of care discussions.   Unsuccessful NGT placement 3/21, family declined PEG tube placement, wanting to pursue hospice services   Transitioned to comfort measures 3/22-awaiting hospice disposition    Subjective   Extensive chart review has been completed prior to meeting patient including labs, vital signs, imaging, progress notes, orders, and available advanced directive documents from current and previous encounters.   This NP assessed patient at the bedside as a follow up to  for palliative medicine  needs and emotional support, son/Brad at bedside.   Patient is lethargic, she did accept sips of water and seemed to enjoy.  Family verbalized an understanding of the limited prognosis however at this time they are waiting for further conversation with hospice regarding eligibility for residential hospice bed.          Later in the afternoon I spoke to daughter [name] by telephone.  Continued conversation regarding current medical situation and likely  associated prognosis of several weeks.  Patient does not meet IPU eligibility for hospice at this time.  Education offered on hospice benefit;Philosophy and eligibility.  Education offered on the difference between hospice services at home versus assisted living versus SNF versus residential.  Education offered on the natural trajectory and expectations at end-of-life specific to the natural dehydration process as the patient takes continued decreasing oral intake.   Education offered today regarding  the importance of continued conversation with family and their  medical providers regarding overall plan of care and treatment options,  ensuring decisions are within the context of the patients values and GOCs.  Questions and concerns addressed   Discussed with Dr Sheppard Coil and [place] Liaison   Family meeting planned for tomorrow morning at 0900    Time: 50 minutes  Detailed review of medical records ( labs, imaging, vital signs), medically appropriate exam ( MS, skin, resp)   discussed with treatment team, counseling and education to patient, family, staff, documenting clinical information, medication management, coordination of care    Wadie Lessen NP  Palliative Medicine Team Team Phone # [phone] Pager [phone]

## 2023-01-14 NOTE — Care Management Important Message (Signed)
Important Message  Patient Details  Name: Bethany Hicks MRN: [id number] Date of Birth: August 21, 1928   Medicare Important Message Given:  Other (see comment)  On comfort care measures.  Medicare IM withheld at this time out of respect for patient and family.    Dannette Barbara 01/14/2023, 8:43 AM

## 2023-01-14 NOTE — Plan of Care (Signed)
  Problem: Ischemic Stroke/TIA Tissue Perfusion: Goal: Complications of ischemic stroke/TIA will be minimized Outcome: Progressing   Problem: Education: Goal: Knowledge of the prescribed therapeutic regimen will improve Outcome: Progressing   Problem: Coping: Goal: Ability to identify and develop effective coping behavior will improve Outcome: Progressing   Problem: Clinical Measurements: Goal: Quality of life will improve Outcome: Progressing   Problem: Role Relationship: Goal: Family's ability to cope with current situation will improve Outcome: Progressing Goal: Ability to verbalize concerns, feelings, and thoughts to partner or family member will improve Outcome: Progressing   Problem: Pain Management: Goal: Satisfaction with pain management regimen will improve Outcome: Progressing

## 2023-01-15 DIAGNOSIS — I639 Cerebral infarction, unspecified: Secondary | ICD-10-CM | POA: Diagnosis not present

## 2023-01-15 DIAGNOSIS — Z7189 Other specified counseling: Secondary | ICD-10-CM | POA: Diagnosis not present

## 2023-01-15 DIAGNOSIS — R1312 Dysphagia, oropharyngeal phase: Secondary | ICD-10-CM | POA: Diagnosis not present

## 2023-01-15 DIAGNOSIS — F03B0 Unspecified dementia, moderate, without behavioral disturbance, psychotic disturbance, mood disturbance, and anxiety: Secondary | ICD-10-CM | POA: Diagnosis not present

## 2023-01-15 DIAGNOSIS — R4781 Slurred speech: Secondary | ICD-10-CM | POA: Diagnosis not present

## 2023-01-15 DIAGNOSIS — Z515 Encounter for palliative care: Secondary | ICD-10-CM | POA: Diagnosis not present

## 2023-01-15 MED ORDER — MORPHINE SULFATE (CONCENTRATE) 10 MG/0.5ML PO SOLN: 5.0000 mg | ORAL | Status: DC | PRN

## 2023-01-15 NOTE — NC FL2 (Incomplete Revision)
  [place] LEVEL OF CARE FORM     IDENTIFICATION  Patient Name: Bethany Hicks Birthdate: 11-Feb-1928 Sex: female Admission Date (Current Location): 01/08/2023  [place] and [place] Number:  Engineering geologist and Address:  [facility], [address], [place], [place] [address]      Provider Number: [id number]  Attending Physician Name and Address:  Emeterio Reeve, DO  Relative Name and Phone Number:  Melida Quitter (Daughter) [phone]    Current Level of Care: Hospital Recommended Level of Care:  ALF with Hospice. Prior Approval Number:    Date Approved/Denied:   PASRR Number: [id number]  Discharge Plan: Other (Comment) (Long Term Care)    Current Diagnoses: Patient Active Problem List   Diagnosis Date Noted   CVA (cerebral vascular accident) ([place]) 01/08/2023   Dementia ([place]) 01/08/2023   Closed right ankle fracture 01/11/2021   Depression 01/11/2021   Fall 01/11/2021   HLD (hyperlipidemia) 07/21/2007   ANXIETY 07/21/2007   TOBACCO ABUSE 07/21/2007   Essential hypertension 07/21/2007   ALLERGIC RHINITIS 07/21/2007   GERD 07/21/2007   DIVERTICULOSIS, COLON 07/21/2007   OSTEOPENIA 07/21/2007   ATRIAL ARRHYTHMIAS 07/04/2007   SKIN LESION 07/04/2007   Hypothyroidism 03/20/2007   HYPERCHOLESTEROLEMIA, PURE 03/20/2007    Orientation RESPIRATION BLADDER Height & Weight     Self, Place  Normal Incontinent, External catheter Weight: 167 lb (75.8 kg) Height:  5\' 11"  (180.3 cm)  BEHAVIORAL SYMPTOMS/MOOD NEUROLOGICAL BOWEL NUTRITION STATUS      Incontinent    AMBULATORY STATUS COMMUNICATION OF NEEDS Skin   Total Care Verbally  (redness to bilaterial buttocks)                       Personal Care Assistance Level of Assistance  Total care Bathing Assistance: Maximum assistance     Total Care Assistance: Maximum assistance   Functional Limitations Info  Sight, Hearing, Speech Sight Info: Adequate Hearing Info:  Adequate Speech Info: Adequate    SPECIAL CARE FACTORS FREQUENCY                       Contractures Contractures Info: Not present    Additional Factors Info  Code Status Code Status Info: DNR             Current Medications (01/15/2023):  This is the current hospital active medication list Current Facility-Administered Medications  Medication Dose Route Frequency Provider Last Rate Last Admin   antiseptic oral rinse (BIOTENE) solution 15 mL  15 mL Topical PRN Luiz Ochoa, NP       hydrALAZINE (APRESOLINE) injection 10 mg  10 mg Intravenous Q6H Emeterio Reeve, DO   10 mg at 01/15/23 [id number]   polyvinyl alcohol (LIQUIFILM TEARS) 1.4 % ophthalmic solution 1 drop  1 drop Both Eyes QID PRN Luiz Ochoa, NP         Discharge Medications: Please see discharge summary for a list of discharge medications.  Relevant Imaging Results:  Relevant Lab Results:   Additional Information SS [id number]  Gerilyn Pilgrim, LCSW

## 2023-01-15 NOTE — Progress Notes (Signed)
Patient ID: Bethany Hicks, female   DOB: [birth year], 89 y.o.   MRN: [id number]    Progress Note from the Palliative Medicine Team at [facility]   Patient Name: Bethany Hicks        Date: 01/15/2023 DOB: 11/29/1927  Age: 89 y.o. MRN#: [id number] Attending Physician: Emeterio Reeve, DO Primary Care Physician: Rusty Aus, MD Admit Date: 01/08/2023   Reason for Consultation/Follow-up:   Establishing Goals of care   HPI/ Brief hospital review  89 y.o. female with past medical history of mixed Alzheimer's and vascular dementia, major depressive disorder, HLD, B12 deficiency, hypothyroidism, and chronic hyperglycemia admitted on from [facility] 01/08/2023 with slurred speech.   MRI brain: 1.2 cm acute ischemic nonhemorrhagic infarct involving posterior limb of the left internal capsule. Underlying age-related cerebral atrophy with severe chronic microvascular ischemic disease.   Palliative Medicine consulted for assisting with goals of care discussions.   Unsuccessful NGT placement 3/21, family declined PEG tube placement, wanting to pursue hospice services   Transitioned to comfort measures 3/22-awaiting hospice disposition.  Ongoing education regarding transition of care options.    Subjective   Extensive chart review has been completed prior to meeting patient including labs, vital signs, imaging, progress notes, orders, and available advanced directive documents from current and previous encounters.   This NP assessed patient at the bedside as a follow up to  for palliative medicine  needs and emotional support, and to meet with her 4 children; Marin Roberts, Leroy Sea and Shanon Brow as scheduled for continued plan of care education/discussion.  Patient is lethargic, however she continues to communicate in short sentences with family and is tolerating sips of water.    Education offered on the natural trajectory and expectations at end-of-life specific to decreasing oral intake,  increasing lethargy.    Education offered on the difference between a continued life prolonging/medical intervention path versus a comfort, dignity path allowing for natural death.  Education offered on the limited prognosis likely   Family verbalized an understanding of the limited prognosis however at this time they are waiting for further conversation with hospice regarding eligibility for residential hospice bed.         Education offered on hospice benefit;Philosophy and eligibility.  Education offered on the difference between hospice services at home versus assisted living versus SNF versus residential.  Plan of Care: -DNR/DNI -No artificial feeding or hydration now or in the future -Symptom management-education offered to family regarding utilization of sublingual Roxanol for symptoms specific to pain and dyspnea -Comfort feeds as tolerated -Transition to [facility] with hospice services, family plan to secure 24-hour caregivers, when eligible family is interested in inpatient hospice unit-family anticipate discharge in the morning -Prognosis is likely less than a few weeks  Hard choices booklet left for review  Questions and concerns addressed   Discussed with Dr Sheppard Coil and transition of care team  and   Hospice Liaison    Time: 50 minutes  Detailed review of medical records , medically appropriate exam    discussed with treatment team, counseling and education to patient, family, staff, documenting clinical information, medication management, coordination of care    Wadie Lessen NP  Palliative Medicine Team Team Phone # [phone] Pager [phone]

## 2023-01-15 NOTE — Plan of Care (Signed)
  Problem: Education: Goal: Knowledge of the prescribed therapeutic regimen will improve Outcome: Progressing   Problem: Coping: Goal: Ability to identify and develop effective coping behavior will improve Outcome: Progressing   Problem: Clinical Measurements: Goal: Quality of life will improve Outcome: Progressing   Problem: Respiratory: Goal: Verbalizations of increased ease of respirations will increase Outcome: Progressing   Problem: Role Relationship: Goal: Family's ability to cope with current situation will improve Outcome: Progressing   Problem: Pain Management: Goal: Satisfaction with pain management regimen will improve Outcome: Progressing   

## 2023-01-15 NOTE — NC FL2 (Addendum)
[place] LEVEL OF CARE FORM     IDENTIFICATION  Patient Name: Bethany Hicks Birthdate: [birth year] Sex: female Admission Date (Current Location): 01/08/2023  [place] and [place] Number:  Engineering geologist and Address:  [facility], [address], [place], [place] [address]      Provider Number: [id number]  Attending Physician Name and Address:  Emeterio Reeve, DO  Relative Name and Phone Number:  Melida Quitter (Daughter) [phone]    Current Level of Care: Hospital Recommended Level of Care:  ALF with Hospice. Prior Approval Number:    Date Approved/Denied:   PASRR Number: [id number]  Discharge Plan: ALF with Hospice    Current Diagnoses: Patient Active Problem List   Diagnosis Date Noted   CVA (cerebral vascular accident) ([place]) 01/08/2023   Dementia ([place]) 01/08/2023   Closed right ankle fracture 01/11/2021   Depression 01/11/2021   Fall 01/11/2021   HLD (hyperlipidemia) 07/21/2007   ANXIETY 07/21/2007   TOBACCO ABUSE 07/21/2007   Essential hypertension 07/21/2007   ALLERGIC RHINITIS 07/21/2007   GERD 07/21/2007   DIVERTICULOSIS, COLON 07/21/2007   OSTEOPENIA 07/21/2007   ATRIAL ARRHYTHMIAS 07/04/2007   SKIN LESION 07/04/2007   Hypothyroidism 03/20/2007   HYPERCHOLESTEROLEMIA, PURE 03/20/2007    Orientation RESPIRATION BLADDER Height & Weight     Self, Place  Normal Incontinent, External catheter Weight: 167 lb (75.8 kg) Height:  5\' 11"  (579FGE cm)  BEHAVIORAL SYMPTOMS/MOOD NEUROLOGICAL BOWEL NUTRITION STATUS      Incontinent    AMBULATORY STATUS COMMUNICATION OF NEEDS Skin   Total Care Verbally  (redness to bilaterial buttocks)                       Personal Care Assistance Level of Assistance  Total care Bathing Assistance: Maximum assistance     Total Care Assistance: Maximum assistance   Functional Limitations Info  Sight, Hearing, Speech Sight Info: Adequate Hearing Info: Adequate Speech  Info: Adequate    SPECIAL CARE FACTORS FREQUENCY                       Contractures Contractures Info: Not present    Additional Factors Info  Code Status Code Status Info: DNR             Current Medications (01/15/2023):  This is the current hospital active medication list Current Facility-Administered Medications  Medication Dose Route Frequency Provider Last Rate Last Admin   antiseptic oral rinse (BIOTENE) solution 15 mL  15 mL Topical PRN Luiz Ochoa, NP       hydrALAZINE (APRESOLINE) injection 10 mg  10 mg Intravenous Q6H Emeterio Reeve, DO   10 mg at 01/15/23 [id number]   polyvinyl alcohol (LIQUIFILM TEARS) 1.4 % ophthalmic solution 1 drop  1 drop Both Eyes QID PRN Luiz Ochoa, NP         Discharge Medications: Please see discharge summary for a list of discharge medications.    Medication List       STOP taking these medications     amLODipine 10 MG tablet Commonly known as: NORVASC    aspirin EC 81 MG tablet    carboxymethylcellulose 1 % ophthalmic solution    cyanocobalamin 1000 MCG/ML injection Commonly known as: VITAMIN B12    divalproex 125 MG DR tablet Commonly known as: DEPAKOTE    Fiber 625 MG Tabs    fluocinonide 0.05 % external solution Commonly known as: LIDEX  guaifenesin 100 MG/5ML syrup Commonly known as: ROBITUSSIN    ibuprofen 400 MG tablet Commonly known as: ADVIL    ketoconazole 2 % shampoo Commonly known as: NIZORAL    levothyroxine 88 MCG tablet Commonly known as: SYNTHROID    Magnesium Oxide -Mg Supplement 250 MG Tabs    memantine 5 MG tablet Commonly known as: NAMENDA    mupirocin ointment 2 % Commonly known as: BACTROBAN    rivastigmine 3 MG capsule Commonly known as: EXELON    senna-docusate 8.6-50 MG tablet Commonly known as: Senokot-S    sodium chloride 1 g tablet    traMADol 50 MG tablet Commonly known as: ULTRAM    valsartan 40 MG tablet Commonly known as: DIOVAN    Vitamin  D-1000 Max St 25 MCG (1000 UT) tablet Generic drug: Cholecalciferol           TAKE these medications     antiseptic oral rinse Liqd Apply 15 mLs topically as needed for dry mouth.    morphine CONCENTRATE 10 MG/0.5ML Soln concentrated solution Take 0.25 mLs (5 mg total) by mouth every 2 (two) hours as needed for shortness of breath or severe pain.    polyvinyl alcohol 1.4 % ophthalmic solution Commonly known as: LIQUIFILM TEARS Place 1 drop into both eyes 4 (four) times daily as needed for dry eyes.        Relevant Imaging Results:  Relevant Lab Results:   Additional Information SS [id number]  Gerilyn Pilgrim, LCSW

## 2023-01-15 NOTE — Progress Notes (Signed)
PROGRESS NOTE    Bethany Hicks   [id number] DOB: 20-Oct-1928  DOA: 01/08/2023 Date of Service: 01/15/23 PCP: Rusty Aus, MD     Brief Narrative / Hospital Course:  Bethany Hicks is a 89 y.o. female with medical history significant of hypertension, hypothyroidism, dementia presenting with CVA.  Patient noted with slurred speech at facility around 930 this morning.  This is well off baseline per report.  Per report, patient was eating breakfast like normal prior to this.  Family was called to which patient's daughter went to evaluate the patient with patient noted slurred speech.  Patient was then taken to the ER by car with improvement in symptoms upon arrival.  03/19: Code stroke was called upon arrival.  Initial imaging grossly negative.  Of note patient also had some mild confusion within the past month including urine and labs that were done at primary care's office.  No reports of fevers or chills.  Reports no nausea or vomiting.  Reports remote history of CVA diagnosed years ago as an incidental finding. Presented to the ER afebrile, systolic pressures in the 180s to 190s, satting well on room air.  White count 6.6, hemoglobin 12.3, potassium 3.3, creatinine 0.8.  Initial code stroke CT head grossly stable.  Upon evaluation by admitting hospitalist, patient had new onset left-sided facial droop to which a repeat code stroke was called with neurology reevaluation. formal CVA evaluation including MRI of the brain, CTA of the head neck, TTE, risk stratification labs. On baby aspirin chronically. S/p plavix load in ER. PT agitation precluded timely CTA evaluation.  03/20: CTA H/N neg LVO, moderate atherosclerotic disease. MRI brain 1.2 cm acute ischemic nonhemorrhagic infarct involving the posterior limb of the left internal capsule. Underlying age-related cerebral atrophy with severe chronic microvascular ischemic disease. PT/OT eval pending. Long discussion today w/ family at bedside re: poor  prognosis if patient is not recovering her alertness / po intake. I am concerned for vascular dementia as cause of sudden change, in addition to CVA effects, and if this is the case I am concerned she is unlikely to recover. Will request additional input from neurology team re: prognostication. Discussed code status with daughter - given how patient is looking now, I hesitate to offer feeding tube of any kind pending neurology opinion, and I hesitate to offer CPR/intubation as I do not feel she would recover from resuscitation measures. Will leave as FULL CODE for now but daughter would like to speak w/ family about this further and we will revisit the topic tomorrow.  03/21: see IPAL note - pt DNR, palliative consult. Repeating CT head today - no new stroke. IR to place NG for nutrition --> unsuccessful. Palliative care discussion w/ family following unsuccessful NG placement, they are opting for hospice since NG unsuccessful and they do not wish to pursue PEG tube. TOC aware, hospice to reach out to daughter(s) tomorrow  03/22: stable. Pt goes back and forth alert/somnolent, difficult to understand speech but seems to be asking for water/food. Comfort measures and avoid po meds altogether, can eat as desired  03/23-03/24: stable, TOC working on placement. Not qualifying for inpatient hospice. [facility] cannot take her back.  03/25: more alert today, pleasant, speech still very difficult to understand.  03/26: family meeting w/ palliative care. Plan for transfer tomorrow back to [facility] w/ hospice via [place], waiting on DME to be delivered.     Consultants:  Neurology   Procedures: None  ASSESSMENT & PLAN:   Principal Problem:   CVA (cerebral vascular accident) ([place]) Active Problems:   Hypothyroidism   HYPERCHOLESTEROLEMIA, PURE   Essential hypertension   GERD   Dementia ([place])  CVA (cerebral vascular accident) Hospice consulted, not candidate for inpatient  hospice Hospice care at [facility] once DME in place tomorrow - [facility] following    Dysphagia as effect of CVA vs advancing dementia  NPO --> comfort feeds as desired NG per IR attempted, not able to pass NG, family has declined PEG  SLP following  Palliative following  Minimize po meds   Dementia D/c namenda, exelon d/t NPO   GERD D/c po PPI    Essential hypertension Unable to take po meds  Hydralazine IV scheduled - control HTN to avoid CP/HA Labetalol IV prn severe HTN   HLD D/c statin d/t NPO on comfort measures   Hypothyroidism D/c thyroid meds d/t NPO on comfort measures     DVT prophylaxis: lovenox  Pertinent IV fluids/nutrition: npo / comfort feeds  Central lines / invasive devices: none   Code Status: DNR   Current Admission Status: inpatient   TOC needs / Dispo plan: pend SNF/LTC vs hospice if deteriorates  Barriers to discharge / significant pending items: see above re: dispo - expect discharge tomorrow  03/27             Subjective / Brief ROS:  Patient more alert today, appears pleasant   Family Communication: support persons at bedside on rounds     Objective Findings:  Vitals:   01/14/23 1629 01/14/23 2031 01/15/23 0337 01/15/23 0912  BP: (!) 173/81 (!) 191/83 (!) 175/82 (!) 185/84  Pulse: 73 65  72  Resp: 16 20  18   Temp: 97.7 F (36.5 C) 97.9 F (36.6 C)  98 F (36.7 C)  TempSrc:      SpO2: 96% 96%  97%  Weight:      Height:       No intake or output data in the 24 hours ending 01/15/23 1337   Filed Weights   01/08/23 1146  Weight: 75.8 kg    Examination:  Physical Exam Constitutional:      General: She is not in acute distress. HENT:     Mouth/Throat:     Mouth: Mucous membranes are dry.  Pulmonary:     Effort: Pulmonary effort is normal.  Musculoskeletal:     Right lower leg: No edema.     Left lower leg: No edema.  Skin:    General: Skin is warm and dry.  Neurological:     Mental Status: She is  alert.          Scheduled Medications:   hydrALAZINE  10 mg Intravenous Q6H    Continuous Infusions:    PRN Medications:  antiseptic oral rinse, morphine CONCENTRATE, polyvinyl alcohol  Antimicrobials from admission:  Anti-infectives (From admission, onward)    None           Data Reviewed:  I have personally reviewed the following...  CBC: Recent Labs  Lab 01/09/23 0820  WBC 7.7  HGB 12.5  HCT 39.9  MCV 86.7  PLT 0000000   Basic Metabolic Panel: Recent Labs  Lab 01/09/23 0820  NA 139  K 3.5  CL 103  CO2 28  GLUCOSE 92  BUN 10  CREATININE 0.78  CALCIUM 8.8*   GFR: Estimated Creatinine Clearance: 50.2 mL/min (by C-G formula based on SCr of 0.78 mg/dL). Liver Function Tests: Recent  Labs  Lab 01/09/23 0820  AST 19  ALT 9  ALKPHOS 77  BILITOT 0.9  PROT 6.9  ALBUMIN 3.3*   No results for input(s): "LIPASE", "AMYLASE" in the last 168 hours. No results for input(s): "AMMONIA" in the last 168 hours. Coagulation Profile: No results for input(s): "INR", "PROTIME" in the last 168 hours.  Cardiac Enzymes: No results for input(s): "CKTOTAL", "CKMB", "CKMBINDEX", "TROPONINI" in the last 168 hours. BNP (last 3 results) No results for input(s): "PROBNP" in the last 8760 hours. HbA1C: No results for input(s): "HGBA1C" in the last 72 hours.  CBG: Recent Labs  Lab 01/08/23 2033 01/09/23 0732 01/09/23 0908 01/09/23 2042  GLUCAP 94 80 79 113*   Lipid Profile: No results for input(s): "CHOL", "HDL", "LDLCALC", "TRIG", "CHOLHDL", "LDLDIRECT" in the last 72 hours.  Thyroid Function Tests: No results for input(s): "TSH", "T4TOTAL", "FREET4", "T3FREE", "THYROIDAB" in the last 72 hours. Anemia Panel: No results for input(s): "VITAMINB12", "FOLATE", "FERRITIN", "TIBC", "IRON", "RETICCTPCT" in the last 72 hours. Most Recent Urinalysis On File:     Component Value Date/Time   COLORURINE STRAW (A) 01/09/2023 0922   APPEARANCEUR CLEAR (A) 01/09/2023  0922   APPEARANCEUR Clear 05/04/2012 0613   LABSPEC 1.015 01/09/2023 0922   LABSPEC 1.014 05/04/2012 0613   PHURINE 8.0 01/09/2023 0922   GLUCOSEU NEGATIVE 01/09/2023 0922   GLUCOSEU Negative 05/04/2012 0613   HGBUR NEGATIVE 01/09/2023 0922   BILIRUBINUR NEGATIVE 01/09/2023 0922   BILIRUBINUR Negative 05/04/2012 0613   KETONESUR NEGATIVE 01/09/2023 0922   PROTEINUR NEGATIVE 01/09/2023 0922   NITRITE NEGATIVE 01/09/2023 0922   LEUKOCYTESUR NEGATIVE 01/09/2023 0922   LEUKOCYTESUR Negative 05/04/2012 0613   Sepsis Labs: @LABRCNTIP (procalcitonin:4,lacticidven:4) Microbiology: No results found for this or any previous visit (from the past 240 hour(s)).    Radiology Studies last 3 days: No results found.           LOS: 6 days       Emeterio Reeve, DO [facility] 01/15/2023, 1:37 PM    Dictation software may have been used to generate the above note. Typos may occur and escape review in typed/dictated notes. Please contact Dr Sheppard Coil directly for clarity if needed.  Staff may message me via secure chat in [place]  but this may not receive an immediate response,  please page me for urgent matters!  If 7PM-7AM, please contact night coverage [email or web address]

## 2023-01-15 NOTE — Progress Notes (Signed)
Hydralazine not given due to family refusal. Family was under the impression all medications except pain meds would be discontinued considering the pt was being placed on comfort care. Katy Foust,NP notified and Per Dr. Redgie Grayer note pt was to continue receiving Hydralzaine IV for Chest pain/Headache. This was explained to daughter at bedside. Daughter still did not want mother to receive this when other medication is available for pain. Pt resting comfortably and exhibits no signs of pain. Will continue to monitor for any changes.

## 2023-01-15 NOTE — Progress Notes (Signed)
[facility])  Plan is for patient to be admitted at [facility] with [facility] hospice services. Per family request, Hospital Bed has been ordered. Once DME in place, [facility] is prepared for patient DC.   If applicable, please send signed and completed DNR with patient/family upon discharge. Please provide prescriptions at discharge as needed to ensure ongoing symptom management and a transport packet.   [facility] information and contact numbers given to family and above information shared with TOC.    Please call with any questions/concerns.    Thank you for the opportunity to participate in this patient's care   Phillis Haggis, MSW [facility] Liaison  [phone]

## 2023-01-16 DIAGNOSIS — I10 Essential (primary) hypertension: Secondary | ICD-10-CM | POA: Diagnosis not present

## 2023-01-16 DIAGNOSIS — F03B0 Unspecified dementia, moderate, without behavioral disturbance, psychotic disturbance, mood disturbance, and anxiety: Secondary | ICD-10-CM | POA: Diagnosis not present

## 2023-01-16 DIAGNOSIS — R1319 Other dysphagia: Secondary | ICD-10-CM

## 2023-01-16 DIAGNOSIS — I639 Cerebral infarction, unspecified: Secondary | ICD-10-CM | POA: Diagnosis not present

## 2023-01-16 DIAGNOSIS — F0390 Unspecified dementia without behavioral disturbance: Secondary | ICD-10-CM | POA: Diagnosis not present

## 2023-01-16 DIAGNOSIS — E7800 Pure hypercholesterolemia, unspecified: Secondary | ICD-10-CM | POA: Diagnosis not present

## 2023-01-16 DIAGNOSIS — R638 Other symptoms and signs concerning food and fluid intake: Secondary | ICD-10-CM | POA: Diagnosis not present

## 2023-01-16 NOTE — Progress Notes (Signed)
Progress Note   Patient: Bethany Hicks [id number] DOB: 1928-02-04 DOA: 01/08/2023     7 DOS: the patient was seen and examined on 01/16/2023   Brief hospital course: Bethany Hicks is a 89 y.o. female with medical history significant of hypertension, hypothyroidism, dementia presenting with CVA.  Patient noted with slurred speech at facility around 930 this morning.  This is well off baseline per report.  Per report, patient was eating breakfast like normal prior to this.  Family was called to which patient's daughter went to evaluate the patient with patient noted slurred speech.  Patient was then taken to the ER by car with improvement in symptoms upon arrival.  03/19: Code stroke was called upon arrival.  Initial imaging grossly negative.  Of note patient also had some mild confusion within the past month including urine and labs that were done at primary care's office.  No reports of fevers or chills.  Reports no nausea or vomiting.  Reports remote history of CVA diagnosed years ago as an incidental finding. Presented to the ER afebrile, systolic pressures in the 180s to 190s, satting well on room air.  White count 6.6, hemoglobin 12.3, potassium 3.3, creatinine 0.8.  Initial code stroke CT head grossly stable.  Upon evaluation by admitting hospitalist, patient had new onset left-sided facial droop to which a repeat code stroke was called with neurology reevaluation. formal CVA evaluation including MRI of the brain, CTA of the head neck, TTE, risk stratification labs. On baby aspirin chronically. S/p plavix load in ER. PT agitation precluded timely CTA evaluation.  03/20: CTA H/N neg LVO, moderate atherosclerotic disease. MRI brain 1.2 cm acute ischemic nonhemorrhagic infarct involving the posterior limb of the left internal capsule. Underlying age-related cerebral atrophy with severe chronic microvascular ischemic disease. PT/OT eval pending. Long discussion today w/ family at bedside re: poor prognosis if  patient is not recovering her alertness / po intake. I am concerned for vascular dementia as cause of sudden change, in addition to CVA effects, and if this is the case I am concerned she is unlikely to recover. Will request additional input from neurology team re: prognostication. Discussed code status with daughter - given how patient is looking now, I hesitate to offer feeding tube of any kind pending neurology opinion, and I hesitate to offer CPR/intubation as I do not feel she would recover from resuscitation measures. Will leave as FULL CODE for now but daughter would like to speak w/ family about this further and we will revisit the topic tomorrow.  03/21: see IPAL note - pt DNR, palliative consult. Repeating CT head today - no new stroke. IR to place NG for nutrition --> unsuccessful. Palliative care discussion w/ family following unsuccessful NG placement, they are opting for hospice since NG unsuccessful and they do not wish to pursue PEG tube. TOC aware, hospice to reach out to daughter(s) tomorrow  03/22: stable. Pt goes back and forth alert/somnolent, difficult to understand speech but seems to be asking for water/food. Comfort measures and avoid po meds altogether, can eat as desired  03/23-03/24: stable, TOC working on placement. Not qualifying for inpatient hospice. [facility] cannot take her back.  03/25: more alert today, pleasant, speech still very difficult to understand.  03/26: family meeting w/ palliative care. Plan for transfer tomorrow back to [facility] w/ hospice via [place], waiting on DME to be delivered.  3/27.  Family trying to set up 24/7 care at facility.  Patient will also be  followed with hospice.  Consultants:  Neurology  Palliative care Hospice  Procedures: None            Assessment and Plan: * Acute stroke due to ischemia [facility]) Patient will go back to [place] home with hospice following.  Family did not want to pursue PEG feeding tube with her  dementia and stroke.  Patient on clear liquid diet as tolerated.  High risk for aspiration.  MRI showed a 1.2 cm acute ischemic nonhemorrhagic infarct involving the posterior limb of the left internal capsule.  Dementia without behavioral disturbance ([place]) Discontinued medications for dementia  GERD Medications discontinued  Essential hypertension Medications discontinued  HYPERCHOLESTEROLEMIA, PURE Medications discontinued  Hypothyroidism Medication discontinued        Subjective: Patient with some slurred speech.  Very poor memory.  Turns to family for answering questions.  Feels okay.  Physical Exam: Vitals:   01/15/23 0337 01/15/23 0912 01/15/23 1547 01/15/23 2120  BP: (!) 175/82 (!) 185/84 (!) 183/77 (!) 186/83  Pulse:  72 68 (!) 53  Resp:  18 18 16   Temp:  98 F (36.7 C) 98 F (36.7 C) 97.9 F (36.6 C)  TempSrc:      SpO2:  97% 96% 96%  Weight:      Height:       Physical Exam HENT:     Head: Normocephalic.     Mouth/Throat:     Pharynx: No oropharyngeal exudate.  Eyes:     General: Lids are normal.     Conjunctiva/sclera: Conjunctivae normal.  Cardiovascular:     Rate and Rhythm: Normal rate and regular rhythm.     Heart sounds: Normal heart sounds, S1 normal and S2 normal.  Pulmonary:     Breath sounds: Normal breath sounds. No decreased breath sounds, wheezing, rhonchi or rales.  Abdominal:     Palpations: Abdomen is soft.     Tenderness: There is no abdominal tenderness.  Musculoskeletal:     Right lower leg: No swelling.     Left lower leg: No swelling.  Skin:    General: Skin is warm.     Findings: No rash.  Neurological:     Mental Status: She is alert.     Comments: Answer some questions but turns to family to answer other questions.  Slurred speech.     Data Reviewed: Creatinine 0.78, LDL 157, hemoglobin 12.5  Family Communication: Spoke with patient's daughter and son-in-law at the bedside  Disposition: Status is:  Inpatient Remains inpatient appropriate because: Family trying to set up 24/7 care  Planned Discharge Destination: Likely home with hospice    Time spent: 28 minutes  Author: Loletha Grayer, MD 01/16/2023 3:46 PM  For on call review [email or web address].

## 2023-01-16 NOTE — Assessment & Plan Note (Signed)
Discontinued medications for dementia

## 2023-01-16 NOTE — Progress Notes (Signed)
Patient ID: Bethany Hicks, female   DOB: Mar 12, 1928, 89 y.o.   MRN: [id number]    Progress Note from the Palliative Medicine Team at [facility]   Patient Name: Bethany Hicks        Date: 01/16/2023 DOB: 07-26-1928  Age: 89 y.o. MRN#: [id number] Attending Physician: Loletha Grayer, MD Primary Care Physician: Rusty Aus, MD Admit Date: 01/08/2023   Reason for Consultation/Follow-up:   Establishing Goals of care   HPI/ Brief hospital review  89 y.o. female with past medical history of mixed Alzheimer's and vascular dementia, major depressive disorder, HLD, B12 deficiency, hypothyroidism, and chronic hyperglycemia admitted on from [facility] 01/08/2023 with slurred speech.   MRI brain: 1.2 cm acute ischemic nonhemorrhagic infarct involving posterior limb of the left internal capsule. Underlying age-related cerebral atrophy with severe chronic microvascular ischemic disease.   Palliative Medicine consulted for assisting with goals of care discussions.   Unsuccessful NGT placement 3/21, family declined PEG tube placement, wanting to pursue hospice services   Transitioned to comfort measures 3/22-awaiting hospice disposition.  Ongoing education regarding transition of care options.  Subjective   Extensive chart review has been completed prior to meeting patient including labs, vital signs, imaging, progress notes, orders, and available advanced directive documents from current and previous encounters.   This NP assessed patient at the bedside as a follow up to  for palliative medicine  needs and emotional support, patient appears comfortable she is alert and verbally communicating in short sentences.  Rhonda/caregiver at bedside.  No family present  Discussed with attending/Dr. Leslye Peer, he is aware the family is in process of securing 24-hour caregivers back at [facility] for transfer tomorrow.  Plan of Care: -DNR/DNI -No artificial feeding or hydration now or in the  future -Symptom management-education offered to family regarding utilization of sublingual Roxanol for symptoms specific to pain and dyspnea -Comfort feeds as tolerated -Transition to [facility] with hospice services, family plan to secure 24-hour caregivers, -Prognosis is likely weeks   Time: 25 minutes  PMT will continue to support holistically  Detailed review of medical records , medically appropriate exam    discussed with treatment team, counseling and education to patient, family, staff, documenting clinical information, medication management, coordination of care    Wadie Lessen NP  Palliative Medicine Team Team Phone # [phone] Pager [phone]

## 2023-01-16 NOTE — Assessment & Plan Note (Signed)
Medications discontinued.

## 2023-01-16 NOTE — Assessment & Plan Note (Addendum)
Patient will go back to [facility] with hospice following.  Family did not want to pursue PEG feeding tube with her dementia and stroke.  Patient on clear liquid diet as tolerated.  High risk for aspiration.  MRI showed a 1.2 cm acute ischemic nonhemorrhagic infarct involving the posterior limb of the left internal capsule.

## 2023-01-16 NOTE — Progress Notes (Signed)
[facility])   Plan is for patient to be admitted at [facility] with [facility] hospice services. Per family report, DME is anticipated to arrive between 10am-12pm today. MSW spoke with daughter/Kathyrn and reports that 24/7 caregivers will likely be in place on 3.28. Curt Bears plans to speak with [facility] IDT today to finalize DC date and will update MSW.   If applicable, please send signed and completed DNR with patient/family upon discharge. Please provide prescriptions at discharge as needed to ensure ongoing symptom management and a transport packet.   [facility] information and contact numbers given to family and above information shared with TOC.    Please call with any questions/concerns.    Thank you for the opportunity to participate in this patient's care   Phillis Haggis, MSW [facility] Liaison  [phone]

## 2023-01-16 NOTE — Assessment & Plan Note (Signed)
Medication discontinued 

## 2023-01-17 DIAGNOSIS — R1312 Dysphagia, oropharyngeal phase: Secondary | ICD-10-CM | POA: Diagnosis not present

## 2023-01-17 DIAGNOSIS — I639 Cerebral infarction, unspecified: Secondary | ICD-10-CM | POA: Diagnosis not present

## 2023-01-17 DIAGNOSIS — Z515 Encounter for palliative care: Secondary | ICD-10-CM | POA: Diagnosis not present

## 2023-01-17 MED ORDER — POLYVINYL ALCOHOL 1.4 % OP SOLN: 1.0000 [drp] | Freq: Four times a day (QID) | OPHTHALMIC | 0 refills | Status: DC | PRN

## 2023-01-17 MED ORDER — BIOTENE DRY MOUTH MT LIQD: 15.0000 mL | OROMUCOSAL | 0 refills | Status: DC | PRN

## 2023-01-17 MED ORDER — MORPHINE SULFATE (CONCENTRATE) 10 MG/0.5ML PO SOLN: 5.0000 mg | ORAL | 0 refills | Status: DC | PRN

## 2023-01-17 NOTE — Progress Notes (Signed)
Patient ID: Bethany Hicks, female   DOB: 03/11/1928, 89 y.o.   MRN: [id number]    Progress Note from the Palliative Medicine Team at [facility]   Patient Name: Bethany Hicks        Date: 01/17/2023 DOB: 1928-04-05  Age: 89 y.o. MRN#: [id number] Attending Physician: Loletha Grayer, MD Primary Care Physician: Rusty Aus, MD Admit Date: 01/08/2023   Reason for Consultation/Follow-up:   Establishing Goals of care   HPI/ Brief hospital review  89 y.o. female with past medical history of mixed Alzheimer's and vascular dementia, major depressive disorder, HLD, B12 deficiency, hypothyroidism, and chronic hyperglycemia admitted on from [facility] 01/08/2023 with slurred speech.   MRI brain: 1.2 cm acute ischemic nonhemorrhagic infarct involving posterior limb of the left internal capsule. Underlying age-related cerebral atrophy with severe chronic microvascular ischemic disease.   Palliative Medicine consulted for assisting with goals of care discussions.   Unsuccessful NGT placement 3/21, family declined PEG tube placement, wanting to pursue hospice services   Transitioned to comfort measures 3/22-awaiting hospice disposition.  Ongoing education regarding transition of care options.    Subjective   Extensive chart review has been completed prior to meeting patient including labs, vital signs, imaging, progress notes, orders, and available advanced directive documents from current and previous encounters.   This NP assessed patient at the bedside as a follow up to  for palliative medicine  needs and emotional support,daughter  [name] at bedside   Patient is lethargic, however she continues to communicate in short sentences with family and is tolerating sips of water.  She is having continued slow, physical functional and cognitive decline.  Education offered on the natural trajectory and expectations at end-of-life specific to decreasing oral intake, increasing lethargy.    Plan is  for patient to transition to [facility] under hospice services.  Family has spoken in detail with hospice.  Family currently putting in place 24-hour caregivers at [facility].  Family understand prognosis is likely 8 weeks   Emotional support offered        Plan of Care: -DNR/DNI -No artificial feeding or hydration now or in the future -Symptom management-education offered to family regarding utilization of sublingual Roxanol for symptoms specific to pain and dyspnea -Comfort feeds as tolerated -Transition to [facility] with hospice services, family plan to secure 24-hour caregivers, when eligible family is interested in inpatient hospice unit-family anticipate discharge in the morning -Prognosis is likely  weeks   Questions and concerns addressed     Time: 35 minutes  Detailed review of medical records , medically appropriate exam    discussed with treatment team, counseling and education to patient, family, staff, documenting clinical information, medication management, coordination of care    Wadie Lessen NP  Palliative Medicine Team Team Phone # [phone] Pager [phone]

## 2023-01-17 NOTE — Progress Notes (Signed)
Manufacturing engineer (ACC)   Family confirms that DME (bed) has been delivered. [facility] prepared for patient DC once 24/7 care services are in place.   If applicable, please send signed and completed DNR with patient/family upon discharge. Please provide prescriptions at discharge as needed to ensure ongoing symptom management and a transport packet.   [facility] information and contact numbers given to family and above information shared with TOC.    Please call with any questions/concerns.    Thank you for the opportunity to participate in this patient's care   Phillis Haggis, MSW [facility] Liaison  [phone]

## 2023-01-17 NOTE — Discharge Summary (Signed)
Physician Discharge Summary   Patient: Bethany Hicks MRN: [id number] DOB: 12-28-1927  Admit date:     01/08/2023  Discharge date: 01/17/23  Discharge Physician: Bethany Hicks   PCP: Bethany Aus, MD   Recommendations at discharge:   Follow-up with hospice at facility 1 day.  Discharge Diagnoses: Principal Problem:   Acute stroke due to ischemia [facility]) Active Problems:   Dementia without behavioral disturbance ([place])   Hypothyroidism   HYPERCHOLESTEROLEMIA, PURE   Essential hypertension   GERD    Hospital Course: Bethany Hicks is a 89 y.o. female with medical history significant of hypertension, hypothyroidism, dementia presenting with CVA.  Patient noted with slurred speech at facility around 930 this morning.  This is well off baseline per report.  Per report, patient was eating breakfast like normal prior to this.  Family was called to which patient's daughter went to evaluate the patient with patient noted slurred speech.  Patient was then taken to the ER by car with improvement in symptoms upon arrival.  03/19: Code stroke was called upon arrival.  Initial imaging grossly negative.  Of note patient also had some mild confusion within the past month including urine and labs that were done at primary care's office.  No reports of fevers or chills.  Reports no nausea or vomiting.  Reports remote history of CVA diagnosed years ago as an incidental finding. Presented to the ER afebrile, systolic pressures in the 180s to 190s, satting well on room air.  White count 6.6, hemoglobin 12.3, potassium 3.3, creatinine 0.8.  Initial code stroke CT head grossly stable.  Upon evaluation by admitting hospitalist, patient had new onset left-sided facial droop to which a repeat code stroke was called with neurology reevaluation. formal CVA evaluation including MRI of the brain, CTA of the head neck, TTE, risk stratification labs. On baby aspirin chronically. S/p plavix load in ER. PT agitation precluded  timely CTA evaluation.  03/20: CTA H/N neg LVO, moderate atherosclerotic disease. MRI brain 1.2 cm acute ischemic nonhemorrhagic infarct involving the posterior limb of the left internal capsule. Underlying age-related cerebral atrophy with severe chronic microvascular ischemic disease. PT/OT eval pending. Long discussion today w/ family at bedside re: poor prognosis if patient is not recovering her alertness / po intake. I am concerned for vascular dementia as cause of sudden change, in addition to CVA effects, and if this is the case I am concerned she is unlikely to recover. Will request additional input from neurology team re: prognostication. Discussed code status with daughter - given how patient is looking now, I hesitate to offer feeding tube of any kind pending neurology opinion, and I hesitate to offer CPR/intubation as I do not feel she would recover from resuscitation measures. Will leave as FULL CODE for now but daughter would like to speak w/ family about this further and we will revisit the topic tomorrow.  03/21: see IPAL note - pt DNR, palliative consult. Repeating CT head today - no new stroke. IR to place NG for nutrition --> unsuccessful. Palliative care discussion w/ family following unsuccessful NG placement, they are opting for hospice since NG unsuccessful and they do not wish to pursue PEG tube. TOC aware, hospice to reach out to daughter(s) tomorrow  03/22: stable. Pt goes back and forth alert/somnolent, difficult to understand speech but seems to be asking for water/food. Comfort measures and avoid po meds altogether, can eat as desired  03/23-03/24: stable, TOC working on placement. Not qualifying for inpatient hospice.  [facility] cannot take her back.  03/25: more alert today, pleasant, speech still very difficult to understand.  03/26: family meeting w/ palliative care. Plan for transfer tomorrow back to [facility] w/ hospice via [place], waiting on DME to be delivered.   3/27.  Family trying to set up 24/7 care at facility.  Patient will also be followed with hospice. 3/28.  Family confirms 24/7 care at facility.  Consultants:  Neurology  Palliative care Hospice           Assessment and Plan: * Acute stroke due to ischemia [facility]) Patient will go back to [facility] with hospice following.  Family did not want to pursue PEG feeding tube with her dementia and stroke.  Patient on clear liquid diet as tolerated.  High risk for aspiration.  MRI showed a 1.2 cm acute ischemic nonhemorrhagic infarct involving the posterior limb of the left internal capsule.  Dementia without behavioral disturbance ([place]) Discontinued medications for dementia  GERD Medications discontinued  Essential hypertension Medications discontinued  HYPERCHOLESTEROLEMIA, PURE Medications discontinued  Hypothyroidism Medication discontinued         Consultants: Neurology, palliative care and hospice Procedures performed: NG tube placement was unsuccessful Disposition: [facility] with 24/7 care and hospice following Diet recommendation:  Clear liquid diet as tolerated DISCHARGE MEDICATION: Allergies as of 01/17/2023       Reactions   Simvastatin Other (See Comments)   Muscle pain   Venlafaxine Other (See Comments)   Hallucinations    Ace Inhibitors Cough   Amlodipine Besylate    REACTION: Fatigue   Calcitonin (salmon)    Ciprofloxacin Swelling   Joint swelling.   Doxazosin Mesylate    REACTION: Urine incont.   Hydrocod Poli-chlorphe Poli Er    Quetiapine Other (See Comments)   Weak and lethargic   Raloxifene    REACTION: Pain   Risedronate Sodium    REACTION: GI   Codeine Anxiety   Singulair [montelukast] Anxiety        Medication List     STOP taking these medications    amLODipine 10 MG tablet Commonly known as: NORVASC   aspirin EC 81 MG tablet   carboxymethylcellulose 1 % ophthalmic solution    cyanocobalamin 1000 MCG/ML injection Commonly known as: VITAMIN B12   divalproex 125 MG DR tablet Commonly known as: DEPAKOTE   Fiber 625 MG Tabs   fluocinonide 0.05 % external solution Commonly known as: LIDEX   guaifenesin 100 MG/5ML syrup Commonly known as: ROBITUSSIN   ibuprofen 400 MG tablet Commonly known as: ADVIL   ketoconazole 2 % shampoo Commonly known as: NIZORAL   levothyroxine 88 MCG tablet Commonly known as: SYNTHROID   Magnesium Oxide -Mg Supplement 250 MG Tabs   memantine 5 MG tablet Commonly known as: NAMENDA   mupirocin ointment 2 % Commonly known as: BACTROBAN   rivastigmine 3 MG capsule Commonly known as: EXELON   senna-docusate 8.6-50 MG tablet Commonly known as: Senokot-S   sodium chloride 1 g tablet   traMADol 50 MG tablet Commonly known as: ULTRAM   valsartan 40 MG tablet Commonly known as: DIOVAN   Vitamin D-1000 Max St 25 MCG (1000 UT) tablet Generic drug: Cholecalciferol       TAKE these medications    antiseptic oral rinse Liqd Apply 15 mLs topically as needed for dry mouth.   morphine CONCENTRATE 10 MG/0.5ML Soln concentrated solution Take 0.25 mLs (5 mg total) by mouth every 2 (two) hours as  needed for shortness of breath or severe pain.   polyvinyl alcohol 1.4 % ophthalmic solution Commonly known as: LIQUIFILM TEARS Place 1 drop into both eyes 4 (four) times daily as needed for dry eyes.        Follow-up Information     hospice at facility Follow up in 1 day(s).                 Discharge Exam: Filed Weights   01/08/23 1146  Weight: 75.8 kg   Physical Exam HENT:     Head: Normocephalic.     Mouth/Throat:     Pharynx: No oropharyngeal exudate.  Eyes:     General: Lids are normal.     Conjunctiva/sclera: Conjunctivae normal.  Cardiovascular:     Rate and Rhythm: Normal rate and regular rhythm.     Heart sounds: Normal heart sounds, S1 normal and S2 normal.  Pulmonary:     Breath sounds: Normal  breath sounds. No decreased breath sounds, wheezing, rhonchi or rales.  Abdominal:     Palpations: Abdomen is soft.     Tenderness: There is no abdominal tenderness.  Musculoskeletal:     Right lower leg: No swelling.     Left lower leg: No swelling.  Skin:    General: Skin is warm.     Findings: No rash.  Neurological:     Mental Status: She is alert.     Comments: Answer some questions but turns to family to answer other questions.  Slurred speech.      Condition at discharge: fair  The results of significant diagnostics from this hospitalization (including imaging, microbiology, ancillary and laboratory) are listed below for reference.   Imaging Studies: DG Loyce Dys Tube Plc W/Fl W/Rad  Result Date: 01/10/2023 CLINICAL DATA:  Provided history: Encounter for nasogastric tube placement. EXAM: NASO G TUBE PLACEMENT WITH FL AND WITH RAD CONTRAST:  None. FLUOROSCOPY: Fluoroscopy Time:  6 seconds Radiation Exposure Index (if provided by the fluoroscopic device): 0.5 mGy. Number of Acquired Spot Images: None. COMPARISON:  None. FINDINGS: Multiple attempts were made to place a nasogastric tube via the right and left nostrils. However, during each attempt significant resistance was encountered, and the tube could not be advanced beyond the level of the nasal passages. IMPRESSION: Unsuccessful nasogastric tube placement. The tube could not be advanced beyond the nasal passages. Electronically Signed   By: Kellie Simmering D.O.   On: 01/10/2023 15:20   CT HEAD WO CONTRAST (5MM)  Result Date: 01/10/2023 CLINICAL DATA:  Stroke, follow-up EXAM: CT HEAD WITHOUT CONTRAST TECHNIQUE: Contiguous axial images were obtained from the base of the skull through the vertex without intravenous contrast. RADIATION DOSE REDUCTION: This exam was performed according to the departmental dose-optimization program which includes automated exposure control, adjustment of the mA and/or kV according to patient size and/or use  of iterative reconstruction technique. COMPARISON:  CT head 01/08/2023, MRI head 01/08/2023 FINDINGS: Brain: Increased hypodensity in the left internal capsule, which correlates with the acute infarct noted on the 01/08/2023 MRI. No other new hypodensity to suggest additional acute infarct. No evidence of acute hemorrhage, mass, mass effect, or midline shift. No hydrocephalus or extra-axial fluid collection. Periventricular white matter changes, likely the sequela of chronic small vessel ischemic disease. Vascular: No hyperdense vessel. Skull: Negative for fracture or focal lesion. Sinuses/Orbits: No acute finding. Other: Fluid in the right mastoid air cells. IMPRESSION: 1. Increased hypodensity in the left internal capsule, which correlates with the acute infarct noted  on the 01/08/2023 MRI. No other new hypodensity to suggest additional acute infarct. 2. No acute intracranial hemorrhage. Electronically Signed   By: Merilyn Baba M.D.   On: 01/10/2023 13:39   ECHOCARDIOGRAM COMPLETE  Result Date: 01/09/2023    ECHOCARDIOGRAM REPORT   Patient Name:   ANGELYNE CONKLING    Date of Exam: 01/08/2023 Medical Rec #:  [id number]     Height:       71.0 in Accession #:    [id number]    Weight:       167.0 lb Date of Birth:  02-Aug-1928     BSA:          1.953 m Patient Age:    69 years      BP:           192/95 mmHg Patient Gender: F             HR:           60 bpm. Exam Location:  [address] Procedure: 2D Echo, Cardiac Doppler and Color Doppler Indications:     G45.9 TIA  History:         Patient has no prior history of Echocardiogram examinations.                  Risk Factors:Hypertension. Hypothyroidism.  Sonographer:     Cresenciano Lick RDCS Referring Phys:  [place] Diagnosing Phys: Nelva Bush MD IMPRESSIONS  1. Left ventricular ejection fraction, by estimation, is >55%. The left ventricle has normal function. Left ventricular endocardial border not optimally defined to evaluate regional wall  motion. There is mild left ventricular hypertrophy. Left ventricular diastolic parameters are consistent with Grade I diastolic dysfunction (impaired relaxation).  2. Right ventricular systolic function is normal. The right ventricular size is normal. There is normal pulmonary artery systolic pressure.  3. The mitral valve is normal in structure. Trivial mitral valve regurgitation. No evidence of mitral stenosis.  4. The aortic valve is tricuspid. There is mild thickening of the aortic valve. Aortic valve regurgitation is mild. Aortic valve sclerosis is present, with no evidence of aortic valve stenosis.  5. There is mild dilatation of the ascending aorta, measuring 37 mm.  6. The inferior vena cava is normal in size with greater than 50% respiratory variability, suggesting right atrial pressure of 3 mmHg. FINDINGS  Left Ventricle: Left ventricular ejection fraction, by estimation, is >55%. The left ventricle has normal function. Left ventricular endocardial border not optimally defined to evaluate regional wall motion. The left ventricular internal cavity size was  normal in size. There is mild left ventricular hypertrophy. Left ventricular diastolic parameters are consistent with Grade I diastolic dysfunction (impaired relaxation). Right Ventricle: The right ventricular size is normal. No increase in right ventricular wall thickness. Right ventricular systolic function is normal. There is normal pulmonary artery systolic pressure. The tricuspid regurgitant velocity is 1.90 m/s, and  with an assumed right atrial pressure of 3 mmHg, the estimated right ventricular systolic pressure is 0000000 mmHg. Left Atrium: Left atrial size was normal in size. Right Atrium: Right atrial size was normal in size. Pericardium: There is no evidence of pericardial effusion. Mitral Valve: The mitral valve is normal in structure. Trivial mitral valve regurgitation. No evidence of mitral valve stenosis. Tricuspid Valve: The tricuspid valve  is grossly normal. Tricuspid valve regurgitation is mild. Aortic Valve: The aortic valve is tricuspid. There is mild thickening of the aortic valve. Aortic valve regurgitation is mild.  Aortic valve sclerosis is present, with no evidence of aortic valve stenosis. Pulmonic Valve: The pulmonic valve was not well visualized. Pulmonic valve regurgitation is not visualized. No evidence of pulmonic stenosis. Aorta: The aortic root is normal in size and structure. There is mild dilatation of the ascending aorta, measuring 37 mm. Pulmonary Artery: The pulmonary artery is of normal size. Venous: The inferior vena cava is normal in size with greater than 50% respiratory variability, suggesting right atrial pressure of 3 mmHg. IAS/Shunts: No atrial level shunt detected by color flow Doppler.  LEFT VENTRICLE PLAX 2D LVIDd:         3.70 cm   Diastology LVIDs:         2.00 cm   LV e' medial:    4.90 cm/s LV PW:         1.10 cm   LV E/e' medial:  11.0 LV IVS:        1.10 cm   LV e' lateral:   5.33 cm/s LVOT diam:     1.90 cm   LV E/e' lateral: 10.1 LV SV:         56 LV SV Index:   29 LVOT Area:     2.84 cm  RIGHT VENTRICLE             IVC RV Basal diam:  3.70 cm     IVC diam: 1.30 cm RV S prime:     17.05 cm/s TAPSE (M-mode): 2.4 cm LEFT ATRIUM             Index        RIGHT ATRIUM           Index LA diam:        3.70 cm 1.89 cm/m   RA Area:     12.00 cm LA Vol (A2C):   47.3 ml 24.22 ml/m  RA Volume:   32.50 ml  16.64 ml/m LA Vol (A4C):   26.2 ml 13.42 ml/m LA Biplane Vol: 35.3 ml 18.07 ml/m  AORTIC VALVE LVOT Vmax:   84.10 cm/s LVOT Vmean:  55.050 cm/s LVOT VTI:    0.198 m  AORTA Ao Root diam: 3.60 cm Ao Asc diam:  3.70 cm MITRAL VALVE               TRICUSPID VALVE MV Area (PHT): 2.29 cm    TR Peak grad:   14.4 mmHg MV Decel Time: 331 msec    TR Vmax:        190.00 cm/s MV E velocity: 53.75 cm/s MV A velocity: 94.60 cm/s  SHUNTS MV E/A ratio:  0.57        Systemic VTI:  0.20 m                            Systemic Diam: 1.90  cm Nelva Bush MD Electronically signed by Nelva Bush MD Signature Date/Time: 01/09/2023/6:52:46 AM    Final    MR BRAIN WO CONTRAST  Result Date: 01/08/2023 CLINICAL DATA:  Initial evaluation for neuro deficit, stroke suspected. EXAM: MRI HEAD WITHOUT CONTRAST TECHNIQUE: Multiplanar, multiecho pulse sequences of the brain and surrounding structures were obtained without intravenous contrast. COMPARISON:  Prior CTs from earlier the same day. FINDINGS: Brain: Examination degraded by motion artifact. Generalized age-related cerebral atrophy. Extensive patchy and confluent T2/FLAIR hyperintensity involving the periventricular and deep white matter both cerebral hemispheres as well as the pons, consistent with chronic small vessel  ischemic disease, severe in nature. Few tiny remote cerebellar infarcts noted. 1.2 cm focus of restricted diffusion involving the posterior limb of the left internal capsule, consistent with a acute ischemic infarct (series 5, image 24). No associated hemorrhage or mass effect. No other evidence for acute or subacute ischemia. Gray-white matter differentiation otherwise maintained. No acute intracranial hemorrhage. Few small chronic micro hemorrhages noted, likely small vessel related. No mass lesion, midline shift or mass effect. No hydrocephalus or extra-axial fluid collection. Pituitary gland and suprasellar region within normal limits. Vascular: Major intracranial vascular flow voids are maintained. Skull and upper cervical spine: Craniocervical junction within normal limits. Bone marrow signal intensity normal. No scalp soft tissue abnormality. Sinuses/Orbits: Prior bilateral ocular lens replacement. Paranasal sinuses are largely clear. Small bilateral mastoid effusions, of doubtful significance. Visualized nasopharynx unremarkable. Other: None. IMPRESSION: 1. 1.2 cm acute ischemic nonhemorrhagic infarct involving the posterior limb of the left internal capsule. 2. Underlying  age-related cerebral atrophy with severe chronic microvascular ischemic disease. Electronically Signed   By: Jeannine Boga M.D.   On: 01/08/2023 23:57   CT ANGIO HEAD NECK W WO CM  Result Date: 01/08/2023 CLINICAL DATA:  Initial evaluation for neuro deficit, stroke suspected. EXAM: CT ANGIOGRAPHY HEAD AND NECK TECHNIQUE: Multidetector CT imaging of the head and neck was performed using the standard protocol during bolus administration of intravenous contrast. Multiplanar CT image reconstructions and MIPs were obtained to evaluate the vascular anatomy. Carotid stenosis measurements (when applicable) are obtained utilizing NASCET criteria, using the distal internal carotid diameter as the denominator. RADIATION DOSE REDUCTION: This exam was performed according to the departmental dose-optimization program which includes automated exposure control, adjustment of the mA and/or kV according to patient size and/or use of iterative reconstruction technique. CONTRAST:  75 mL of Omnipaque 350. COMPARISON:  Prior CT from earlier the same day. FINDINGS: CTA NECK FINDINGS Aortic arch: Visualized aortic arch normal in caliber with standard 3 vessel morphology. Mild atheromatous change about the arch itself. No stenosis about the origin the great vessels. Right carotid system: Right common and internal carotid arteries are patent without dissection. Minimal atheromatous change about the right carotid bulb without hemodynamically significant stenosis. Left carotid system: Left common and internal carotid arteries are patent without dissection. Minimal atheromatous change about the left carotid bulb without hemodynamically significant stenosis. Vertebral arteries: Both vertebral arteries arise from the subclavian arteries. No proximal subclavian artery stenosis. Both vertebral arteries widely patent without stenosis, dissection or occlusion. Skeleton: No discrete or worrisome osseous lesions. Mild spondylosis at C4-5  through C6-7. Other neck: No other acute soft tissue abnormality within the neck. Upper chest: Few small pulmonary nodules measuring up to 5 mm are seen clustered within the peripheral left upper lobe (series 4, image 172), indeterminate. Visualized upper chest demonstrates no other acute finding. Review of the MIP images confirms the above findings CTA HEAD FINDINGS Anterior circulation: Mild atheromatous change within the carotid siphons without stenosis. 2 mm outpouching extending inferiorly from the supraclinoid left ICA felt to be most consistent with a small vascular infundibulum. Left A1 segment widely patent. Right A1 hypoplastic and/or absent. Normal anterior communicating artery complex. Atheromatous irregularity throughout the anterior cerebral arteries without proximal high-grade stenosis. Atheromatous change seen within both M1 segments as well with no more than mild multifocal narrowing. No proximal MCA branch occlusion or high-grade stenosis. Distal MCA branches well perfused, although demonstrates small vessel atheromatous irregularity. Posterior circulation: Both V4 segments patent without stenosis. Left PICA patent. Right PICA origin  not well seen. Basilar mildly irregular but patent without high-grade stenosis. Superior cerebellar arteries patent bilaterally. Left PCA supplied via the basilar. Fetal type origin of the right PCA. Both PCAs patent to their distal aspects without significant stenosis. Venous sinuses: Grossly patent allowing for timing the contrast bolus. Anatomic variants: As above.  No aneurysm. Review of the MIP images confirms the above findings IMPRESSION: 1. Negative CTA for large vessel occlusion or other emergent finding. 2. Moderate intracranial atherosclerotic disease without proximal high-grade or correctable stenosis. 3. Mild atheromatous change about the carotid bifurcations without stenosis. No hemodynamically significant stenosis within the neck. 4. Few small pulmonary  nodules measuring up to 5 mm clustered within the peripheral left upper lobe, indeterminate. Per Fleischner Society Guidelines, if patient is low risk for malignancy, no routine follow-up imaging is recommended. If patient is high risk for malignancy, a non-contrast Chest CT at 12 months is optional. If performed and the nodule is stable at 12 months, no further follow-up is recommended. These guidelines do not apply to immunocompromised patients and patients with cancer. Follow up in patients with significant comorbidities as clinically warranted. For lung cancer screening, adhere to Lung-RADS guidelines. Reference: Radiology. 2017; 284(1):228-43. Electronically Signed   By: Jeannine Boga M.D.   On: 01/08/2023 20:12   CT HEAD CODE STROKE WO CONTRAST  Result Date: 01/08/2023 CLINICAL DATA:  Code stroke. Neuro deficit, acute, stroke suspected. Acute onset slurred speech. History of dementia. History of prior stroke with foot drop. EXAM: CT HEAD WITHOUT CONTRAST TECHNIQUE: Contiguous axial images were obtained from the base of the skull through the vertex without intravenous contrast. RADIATION DOSE REDUCTION: This exam was performed according to the departmental dose-optimization program which includes automated exposure control, adjustment of the mA and/or kV according to patient size and/or use of iterative reconstruction technique. COMPARISON:  Head CT 04/12/2022. FINDINGS: Brain: Mild generalized parenchymal atrophy. Patchy and ill-defined hypoattenuation within the cerebral white matter, nonspecific but compatible with advanced chronic small vessel ischemic disease. Chronic small vessel ischemic changes are also present within the bilateral deep gray nuclei. There is no acute intracranial hemorrhage. No demarcated cortical infarct. No extra-axial fluid collection. No evidence of an intracranial mass. No midline shift. Vascular: No hyperdense vessel.  Atherosclerotic calcifications. Skull: No fracture  or aggressive osseous lesion. Sinuses/Orbits: No mass or acute finding within the imaged orbits. No significant paranasal sinus disease at the imaged levels. ASPECTS [facility] Stroke Program Early CT Score) - Ganglionic level infarction (caudate, lentiform nuclei, internal capsule, insula, M1-M3 cortex): 7 - Supraganglionic infarction (M4-M6 cortex): 3 Total score (0-10 with 10 being normal): 10 No evidence of an acute intracranial abnormality. These results were communicated to Dr. Cheral Marker at 11:32 amon 3/19/2024by text page via the [facility] messaging system. IMPRESSION: 1.  No evidence of an acute intracranial abnormality. 2. Parenchymal atrophy and chronic small vessel disease, as described. Electronically Signed   By: Kellie Simmering D.O.   On: 01/08/2023 11:32    Microbiology: Results for orders placed or performed during the hospital encounter of 01/11/21  Resp Panel by RT-PCR (Flu A&B, Covid) Nasopharyngeal Swab     Status: None   Collection Time: 01/11/21 10:41 AM   Specimen: Nasopharyngeal Swab; Nasopharyngeal(NP) swabs in vial transport medium  Result Value Ref Range Status   SARS Coronavirus 2 by RT PCR NEGATIVE NEGATIVE Final    Comment: (NOTE) SARS-CoV-2 target nucleic acids are NOT DETECTED.  The SARS-CoV-2 RNA is generally detectable in upper respiratory specimens during the acute  phase of infection. The lowest concentration of SARS-CoV-2 viral copies this assay can detect is 138 copies/mL. A negative result does not preclude SARS-Cov-2 infection and should not be used as the sole basis for treatment or other patient management decisions. A negative result may occur with  improper specimen collection/handling, submission of specimen other than nasopharyngeal swab, presence of viral mutation(s) within the areas targeted by this assay, and inadequate number of viral copies(<138 copies/mL). A negative result must be combined with clinical observations, patient history, and  epidemiological information. The expected result is Negative.  Fact Sheet for Patients:  EntrepreneurPulse.com.au  Fact Sheet for Healthcare Providers:  IncredibleEmployment.be  This test is no t yet approved or cleared by the [place] FDA and  has been authorized for detection and/or diagnosis of SARS-CoV-2 by FDA under an Emergency Use Authorization (EUA). This EUA will remain  in effect (meaning this test can be used) for the duration of the COVID-19 declaration under Section 564(b)(1) of the Act, 21 U.S.C.section 360bbb-3(b)(1), unless the authorization is terminated  or revoked sooner.       Influenza A by PCR NEGATIVE NEGATIVE Final   Influenza B by PCR NEGATIVE NEGATIVE Final    Comment: (NOTE) The Xpert Xpress SARS-CoV-2/FLU/RSV plus assay is intended as an aid in the diagnosis of influenza from Nasopharyngeal swab specimens and should not be used as a sole basis for treatment. Nasal washings and aspirates are unacceptable for Xpert Xpress SARS-CoV-2/FLU/RSV testing.  Fact Sheet for Patients: EntrepreneurPulse.com.au  Fact Sheet for Healthcare Providers: IncredibleEmployment.be  This test is not yet approved or cleared by the [place] FDA and has been authorized for detection and/or diagnosis of SARS-CoV-2 by FDA under an Emergency Use Authorization (EUA). This EUA will remain in effect (meaning this test can be used) for the duration of the COVID-19 declaration under Section 564(b)(1) of the Act, 21 U.S.C. section 360bbb-3(b)(1), unless the authorization is terminated or revoked.  Performed at [facility], [place], [place] [address]   SARS CORONAVIRUS 2 (TAT 6-24 HRS) Nasopharyngeal Nasopharyngeal Swab     Status: None   Collection Time: 01/16/21 12:36 PM   Specimen: Nasopharyngeal Swab  Result Value Ref Range Status   SARS Coronavirus 2 NEGATIVE  NEGATIVE Final    Comment: (NOTE) SARS-CoV-2 target nucleic acids are NOT DETECTED.  The SARS-CoV-2 RNA is generally detectable in upper and lower respiratory specimens during the acute phase of infection. Negative results do not preclude SARS-CoV-2 infection, do not rule out co-infections with other pathogens, and should not be used as the sole basis for treatment or other patient management decisions. Negative results must be combined with clinical observations, patient history, and epidemiological information. The expected result is Negative.  Fact Sheet for Patients: SugarRoll.be  Fact Sheet for Healthcare Providers: https://www.woods-mathews.com/  This test is not yet approved or cleared by the [place] FDA and  has been authorized for detection and/or diagnosis of SARS-CoV-2 by FDA under an Emergency Use Authorization (EUA). This EUA will remain  in effect (meaning this test can be used) for the duration of the COVID-19 declaration under Se ction 564(b)(1) of the Act, 21 U.S.C. section 360bbb-3(b)(1), unless the authorization is terminated or revoked sooner.  Performed at [facility], [place] [address], [place], [place] [address]     Patient is a DNR.  Discharge time spent: greater than 30 minutes.  Signed: Loletha Grayer, MD [facility] 01/17/2023

## 2023-01-17 NOTE — TOC Transition Note (Addendum)
Transition of Care [facility]) - CM/SW Discharge Note   Patient Details  Name: Bethany Hicks MRN: [id number] Date of Birth: [birth year]  [facility] CM/SW Contact:  Gerilyn Pilgrim, LCSW Phone Number: 01/17/2023, 12:44 PM   Clinical Narrative:   Pt discharging to [place] living with [facility] hospice. Hospital bed delivered confirmed by Maudie Mercury at [facility]. RN given number for report and medical necessity printed to the unit. Daughter notified.           Patient Goals and CMS Choice      Discharge Placement                         Discharge Plan and Services Additional resources added to the After Visit Summary for                                       Social Determinants of Health (SDOH) Interventions SDOH Screenings   Food Insecurity: Patient Unable To Answer (01/09/2023)  Tobacco Use: Medium Risk (01/09/2023)     Readmission Risk Interventions     No data to display

## 2023-01-21 DEATH — deceased

## 2023-04-16 IMAGING — DX DG ANKLE COMPLETE 3+V*R*
3 series · 3 of 3 positions shown · non-contrast
Comparison: 01/11/2021

CLINICAL DATA: ORIF ankle fractures

EXAM:
RIGHT ANKLE - COMPLETE 3+ VIEW

[ankle ap]
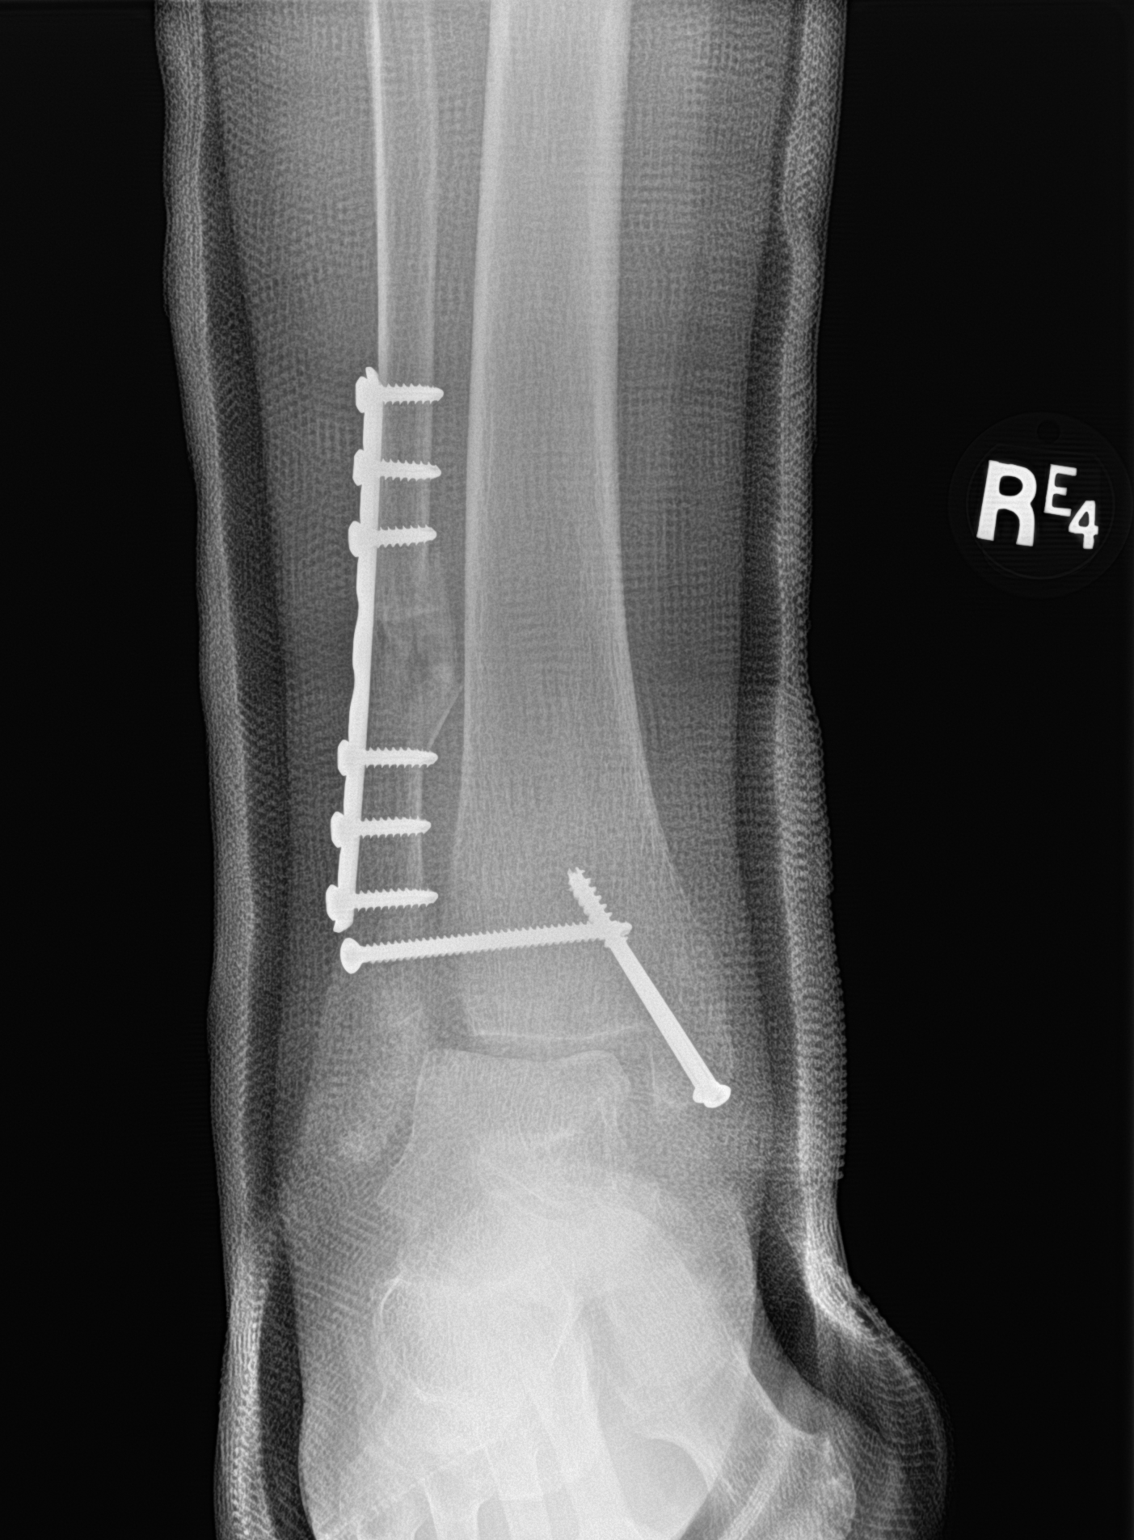

[ankle obl]
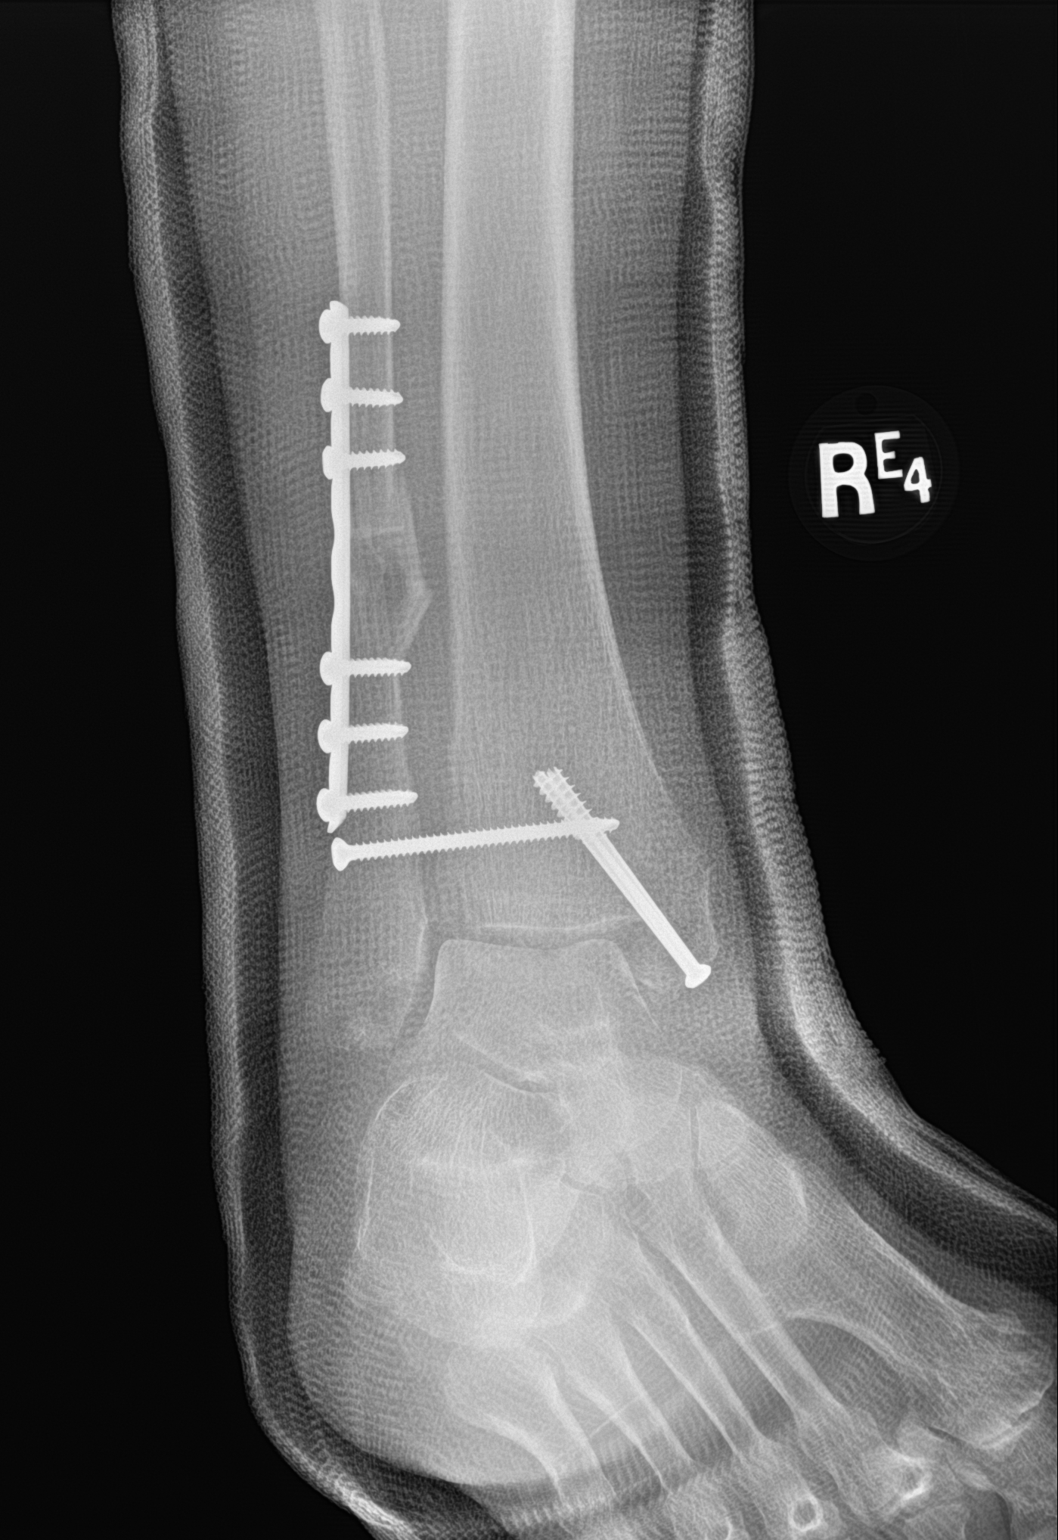

[ankle lat]
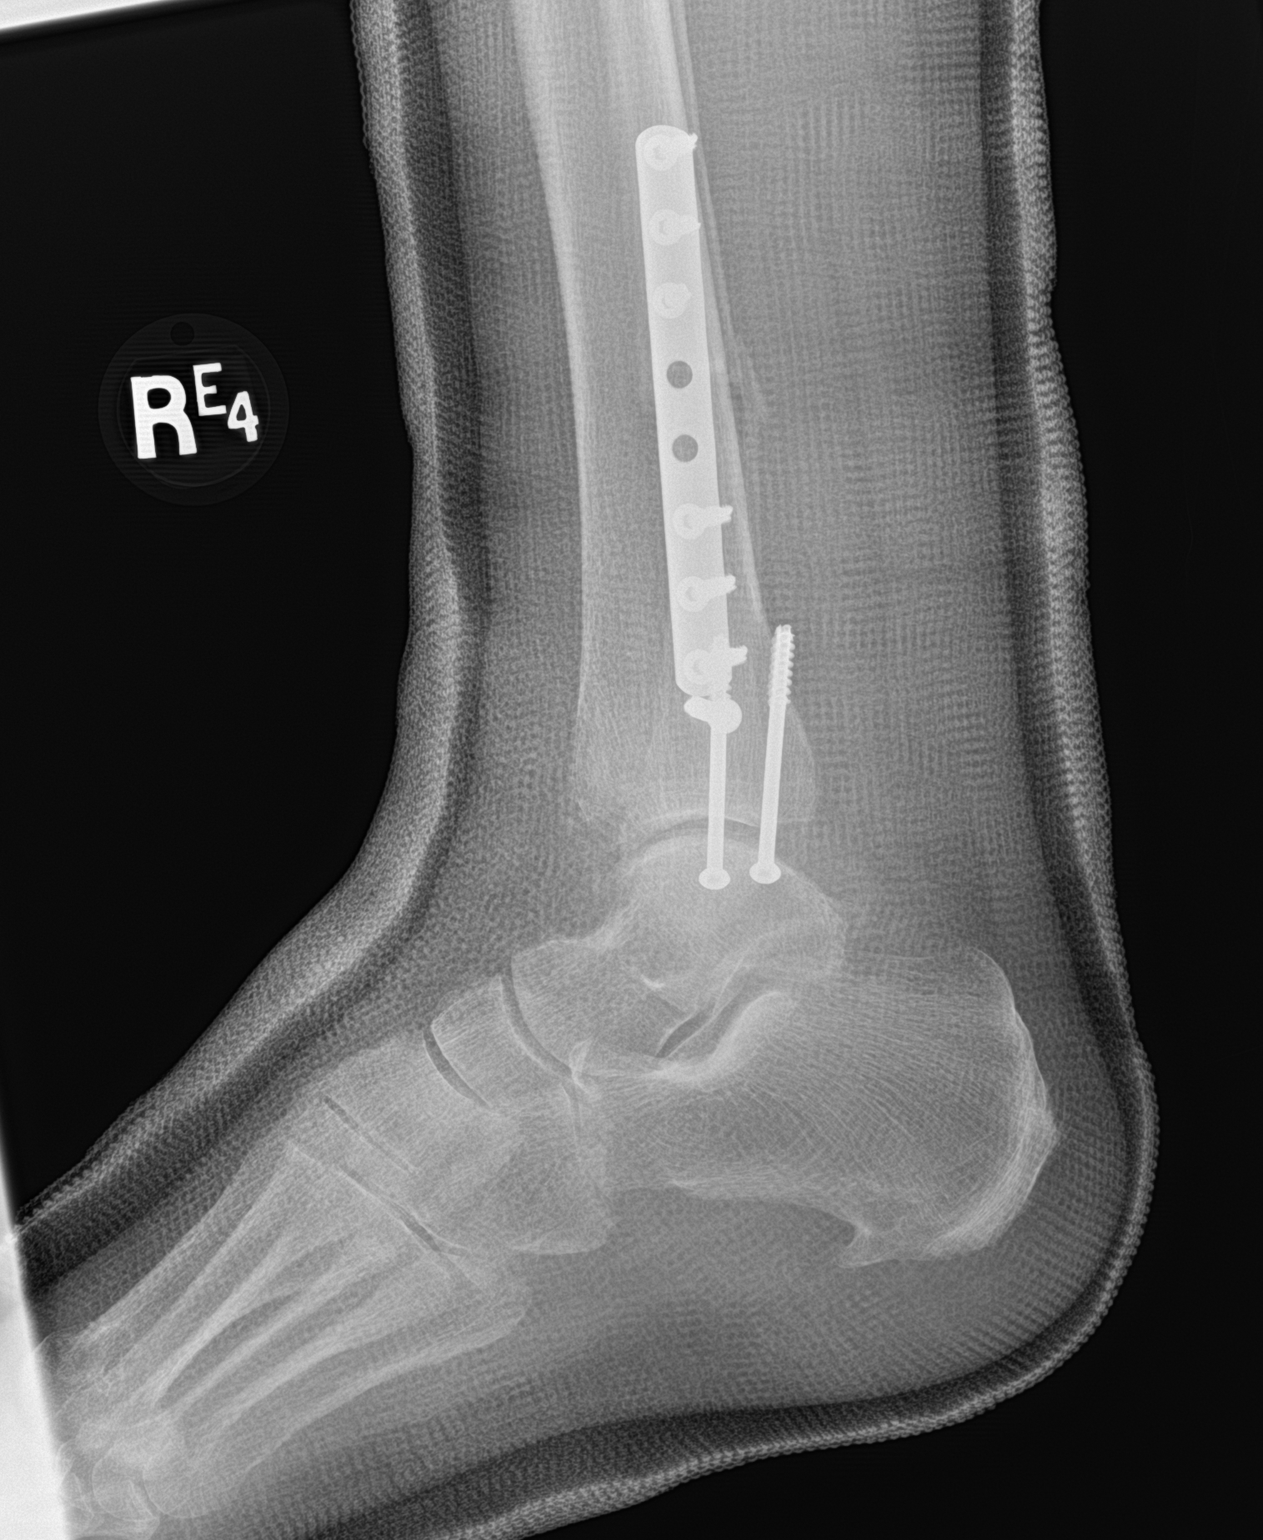

[3 of 3 positions shown; findings below may reference images not displayed]

FINDINGS: Fiberglass cast material obscures bone detail.

Two cannulated screws across a reduced medial malleolar fracture.

Plate and 6 screws across a reduced distal RIGHT fibular diaphyseal
fracture.

Joint spaces preserved.

No additional fracture or dislocation.

Large plantar calcaneal spur.
IMPRESSION: Prior medial malleolar and distal fibular ORIF.

No acute osseous abnormalities.
